# Patient Record
Sex: Male | Born: 1965 | Race: White | Hispanic: No | Marital: Married | Smoking: Current every day smoker
Health system: Southern US, Community
[De-identification: ages and names within clinical notes are randomized; demographics above are authoritative.]

## PROBLEM LIST (undated history)

## (undated) DIAGNOSIS — I10 Essential (primary) hypertension: Secondary | ICD-10-CM

## (undated) DIAGNOSIS — Z6831 Body mass index (BMI) 31.0-31.9, adult: Secondary | ICD-10-CM

## (undated) DIAGNOSIS — R079 Chest pain, unspecified: Secondary | ICD-10-CM

## (undated) DIAGNOSIS — R600 Localized edema: Secondary | ICD-10-CM

## (undated) DIAGNOSIS — I5030 Unspecified diastolic (congestive) heart failure: Secondary | ICD-10-CM

## (undated) DIAGNOSIS — I679 Cerebrovascular disease, unspecified: Secondary | ICD-10-CM

## (undated) DIAGNOSIS — L409 Psoriasis, unspecified: Secondary | ICD-10-CM

## (undated) DIAGNOSIS — F419 Anxiety disorder, unspecified: Secondary | ICD-10-CM

## (undated) DIAGNOSIS — M545 Low back pain, unspecified: Secondary | ICD-10-CM

## (undated) DIAGNOSIS — R001 Bradycardia, unspecified: Secondary | ICD-10-CM

## (undated) DIAGNOSIS — G4700 Insomnia, unspecified: Secondary | ICD-10-CM

## (undated) DIAGNOSIS — N400 Enlarged prostate without lower urinary tract symptoms: Secondary | ICD-10-CM

## (undated) DIAGNOSIS — G8929 Other chronic pain: Secondary | ICD-10-CM

## (undated) DIAGNOSIS — R55 Syncope and collapse: Secondary | ICD-10-CM

## (undated) DIAGNOSIS — I480 Paroxysmal atrial fibrillation: Secondary | ICD-10-CM

## (undated) DIAGNOSIS — M5090 Cervical disc disorder, unspecified, unspecified cervical region: Secondary | ICD-10-CM

## (undated) DIAGNOSIS — F3341 Major depressive disorder, recurrent, in partial remission: Secondary | ICD-10-CM

## (undated) HISTORY — DX: Essential (primary) hypertension: I10

## (undated) HISTORY — DX: Other chronic pain: G89.29

## (undated) HISTORY — DX: Anxiety disorder, unspecified: F41.9

## (undated) HISTORY — PX: HERNIA REPAIR: SHX51

## (undated) HISTORY — DX: Major depressive disorder, recurrent, in partial remission: F33.41

## (undated) HISTORY — PX: APPENDECTOMY: SHX54

## (undated) HISTORY — DX: Unspecified diastolic (congestive) heart failure: I50.30

## (undated) HISTORY — DX: Insomnia, unspecified: G47.00

## (undated) HISTORY — DX: Paroxysmal atrial fibrillation: I48.0

## (undated) HISTORY — DX: Low back pain, unspecified: M54.50

## (undated) HISTORY — PX: CHOLECYSTECTOMY: SHX55

## (undated) HISTORY — PX: WRIST SURGERY: SHX841

## (undated) HISTORY — DX: Psoriasis, unspecified: L40.9

## (undated) HISTORY — DX: Benign prostatic hyperplasia without lower urinary tract symptoms: N40.0

## (undated) HISTORY — DX: Body mass index (BMI) 31.0-31.9, adult: Z68.31

## (undated) HISTORY — DX: Localized edema: R60.0

## (undated) HISTORY — PX: KNEE ARTHROSCOPY: SUR90

## (undated) HISTORY — DX: Cerebrovascular disease, unspecified: I67.9

## (undated) HISTORY — PX: CERVICAL LAMINECTOMY: SHX94

---

## 1998-07-12 ENCOUNTER — Ambulatory Visit (HOSPITAL_COMMUNITY): Admission: RE | Admit: 1998-07-12 | Discharge: 1998-07-12 | Payer: Self-pay | Admitting: Gastroenterology

## 2009-10-16 HISTORY — PX: CARDIAC CATHETERIZATION: SHX172

## 2013-04-23 DIAGNOSIS — Z872 Personal history of diseases of the skin and subcutaneous tissue: Secondary | ICD-10-CM

## 2013-04-23 DIAGNOSIS — L4050 Arthropathic psoriasis, unspecified: Secondary | ICD-10-CM

## 2013-04-23 DIAGNOSIS — F17200 Nicotine dependence, unspecified, uncomplicated: Secondary | ICD-10-CM

## 2013-04-23 DIAGNOSIS — Z8739 Personal history of other diseases of the musculoskeletal system and connective tissue: Secondary | ICD-10-CM | POA: Insufficient documentation

## 2013-04-23 DIAGNOSIS — R002 Palpitations: Secondary | ICD-10-CM | POA: Insufficient documentation

## 2013-04-23 DIAGNOSIS — I4891 Unspecified atrial fibrillation: Secondary | ICD-10-CM

## 2013-04-23 HISTORY — DX: Arthropathic psoriasis, unspecified: L40.50

## 2013-04-23 HISTORY — DX: Nicotine dependence, unspecified, uncomplicated: F17.200

## 2013-04-25 ENCOUNTER — Ambulatory Visit (INDEPENDENT_AMBULATORY_CARE_PROVIDER_SITE_OTHER): Payer: BC Managed Care – PPO | Admitting: Cardiology

## 2013-04-25 ENCOUNTER — Encounter: Payer: Self-pay | Admitting: Cardiology

## 2013-04-25 VITALS — BP 126/78 | HR 62 | Ht 72.0 in | Wt 195.8 lb

## 2013-04-25 DIAGNOSIS — I4891 Unspecified atrial fibrillation: Secondary | ICD-10-CM

## 2013-04-25 DIAGNOSIS — F17200 Nicotine dependence, unspecified, uncomplicated: Secondary | ICD-10-CM

## 2013-04-25 MED ORDER — DILTIAZEM HCL ER COATED BEADS 240 MG PO CP24: 240.0000 mg | ORAL_CAPSULE | Freq: Every day | ORAL | Status: DC

## 2013-04-25 NOTE — Patient Instructions (Signed)
Avoid caffeine and decongestants (pseudophedrine)  Stop metoprolol.  Start cardizem CD 240 mg daily.  I will see you in a couple of months.

## 2013-04-25 NOTE — Progress Notes (Signed)
Wayne Pollard Date of Birth: 1966/05/20 Medical Record #[id number]  History of Present Illness: Wayne Pollard is self referred for evaluation of atrial fibrillation. He is a pleasant 47 year old white male who reports a one-year history of arrhythmia. He states he feels his heart racing and beating irregular. If he strains real hard several times it will go away. Typically his arrhythmia last several minutes up to 20 minutes. He did have an episode in April that lasted 24 hours and he was hospitalized. According to notes from his cardiologist in [place] his echocardiogram was unremarkable. Patient had had a cardiac catheterization a couple of years ago which was unremarkable. He was initially treated with beta blocker therapy. Last week he started on flecainide 50 mg twice a day. Since starting on this medication he has had only one fleeting episode of arrhythmia. He does feel very fatigued on metoprolol. Patient admits that he is very anxious concerning his rhythm. He has no known history of hypertension, diabetes, vascular disease, or stroke. [place] score is 0.  Current Outpatient Prescriptions on File Prior to Visit  Medication Sig Dispense Refill  . ALPRAZolam (XANAX) 0.5 MG tablet Take 0.5 mg by mouth 2 (two) times daily.      Marland Kitchen aspirin 325 MG tablet Take 325 mg by mouth daily.      . flecainide (TAMBOCOR) 50 MG tablet Take 50 mg by mouth 2 (two) times daily.      Marland Kitchen omeprazole (PRILOSEC OTC) 20 MG tablet Take 20 mg by mouth 2 (two) times daily.      . temazepam (RESTORIL) 15 MG capsule Take 15 mg by mouth at bedtime as needed for sleep.       No current facility-administered medications on file prior to visit.    Allergies  Allergen Reactions  . Codeine     Past Medical History  Diagnosis Date  . Anxiety   . Paroxysmal atrial fibrillation     Past Surgical History  Procedure Laterality Date  . Cardiac catheterization    . Appendectomy    . Cholecystectomy    . Hernia repair       History  Smoking status  . Former Smoker  . Quit date: 12/24/2012  Smokeless tobacco  . Not on file    History  Alcohol Use No    Family History  Problem Relation Age of Onset  . Hypertension Mother   . Heart attack Father   . Heart failure Father     Review of Systems: The review of systems is positive for fatigue. He does complain of anxiety. All other systems were reviewed and are negative.  Physical Exam: BP 126/78  Pulse 62  Ht 6' (1.829 m)  Wt 195 lb 12.8 oz (88.814 kg)  BMI 26.55 kg/m2 He is a pleasant white male in no acute distress. HEENT: Normocephalic, atraumatic. Pupils equal round and reactive to light accommodation. Extraocular movements are full. Oropharynx is clear with good dentition. Neck is supple no JVD, adenopathy, thyromegaly, or bruits. Lungs: Clear Cardiovascular: Regular rate and rhythm. Normal S1 and S2. No gallop or click. He has a soft grade 2/6 systolic ejection murmur heard best at the left sternal border and apex. PMI is normal. Abdomen: Soft and nontender. No masses organosplenomegaly. No bruits. Bowel sounds positive. Extremities: No cyanosis or edema. Pulses are 2+ and symmetric. Skin: Warm and dry Neuro: Alert and oriented x3. Cranial nerves II through XII are intact.  LABORATORY DATA: ECG today demonstrates normal sinus rhythm  with occasional PVC. Right atrial enlargement. Cannot rule out septal infarct age undetermined.  Assessment / Plan: 1. Paroxysmal atrial fibrillation. Apparently prior evaluation with lab work and echocardiogram were unremarkable. He does have a soft systolic ejection murmur. We will get a copy of his prior records for review. Since he has significant fatigue with metoprolol we will switch this to diltiazem 240 mg per day. I recommended continuing flecainide 50 mg twice a day. I recommended avoidance of stimulants including tobacco, caffeine, and decongestants. I'll followup again in 3 months to see how he's  doing on his antiarrhythmic drug therapy. We discussed that if he does have significant breakthrough he might be a candidate for ablative therapy.

## 2013-04-28 ENCOUNTER — Telehealth: Payer: Self-pay | Admitting: Cardiology

## 2013-04-28 NOTE — Telephone Encounter (Signed)
Pt Signed ROI Faxed to  1.[facility] [phone] 2.[facility] [phone] 7/14/4/KM

## 2013-04-29 ENCOUNTER — Telehealth: Payer: Self-pay | Admitting: Cardiology

## 2013-04-29 NOTE — Telephone Encounter (Signed)
Records Rec From [facility] gave to [facility]  04/29/13/KM

## 2013-05-21 ENCOUNTER — Telehealth: Payer: Self-pay | Admitting: Cardiology

## 2013-05-21 NOTE — Telephone Encounter (Signed)
Records REc From [facility], gave to [facility] 05/21/13/KM

## 2013-06-19 ENCOUNTER — Inpatient Hospital Stay (HOSPITAL_COMMUNITY): Payer: BC Managed Care – PPO

## 2013-06-19 ENCOUNTER — Observation Stay (HOSPITAL_COMMUNITY)
Admission: EM | Admit: 2013-06-19 | Discharge: 2013-06-20 | Disposition: A | Discharge status: Home / Self Care | Payer: BC Managed Care – PPO | Attending: Internal Medicine | Admitting: Internal Medicine

## 2013-06-19 ENCOUNTER — Encounter (HOSPITAL_COMMUNITY): Payer: Self-pay | Admitting: *Deleted

## 2013-06-19 ENCOUNTER — Emergency Department (HOSPITAL_COMMUNITY): Payer: BC Managed Care – PPO

## 2013-06-19 DIAGNOSIS — I4891 Unspecified atrial fibrillation: Principal | ICD-10-CM | POA: Insufficient documentation

## 2013-06-19 DIAGNOSIS — I498 Other specified cardiac arrhythmias: Secondary | ICD-10-CM | POA: Insufficient documentation

## 2013-06-19 DIAGNOSIS — R001 Bradycardia, unspecified: Secondary | ICD-10-CM

## 2013-06-19 DIAGNOSIS — R079 Chest pain, unspecified: Secondary | ICD-10-CM | POA: Insufficient documentation

## 2013-06-19 DIAGNOSIS — F17200 Nicotine dependence, unspecified, uncomplicated: Secondary | ICD-10-CM

## 2013-06-19 DIAGNOSIS — R0602 Shortness of breath: Secondary | ICD-10-CM | POA: Insufficient documentation

## 2013-06-19 DIAGNOSIS — R0789 Other chest pain: Secondary | ICD-10-CM | POA: Insufficient documentation

## 2013-06-19 DIAGNOSIS — R55 Syncope and collapse: Secondary | ICD-10-CM

## 2013-06-19 DIAGNOSIS — R002 Palpitations: Secondary | ICD-10-CM | POA: Diagnosis present

## 2013-06-19 DIAGNOSIS — I517 Cardiomegaly: Secondary | ICD-10-CM

## 2013-06-19 HISTORY — DX: Chest pain, unspecified: R07.9

## 2013-06-19 LAB — HEPATIC FUNCTION PANEL
ALT: 11 U/L (ref 0–53)
Albumin: 3.6 g/dL (ref 3.5–5.2)
Alkaline Phosphatase: 66 U/L (ref 39–117)
Total Bilirubin: 0.6 mg/dL (ref 0.3–1.2)
Total Protein: 6.9 g/dL (ref 6.0–8.3)

## 2013-06-19 LAB — CBC WITH DIFFERENTIAL/PLATELET
Eosinophils Absolute: 0.1 10*3/uL (ref 0.0–0.7)
Eosinophils Relative: 1 % (ref 0–5)
HCT: 41.4 % (ref 39.0–52.0)
Hemoglobin: 14.9 g/dL (ref 13.0–17.0)
Lymphocytes Relative: 19 % (ref 12–46)
Lymphs Abs: 1.8 10*3/uL (ref 0.7–4.0)
MCH: 32.3 pg (ref 26.0–34.0)
MCV: 89.6 fL (ref 78.0–100.0)
Monocytes Absolute: 0.5 10*3/uL (ref 0.1–1.0)
Monocytes Relative: 5 % (ref 3–12)
RBC: 4.62 MIL/uL (ref 4.22–5.81)
WBC: 9.3 10*3/uL (ref 4.0–10.5)

## 2013-06-19 LAB — CBC
MCH: 32.5 pg (ref 26.0–34.0)
MCV: 89.8 fL (ref 78.0–100.0)
Platelets: 175 10*3/uL (ref 150–400)
RDW: 12.4 % (ref 11.5–15.5)
WBC: 10.1 10*3/uL (ref 4.0–10.5)

## 2013-06-19 LAB — POCT I-STAT TROPONIN I: Troponin i, poc: 0 ng/mL (ref 0.00–0.08)

## 2013-06-19 LAB — BASIC METABOLIC PANEL
BUN: 11 mg/dL (ref 6–23)
CO2: 25 mEq/L (ref 19–32)
Calcium: 9.2 mg/dL (ref 8.4–10.5)
GFR calc non Af Amer: >90 mL/min (ref >90)
Glucose, Bld: 90 mg/dL (ref 70–99)

## 2013-06-19 LAB — TROPONIN I
Troponin I: <0.3 ng/mL (ref <0.30)
Troponin I: <0.3 ng/mL (ref <0.30)

## 2013-06-19 MED ORDER — ALPRAZOLAM 0.25 MG PO TABS
0.5000 mg | ORAL_TABLET | Freq: Two times a day (BID) | ORAL | Status: DC | PRN
  Administered 2013-06-19 – 2013-06-20 (×2): 0.5 mg via ORAL
  Filled 2013-06-19 (×2): qty 2

## 2013-06-19 MED ORDER — TEMAZEPAM 15 MG PO CAPS
15.0000 mg | ORAL_CAPSULE | Freq: Every evening | ORAL | Status: DC | PRN
  Administered 2013-06-19: 15 mg via ORAL
  Filled 2013-06-19: qty 1

## 2013-06-19 MED ORDER — SODIUM CHLORIDE 0.9 % IJ SOLN
3.0000 mL | Freq: Two times a day (BID) | INTRAMUSCULAR | Status: DC
  Administered 2013-06-19: 3 mL via INTRAVENOUS

## 2013-06-19 MED ORDER — ONDANSETRON HCL 4 MG/2ML IJ SOLN: 4.0000 mg | Freq: Four times a day (QID) | INTRAMUSCULAR | Status: DC | PRN

## 2013-06-19 MED ORDER — ONDANSETRON HCL 4 MG PO TABS: 4.0000 mg | ORAL_TABLET | Freq: Four times a day (QID) | ORAL | Status: DC | PRN

## 2013-06-19 MED ORDER — ASPIRIN 325 MG PO TABS
325.0000 mg | ORAL_TABLET | Freq: Every day | ORAL | Status: DC
  Filled 2013-06-19: qty 1

## 2013-06-19 MED ORDER — ACETAMINOPHEN 325 MG PO TABS
650.0000 mg | ORAL_TABLET | Freq: Four times a day (QID) | ORAL | Status: DC | PRN
  Administered 2013-06-20: 650 mg via ORAL
  Filled 2013-06-19: qty 2

## 2013-06-19 MED ORDER — NITROGLYCERIN 0.3 MG SL SUBL
0.3000 mg | SUBLINGUAL_TABLET | SUBLINGUAL | Status: DC | PRN
  Filled 2013-06-19: qty 100

## 2013-06-19 MED ORDER — MORPHINE SULFATE 2 MG/ML IJ SOLN: 1.0000 mg | INTRAMUSCULAR | Status: DC | PRN

## 2013-06-19 MED ORDER — FLECAINIDE ACETATE 50 MG PO TABS
50.0000 mg | ORAL_TABLET | Freq: Two times a day (BID) | ORAL | Status: DC
  Filled 2013-06-19 (×4): qty 1

## 2013-06-19 MED ORDER — MORPHINE SULFATE 4 MG/ML IJ SOLN
4.0000 mg | Freq: Once | INTRAMUSCULAR | Status: AC
  Administered 2013-06-19: 4 mg via INTRAVENOUS
  Filled 2013-06-19: qty 1

## 2013-06-19 MED ORDER — HYDROMORPHONE HCL PF 1 MG/ML IJ SOLN
1.0000 mg | INTRAMUSCULAR | Status: DC | PRN
  Administered 2013-06-19: 1 mg via INTRAVENOUS
  Filled 2013-06-19: qty 1

## 2013-06-19 MED ORDER — MAGNESIUM HYDROXIDE 400 MG/5ML PO SUSP: 30.0000 mL | Freq: Every day | ORAL | Status: DC | PRN

## 2013-06-19 MED ORDER — PANTOPRAZOLE SODIUM 40 MG PO TBEC
40.0000 mg | DELAYED_RELEASE_TABLET | Freq: Every day | ORAL | Status: DC
  Administered 2013-06-19 – 2013-06-20 (×2): 40 mg via ORAL
  Filled 2013-06-19 (×2): qty 1

## 2013-06-19 MED ORDER — ONDANSETRON HCL 4 MG/2ML IJ SOLN
4.0000 mg | Freq: Once | INTRAMUSCULAR | Status: AC
  Administered 2013-06-19: 4 mg via INTRAVENOUS
  Filled 2013-06-19: qty 2

## 2013-06-19 MED ORDER — ACETAMINOPHEN 650 MG RE SUPP: 650.0000 mg | Freq: Four times a day (QID) | RECTAL | Status: DC | PRN

## 2013-06-19 MED ORDER — ONDANSETRON HCL 4 MG/2ML IJ SOLN: 4.0000 mg | Freq: Three times a day (TID) | INTRAMUSCULAR | Status: DC | PRN

## 2013-06-19 MED ORDER — HEPARIN SODIUM (PORCINE) 5000 UNIT/ML IJ SOLN
5000.0000 [IU] | Freq: Three times a day (TID) | INTRAMUSCULAR | Status: DC
  Administered 2013-06-19 – 2013-06-20 (×2): 5000 [IU] via SUBCUTANEOUS
  Filled 2013-06-19 (×6): qty 1

## 2013-06-19 MED ORDER — DILTIAZEM HCL ER COATED BEADS 240 MG PO CP24
240.0000 mg | ORAL_CAPSULE | Freq: Every day | ORAL | Status: DC
  Administered 2013-06-19: 240 mg via ORAL
  Filled 2013-06-19 (×2): qty 1

## 2013-06-19 NOTE — Progress Notes (Addendum)
Bilateral carotid artery duplex completed.  1-39% ICA stenosis and vertebral artery flow is antegrade.

## 2013-06-19 NOTE — ED Notes (Signed)
Called CT and they were already on their way to pick up pt.

## 2013-06-19 NOTE — Progress Notes (Signed)
Utilization Review Completed.Wayne Pollard T9/01/2013

## 2013-06-19 NOTE — Consult Note (Signed)
Reason for Consult:Syncope, chest pain Referring Physician: Dr. Laural Benes Primary cardiologist: Dr. Peter Swaziland  Wayne Pollard is an 47 y.o. male.  HPI: This pleasant 47 year old gentleman was admitted this morning after experiencing severe left-sided chest pain while at work.  Early this morning when in bed he felt that his heart had gone out of rhythm.  He did a Valsalva and the heart rhythm returned to normal.  However he did not feel well as he prepared to go to work.  He felt tired.  After arriving at work he had sudden onset of severe left-sided chest discomfort.  He also felt that his heart had gone back out of rhythm.  Because of this he performed several hard intense Valsalva maneuvers.  He felt that the arrhythmia stopped but then the chest discomfort recurred and he started to do further Valsalva's and then became dizzy and had syncope.  He thinks that he was unconscious for about 5 minutes.  He awoke and was surrounded by EMS personnel.  No arrhythmia was documented at the time.  He was brought to the emergency room here.  He was found to be in normal sinus rhythm. His past cardiac history reveals that on 04/27/10 he was rushed to [facility] in [place] with chest pain and taken emergently to the catheter lab where he was found to have normal coronary arteries and an ejection fraction of 60%.  Dr. Woodfin Ganja was his cardiologist. On 01/06/13 the patient had an echocardiogram at [facility] in [place], [place].  The echocardiogram noted that he was in atrial fibrillation and that he had normal left ventricular function with ejection fraction of 60-65% with mild LVH.  There was mild left atrial enlargement.  He states that when he ago he had a stress test in [place] which was normal. The patient was seen self-referred in Dr. Theron Arista Jordan's office for the first time on 04/25/13.  At that time he gave a one-year history of arrhythmia.  He gives a history that he  does a hard of Valsalva maneuver for several times his rhythm will usually revert to normal.  Typically  his episodes of arrhythmia last several minutes up to 20 minutes in duration.  When he saw Dr. Swaziland on 04/25/13 he was complaining of fatigue which was attributed to his metoprolol and at that point he was switched from metoprolol to diltiazem 240 mg daily.  He had previously been on flecainide 50 mg twice a day and he has continued that. Past Medical History  Diagnosis Date  . Anxiety   . Paroxysmal atrial fibrillation     Past Surgical History  Procedure Laterality Date  . Appendectomy    . Cholecystectomy    . Hernia repair    . Cardiac catheterization      Family History  Problem Relation Age of Onset  . Hypertension Mother   . Heart attack Father   . Heart failure Father     Social History:  reports that he quit smoking about 5 months ago. His smoking use included Cigarettes. He smoked 0.00 packs per day. He does not have any smokeless tobacco history on file. He reports that he does not drink alcohol or use illicit drugs.  Allergies:  Allergies  Allergen Reactions  . Codeine     Medications:  Prior to Admission:  Prescriptions prior to admission  Medication Sig Dispense Refill  . ALPRAZolam (XANAX) 0.5 MG tablet Take 0.5 mg by mouth 2 (two) times  daily as needed.       Marland Kitchen aspirin 325 MG tablet Take 325 mg by mouth daily.      Marland Kitchen diltiazem (CARDIZEM CD) 240 MG 24 hr capsule Take 1 capsule (240 mg total) by mouth daily.  90 capsule  3  . flecainide (TAMBOCOR) 50 MG tablet Take 50 mg by mouth 2 (two) times daily.      Marland Kitchen omeprazole (PRILOSEC OTC) 20 MG tablet Take 20 mg by mouth 2 (two) times daily.      . temazepam (RESTORIL) 15 MG capsule Take 15 mg by mouth at bedtime as needed for sleep.        Results for orders placed during the hospital encounter of 06/19/13 (from the past 48 hour(s))  POCT I-STAT TROPONIN I     Status: None   Collection Time    06/19/13 10:16  AM      Result Value Range   Troponin i, poc 0.00  0.00 - 0.08 ng/mL   Comment 3            Comment: Due to the release kinetics of cTnI,     a negative result within the first hours     of the onset of symptoms does not rule out     myocardial infarction with certainty.     If myocardial infarction is still suspected,     repeat the test at appropriate intervals.  CBC WITH DIFFERENTIAL     Status: None   Collection Time    06/19/13 10:20 AM      Result Value Range   WBC 9.3  4.0 - 10.5 K/uL   RBC 4.62  4.22 - 5.81 MIL/uL   Hemoglobin 14.9  13.0 - 17.0 g/dL   HCT 16.1  09.6 - 04.5 %   MCV 89.6  78.0 - 100.0 fL   MCH 32.3  26.0 - 34.0 pg   MCHC 36.0  30.0 - 36.0 g/dL   RDW 40.9  81.1 - 91.4 %   Platelets 172  150 - 400 K/uL   Neutrophils Relative % 75  43 - 77 %   Neutro Abs 7.0  1.7 - 7.7 K/uL   Lymphocytes Relative 19  12 - 46 %   Lymphs Abs 1.8  0.7 - 4.0 K/uL   Monocytes Relative 5  3 - 12 %   Monocytes Absolute 0.5  0.1 - 1.0 K/uL   Eosinophils Relative 1  0 - 5 %   Eosinophils Absolute 0.1  0.0 - 0.7 K/uL   Basophils Relative 0  0 - 1 %   Basophils Absolute 0.0  0.0 - 0.1 K/uL  BASIC METABOLIC PANEL     Status: None   Collection Time    06/19/13 10:20 AM      Result Value Range   Sodium 137  135 - 145 mEq/L   Potassium 3.8  3.5 - 5.1 mEq/L   Chloride 104  96 - 112 mEq/L   CO2 25  19 - 32 mEq/L   Glucose, Bld 90  70 - 99 mg/dL   BUN 11  6 - 23 mg/dL   Creatinine, Ser 7.82  0.50 - 1.35 mg/dL   Calcium 9.2  8.4 - 95.6 mg/dL   GFR calc non Af Amer >90  >90 mL/min   GFR calc Af Amer >90  >90 mL/min   Comment: (NOTE)     The eGFR has been calculated using the CKD EPI equation.  This calculation has not been validated in all clinical situations.     eGFR's persistently <90 mL/min signify possible Chronic Kidney     Disease.  PRO B NATRIURETIC PEPTIDE     Status: None   Collection Time    06/19/13 10:20 AM      Result Value Range   Pro B Natriuretic peptide  (BNP) 54.5  0 - 125 pg/mL  HEPATIC FUNCTION PANEL     Status: None   Collection Time    06/19/13 10:20 AM      Result Value Range   Total Protein 6.9  6.0 - 8.3 g/dL   Albumin 3.6  3.5 - 5.2 g/dL   AST 13  0 - 37 U/L   ALT 11  0 - 53 U/L   Alkaline Phosphatase 66  39 - 117 U/L   Total Bilirubin 0.6  0.3 - 1.2 mg/dL   Bilirubin, Direct <0.9  0.0 - 0.3 mg/dL   Comment: REPEATED TO VERIFY   Indirect Bilirubin NOT CALCULATED  0.3 - 0.9 mg/dL  POCT I-STAT TROPONIN I     Status: None   Collection Time    06/19/13  2:42 PM      Result Value Range   Troponin i, poc 0.01  0.00 - 0.08 ng/mL   Comment 3            Comment: Due to the release kinetics of cTnI,     a negative result within the first hours     of the onset of symptoms does not rule out     myocardial infarction with certainty.     If myocardial infarction is still suspected,     repeat the test at appropriate intervals.  CBC     Status: Abnormal   Collection Time    06/19/13  4:08 PM      Result Value Range   WBC 10.1  4.0 - 10.5 K/uL   RBC 4.59  4.22 - 5.81 MIL/uL   Hemoglobin 14.9  13.0 - 17.0 g/dL   HCT 60.4  54.0 - 98.1 %   MCV 89.8  78.0 - 100.0 fL   MCH 32.5  26.0 - 34.0 pg   MCHC 36.2 (*) 30.0 - 36.0 g/dL   RDW 19.1  47.8 - 29.5 %   Platelets 175  150 - 400 K/uL  CREATININE, SERUM     Status: None   Collection Time    06/19/13  4:08 PM      Result Value Range   Creatinine, Ser 0.79  0.50 - 1.35 mg/dL   GFR calc non Af Amer >90  >90 mL/min   GFR calc Af Amer >90  >90 mL/min   Comment: (NOTE)     The eGFR has been calculated using the CKD EPI equation.     This calculation has not been validated in all clinical situations.     eGFR's persistently <90 mL/min signify possible Chronic Kidney     Disease.  TROPONIN I     Status: None   Collection Time    06/19/13  4:12 PM      Result Value Range   Troponin I <0.30  <0.30 ng/mL   Comment:            Due to the release kinetics of cTnI,     a negative  result within the first hours     of the onset of symptoms does not rule out  myocardial infarction with certainty.     If myocardial infarction is still suspected,     repeat the test at appropriate intervals.    Dg Chest 2 View  06/19/2013   *RADIOLOGY REPORT*  Clinical Data: Cardiac palpitations  CHEST - 2 VIEW  Comparison:  January 06, 2013  Findings: There is no edema or consolidation.  Heart size and pulmonary vascularity are normal.  No adenopathy.  No bone lesions.  IMPRESSION: No abnormality noted.   Original Report Authenticated By: Bretta Bang, M.D.   Ct Head Wo Contrast  06/19/2013   *RADIOLOGY REPORT*  Clinical Data: Palpitations, syncope  CT HEAD WITHOUT CONTRAST  Technique:  Contiguous axial images were obtained from the base of the skull through the vertex without contrast.  Comparison: Prior head CT 05/31/2004  Findings: No acute intracranial hemorrhage, acute infarction, mass lesion, mass effect, midline shift or hydrocephalus.  Gray-white differentiation is preserved throughout. No focal soft tissue or calvarial abnormality.  The globes and orbits are symmetric and unremarkable bilaterally.  Normal aeration of the mastoid air cells and the majority the paranasal sinuses.  Bilateral contra bullosa incidentally noted.  Mild mucoperiosteal thickening and scattered central ethmoid air cells.  IMPRESSION: Negative head CT.   Original Report Authenticated By: Malachy Moan, M.D.    Review of systems is negative except for present illness Blood pressure 98/58, pulse 51, temperature 98 F (36.7 C), temperature source Oral, resp. rate 18, height 6' (1.829 m), weight 185 lb 6.5 oz (84.1 kg), SpO2 98.00%. subsequent blood pressures have been normal. The patient appears to be in no distress.  Head and neck exam reveals that the pupils are equal and reactive.  The extraocular movements are full.  There is no scleral icterus.  Mouth and pharynx are benign.  No lymphadenopathy.  No  carotid bruits.  The jugular venous pressure is normal.  Thyroid is not enlarged or tender.  Chest is clear to percussion and auscultation.  No rales or rhonchi.  Expansion of the chest is symmetrical.  Heart reveals no abnormal lift or heave.  First and second heart sounds are normal.  There is no  gallop rub or click.  There is a soft systolic murmur at the base.  The abdomen is soft and nontender.  Bowel sounds are normoactive.  There is no hepatosplenomegaly or mass.  There are no abdominal bruits.  Extremities reveal no phlebitis or edema.  Pedal pulses are good.  There is no cyanosis or clubbing.  Neurologic exam is normal strength and no lateralizing weakness.  No sensory deficits.  Integument reveals no rash. EKG done on 06/19/13: Sinus rhythm Anteroseptal infarct, old Borderline T abnormalities, inferior leads. The nonspecific inferolateral T wave changes are not significantly different from previous tracing 04/25/13  Telemetry shows sinus bradycardia at 55 /minute  Two-dimensional echocardiogram 06/19/13: Study Conclusions  - Left ventricle: The cavity size was normal. Wall thickness was increased in a pattern of mild LVH. Systolic function was normal. The estimated ejection fraction was in the range of 55% to 60%. Wall motion was normal; there were no regional wall motion abnormalities. - Aortic valve: Moderately calcified, trileaflet with some fusion of left and right cusp. Mean gradient: 17mm Hg (S). Peak gradient: 39mm Hg (S). AVA 1.9 cm^2. - Mitral valve: No significant regurgitation. - Left atrium: The atrium was mildly dilated. - Right ventricle: The cavity size was normal. Systolic function was normal. - Right atrium: The atrium was mildly dilated. - Pulmonary arteries: No complete  TR doppler jet so unable to estimate PA systolic pressure. - Systemic veins: IVC measured 1.9 cm with some respirophasic variation, suggesting RA pressure 6-10  mmHg. Impressions:  - Normal LV size with mild LV hypertrophy. EF 55-60%. Normal RV size and systolic function. Mild aortic stenosis.   Assessment/Plan: 1. atypical chest pain in a patient with normal coronary arteries by catheterization in 2011 and reportedly normal stress test one year ago. 2. past history of paroxysmal atrial fibrillation.  Question whether some of these brief episodes may be SVT rather than atrial fibrillation since they do seem to respond to Valsalva maneuver by history.  Patient states that he has never worn an event monitor to document the episodes more comprehensively. 3.  Syncopal episode may have been triggered by excessive Valsalva maneuver.  Recommend: Cycle cardiac enzymes overnight.  Repeat EKG tomorrow morning.  If enzymes and EKG remained stable he will probably not require any further ischemic testing.  Consider outpatient event monitor to further characterize his arrhythmias.  Cassell Clement 06/19/2013, 5:11 PM

## 2013-06-19 NOTE — ED Notes (Signed)
Pt c/o increasing frontal headache.  Denies nausea or blurred vision.  Dr Manus Gunning notified.

## 2013-06-19 NOTE — H&P (Signed)
History and Physical Examination   Wayne Pollard XBJ:[id number] DOB: 10/10/1966 DOA: 06/19/2013  PCP: Raquel Sarna, MD  Specialists: Peter [place] MD cardiology  Chief Complaint: passed out  HPI: Wayne Pollard is a 47 y.o. male with a one-year history of paroxysmal atrial fibrillation reported that he woke up this morning with palpitations and performed a Valsalva maneuver which improved the symptoms.  He went back to sleep and subsequently went to work.  He did not feel good this morning.  He's felt a tightness in his chest and malaise.  Patient has been taking all medications for atrial fibrillation including flecainide as directed and has not missed any doses. Patient went to work and at 6:30 AM had a severe left-sided pressure-like chest pain radiating to the left jaw associated with shortness of breath nausea and diaphoresis. Patient was at work at the time, he passed out completely and was found may have been out for up to 5 minutes and was awakened to find EMS services present.. Patient had full dose aspirin and EMS transported him to ER. Pt says he feels extremely fatigued.  He was given nitroglycerin in the emergency department he reports that that improved and resolved the chest pain.  He's now complaining of severe headache.  He had a negative CT of head study done.  He is a former smoker but quit last year after smoking for 12 years.  His father has a history of heart disease.  Hospital admission was requested for further evaluation of this syncopal episode and subsequent chest pain.     Past Medical History Past Medical History  Diagnosis Date  . Anxiety   . Paroxysmal atrial fibrillation     Past Surgical History Past Surgical History  Procedure Laterality Date  . Appendectomy    . Cholecystectomy    . Hernia repair    . Cardiac catheterization      Home Meds: Prior to Admission medications   Medication Sig Start Date End Date Taking? Authorizing Provider  ALPRAZolam  Prudy Feeler) 0.5 MG tablet Take 0.5 mg by mouth 2 (two) times daily as needed.    Yes Historical Provider, MD  aspirin 325 MG tablet Take 325 mg by mouth daily.   Yes Historical Provider, MD  diltiazem (CARDIZEM CD) 240 MG 24 hr capsule Take 1 capsule (240 mg total) by mouth daily. 04/25/13  Yes Peter M [place], MD  flecainide (TAMBOCOR) 50 MG tablet Take 50 mg by mouth 2 (two) times daily.   Yes Historical Provider, MD  omeprazole (PRILOSEC OTC) 20 MG tablet Take 20 mg by mouth 2 (two) times daily.   Yes Historical Provider, MD  temazepam (RESTORIL) 15 MG capsule Take 15 mg by mouth at bedtime as needed for sleep.   Yes Historical Provider, MD   Allergies: Codeine  Social History:  History   Social History  . Marital Status: Married    Spouse Name: N/A    Number of Children: 2  . Years of Education: N/A   Occupational History  . communications    Social History Main Topics  . Smoking status: Former Smoker    Types: Cigarettes    Quit date: 12/24/2012  . Smokeless tobacco: Not on file  . Alcohol Use: No  . Drug Use: No  . Sexual Activity: Not on file   Other Topics Concern  . Not on file   Social History Narrative  . No narrative on file   Family History:  Family History  Problem Relation Age of Onset  . Hypertension Mother   . Heart attack Father   . Heart failure Father     Review of Systems: The patient denies anorexia, fever, weight loss,, vision loss, decreased hearing, hoarseness, chest pain, syncope, dyspnea on exertion, peripheral edema, balance deficits, hemoptysis, abdominal pain, melena, hematochezia, severe indigestion/heartburn, hematuria, incontinence, genital sores, muscle weakness, suspicious skin lesions, transient blindness, difficulty walking, depression, unusual weight change, abnormal bleeding, enlarged lymph nodes, angioedema, and breast masses.   All other systems reviewed and reported as negative.   Physical Exam: Blood pressure 98/58, pulse 51,  temperature 98 F (36.7 C), temperature source Oral, resp. rate 18, height 6' (1.829 m), weight 84.1 kg (185 lb 6.5 oz), SpO2 98.00%. General appearance: alert, cooperative and appears stated age Head: Normocephalic, without obvious abnormality, atraumatic Eyes: conjunctivae/corneas clear. PERRL, EOM's intact. Fundi benign. Nose: Nares normal. Septum midline. Mucosa normal. No drainage or sinus tenderness., no discharge Throat: lips, mucosa, and tongue normal; teeth and gums normal Neck: no adenopathy, no carotid bruit, no JVD, supple, symmetrical, trachea midline and thyroid not enlarged, symmetric, no tenderness/mass/nodules Back: symmetric, no curvature. ROM normal. No CVA tenderness. Lungs: clear to auscultation bilaterally and normal percussion bilaterally Chest wall: no tenderness Heart: regular rate and rhythm, S1, S2 normal, no murmur, click, rub or gallop Abdomen: soft, non-tender; bowel sounds normal; no masses,  no organomegaly Extremities: extremities normal, atraumatic, no cyanosis or edema Pulses: 2+ and symmetric Skin: Skin color, texture, turgor normal. No rashes or lesions Neurologic: Alert and oriented X 3, normal strength and tone. Normal symmetric reflexes. Normal coordination and gait  Lab  And Imaging results:  Results for orders placed during the hospital encounter of 06/19/13 (from the past 24 hour(s))  POCT I-STAT TROPONIN I     Status: None   Collection Time    06/19/13 10:16 AM      Result Value Range   Troponin i, poc 0.00  0.00 - 0.08 ng/mL   Comment 3           CBC WITH DIFFERENTIAL     Status: None   Collection Time    06/19/13 10:20 AM      Result Value Range   WBC 9.3  4.0 - 10.5 K/uL   RBC 4.62  4.22 - 5.81 MIL/uL   Hemoglobin 14.9  13.0 - 17.0 g/dL   HCT 16.1  09.6 - 04.5 %   MCV 89.6  78.0 - 100.0 fL   MCH 32.3  26.0 - 34.0 pg   MCHC 36.0  30.0 - 36.0 g/dL   RDW 40.9  81.1 - 91.4 %   Platelets 172  150 - 400 K/uL   Neutrophils Relative % 75   43 - 77 %   Neutro Abs 7.0  1.7 - 7.7 K/uL   Lymphocytes Relative 19  12 - 46 %   Lymphs Abs 1.8  0.7 - 4.0 K/uL   Monocytes Relative 5  3 - 12 %   Monocytes Absolute 0.5  0.1 - 1.0 K/uL   Eosinophils Relative 1  0 - 5 %   Eosinophils Absolute 0.1  0.0 - 0.7 K/uL   Basophils Relative 0  0 - 1 %   Basophils Absolute 0.0  0.0 - 0.1 K/uL  BASIC METABOLIC PANEL     Status: None   Collection Time    06/19/13 10:20 AM      Result Value Range   Sodium 137  135 - 145  mEq/L   Potassium 3.8  3.5 - 5.1 mEq/L   Chloride 104  96 - 112 mEq/L   CO2 25  19 - 32 mEq/L   Glucose, Bld 90  70 - 99 mg/dL   BUN 11  6 - 23 mg/dL   Creatinine, Ser 4.54  0.50 - 1.35 mg/dL   Calcium 9.2  8.4 - 09.8 mg/dL   GFR calc non Af Amer >90  >90 mL/min   GFR calc Af Amer >90  >90 mL/min  PRO B NATRIURETIC PEPTIDE     Status: None   Collection Time    06/19/13 10:20 AM      Result Value Range   Pro B Natriuretic peptide (BNP) 54.5  0 - 125 pg/mL  HEPATIC FUNCTION PANEL     Status: None   Collection Time    06/19/13 10:20 AM      Result Value Range   Total Protein 6.9  6.0 - 8.3 g/dL   Albumin 3.6  3.5 - 5.2 g/dL   AST 13  0 - 37 U/L   ALT 11  0 - 53 U/L   Alkaline Phosphatase 66  39 - 117 U/L   Total Bilirubin 0.6  0.3 - 1.2 mg/dL   Bilirubin, Direct <1.1  0.0 - 0.3 mg/dL   Indirect Bilirubin NOT CALCULATED  0.3 - 0.9 mg/dL  POCT I-STAT TROPONIN I     Status: None   Collection Time    06/19/13  2:42 PM      Result Value Range   Troponin i, poc 0.01  0.00 - 0.08 ng/mL   Comment 3            Impression / Plan  Paroxysmal Atrial fibrillation -resume home medications at this time - [place] score 0 - consult cardiology services for evaluation - continuous telemetry monitoring - check TSH  Palpitations-as above    Chest pain -Cycle cardiac enzymes to rule out myocardial ischemia -Morphine IV for pain -Oxygen as needed for supplementation -Cardiac consult as above -Repeat EKG in a.m. -Check  d-dimer  Syncope -Check echocardiogram and carotid Doppler studies -Orthostatic vital signs recommended and ordered    Standley Dakins MD [facility] [place], [place] [phone] 06/19/2013, 3:39 PM

## 2013-06-19 NOTE — ED Provider Notes (Signed)
CSN: [id number]     Arrival date & time 06/19/13  0946 History   First MD Initiated Contact with Patient 06/19/13 [phone]     Chief Complaint  Patient presents with  . Palpitations  . Loss of Consciousness   (Consider location/radiation/quality/duration/timing/severity/associated sxs/prior Treatment) HPI  Wayne Pollard is a 47 y.o. male complaining of being awoken from sleep last night at approximately 2:30 AM with the sensation of heart racing and palpitations associated with shortness of breath and anxiety. Patient has been taking all medications including flecainide as directed and has not missed any doses. Patient states that he valsalva'd and the sensation of palpitations passed, states this has worked for him in the past. Patient went to work and at 6:30 AM had a severe left-sided pressure-like chest pain radiating to the left jaw associated with shortness of breath nausea and diaphoresis. Patient was at work at the time, he syncopized and was found may have been out for up to 5 minutes. Patient had full dose aspirin and EMS transported him. His chest pain-free at this time but states that he has a headache and feels just extremely fatigued.  Cards: [place]  RF: Former smoker Clean cath in 2011 from [facility] and echo from [facility] and March of 2014 shows LVH with A. fib and normal ejection fraction  Past Medical History  Diagnosis Date  . Anxiety   . Paroxysmal atrial fibrillation    Past Surgical History  Procedure Laterality Date  . Appendectomy    . Cholecystectomy    . Hernia repair    . Cardiac catheterization     Family History  Problem Relation Age of Onset  . Hypertension Mother   . Heart attack Father   . Heart failure Father    History  Substance Use Topics  . Smoking status: Former Smoker    Quit date: 12/24/2012  . Smokeless tobacco: Not on file  . Alcohol Use: No    Review of Systems 10 systems reviewed and found to be negative, except as noted  in the HPI  Allergies  Codeine  Home Medications   Current Outpatient Rx  Name  Route  Sig  Dispense  Refill  . ALPRAZolam (XANAX) 0.5 MG tablet   Oral   Take 0.5 mg by mouth 2 (two) times daily.         Marland Kitchen aspirin 325 MG tablet   Oral   Take 325 mg by mouth daily.         Marland Kitchen diltiazem (CARDIZEM CD) 240 MG 24 hr capsule   Oral   Take 1 capsule (240 mg total) by mouth daily.   90 capsule   3   . flecainide (TAMBOCOR) 50 MG tablet   Oral   Take 50 mg by mouth 2 (two) times daily.         Marland Kitchen omeprazole (PRILOSEC OTC) 20 MG tablet   Oral   Take 20 mg by mouth 2 (two) times daily.         . temazepam (RESTORIL) 15 MG capsule   Oral   Take 15 mg by mouth at bedtime as needed for sleep.          BP 136/84  Temp(Src) 98 F (36.7 C) (Oral)  Resp 15  SpO2 97% Physical Exam  Nursing note and vitals reviewed. Constitutional: He is oriented to person, place, and time. He appears well-developed and well-nourished. No distress.  HENT:  Head: Normocephalic.  Mouth/Throat: Oropharynx is clear  and moist.  Eyes: Conjunctivae and EOM are normal. Pupils are equal, round, and reactive to light.  Neck: Normal range of motion. No JVD present.  Cardiovascular: Normal rate, regular rhythm and intact distal pulses.   Pulmonary/Chest: Effort normal and breath sounds normal. No stridor. No respiratory distress. He has no wheezes. He has no rales. He exhibits no tenderness.  Abdominal: Soft. Bowel sounds are normal. He exhibits no distension. There is no tenderness. There is no rebound and no guarding.  Musculoskeletal: Normal range of motion. He exhibits no edema.  No calf asymmetry, superficial collaterals, palpable cords, edema, Homans sign negative bilaterally.    Neurological: He is alert and oriented to person, place, and time.  Psychiatric: He has a normal mood and affect.    Date: 06/19/2013  Rate: 58  Rhythm: normal sinus rhythm  QRS Axis: normal  Intervals: normal   ST/T Wave abnormalities: nonspecific T wave changes  Conduction Disutrbances:none  Narrative Interpretation:   Old EKG Reviewed: unchanged    ED Course  Procedures (including critical care time) Labs Review Labs Reviewed  CBC WITH DIFFERENTIAL  BASIC METABOLIC PANEL  PRO B NATRIURETIC PEPTIDE  HEPATIC FUNCTION PANEL  POCT I-STAT TROPONIN I   Imaging Review Dg Chest 2 View  06/19/2013   *RADIOLOGY REPORT*  Clinical Data: Cardiac palpitations  CHEST - 2 VIEW  Comparison:  January 06, 2013  Findings: There is no edema or consolidation.  Heart size and pulmonary vascularity are normal.  No adenopathy.  No bone lesions.  IMPRESSION: No abnormality noted.   Original Report Authenticated By: Bretta Bang, M.D.    MDM   1. Syncope    Filed Vitals:   06/19/13 1026 06/19/13 1030 06/19/13 1045 06/19/13 1130  BP: 135/72 147/89 141/75 140/81  Pulse:  51 56 52  Temp:      TempSrc:      Resp: 16 16 12 25   SpO2: 96% 98% 97% 97%     Wayne Pollard is a 47 y.o. male with paroxysmal A. fib complaining of left-sided pressure-like chest pain radiating to the  left jaw associated with shortness of breath, nausea, diaphoresis and syncopal episode this a.m.  EKG is nonischemic normal sinus rhythm, chest x-ray shows no abnormalities, troponin and BNP are negative. Blood work is otherwise unremarkable.   She will be admitted to a telemetry bed under the care of [facility] Dr. Laural Benes.   Note: Portions of this report may have been transcribed using voice recognition software. Every effort was made to ensure accuracy; however, inadvertent computerized transcription errors may be present      Wynetta Emery, PA-C 06/19/13 1237

## 2013-06-19 NOTE — ED Notes (Signed)
Pt back from CT and notified of neg CT results..  States dilaudid only minimally reduced headache.

## 2013-06-19 NOTE — Progress Notes (Signed)
*  PRELIMINARY RESULTS* Echocardiogram 2D Echocardiogram has been performed.  Wayne Pollard 06/19/2013, 5:04 PM

## 2013-06-19 NOTE — Progress Notes (Signed)
Notified MD Claiborne Billings of bradycardia; given an order to hold Tambocor tonight

## 2013-06-19 NOTE — ED Notes (Signed)
Pt with hx of afib experienced palpitations last night.  He went to work and co-workers found him head down on desk (short amount of time).  Pt c/o sob, nausea and intermittent L chest, shoulder and jaw pain. Pt given 2 nitro with no relief by ems and took 325 mg asa at  home.

## 2013-06-19 NOTE — ED Notes (Signed)
Family at bedside. 

## 2013-06-19 NOTE — ED Provider Notes (Signed)
Medical screening examination/treatment/procedure(s) were conducted as a shared visit with non-physician practitioner(s) and myself.  I personally evaluated the patient during the encounter  Episode of chest pain with nausea, diaphoresis followed by syncope.  Episode of palpitations yesterday attributed to Atrial fibrillation. catherization 20121 with clean coronaries.  Glynn Octave, MD 06/19/13 [phone]

## 2013-06-19 NOTE — ED Notes (Signed)
Dr. Rancour at bedside. 

## 2013-06-20 ENCOUNTER — Other Ambulatory Visit: Payer: Self-pay | Admitting: Nurse Practitioner

## 2013-06-20 DIAGNOSIS — I471 Supraventricular tachycardia: Secondary | ICD-10-CM

## 2013-06-20 DIAGNOSIS — F17200 Nicotine dependence, unspecified, uncomplicated: Secondary | ICD-10-CM

## 2013-06-20 DIAGNOSIS — I498 Other specified cardiac arrhythmias: Secondary | ICD-10-CM

## 2013-06-20 LAB — COMPREHENSIVE METABOLIC PANEL
BUN: 11 mg/dL (ref 6–23)
CO2: 26 mEq/L (ref 19–32)
Chloride: 101 mEq/L (ref 96–112)
Creatinine, Ser: 0.83 mg/dL (ref 0.50–1.35)
GFR calc Af Amer: >90 mL/min (ref >90)
GFR calc non Af Amer: >90 mL/min (ref >90)
Glucose, Bld: 91 mg/dL (ref 70–99)
Total Bilirubin: 0.8 mg/dL (ref 0.3–1.2)

## 2013-06-20 LAB — CBC
MCH: 33 pg (ref 26.0–34.0)
Platelets: 159 10*3/uL (ref 150–400)
RBC: 4.67 MIL/uL (ref 4.22–5.81)
WBC: 11.7 10*3/uL — ABNORMAL HIGH (ref 4.0–10.5)

## 2013-06-20 LAB — LIPID PANEL
Cholesterol: 130 mg/dL (ref 0–200)
HDL: 28 mg/dL — ABNORMAL LOW (ref >39)
Total CHOL/HDL Ratio: 4.6 RATIO

## 2013-06-20 MED ORDER — DILTIAZEM HCL ER COATED BEADS 180 MG PO CP24: 180.0000 mg | ORAL_CAPSULE | Freq: Every day | ORAL | Status: DC

## 2013-06-20 MED ORDER — TEMAZEPAM 15 MG PO CAPS: 15.0000 mg | ORAL_CAPSULE | Freq: Every evening | ORAL | Status: DC | PRN

## 2013-06-20 MED ORDER — DILTIAZEM HCL ER COATED BEADS 180 MG PO CP24
180.0000 mg | ORAL_CAPSULE | Freq: Every day | ORAL | Status: DC
  Filled 2013-06-20: qty 1

## 2013-06-20 NOTE — Progress Notes (Signed)
Discharged to home with family office visits in place teaching done  

## 2013-06-20 NOTE — Progress Notes (Signed)
TELEMETRY: Reviewed telemetry pt in NSR, sinus brady: Filed Vitals:   06/19/13 2202 06/20/13 0513 06/20/13 0515 06/20/13 0516  BP: 120/77 124/69 135/84 132/75  Pulse: 52 46 63 68  Temp:  97.9 F (36.6 C)    TempSrc:  Oral    Resp:  20    Height:      Weight:      SpO2:  99%      Intake/Output Summary (Last 24 hours) at 06/20/13 0803 Last data filed at 06/20/13 0600  Gross per 24 hour  Intake    960 ml  Output    800 ml  Net    160 ml    SUBJECTIVE No complaints. Denies palpitations or dizzyness. Feels back to normal.  LABS: Basic Metabolic Panel:  Recent Labs  16/10/96 1020 06/19/13 1608 06/20/13 0642  NA 137  --  136  K 3.8  --  3.8  CL 104  --  101  CO2 25  --  26  GLUCOSE 90  --  91  BUN 11  --  11  CREATININE 0.80 0.79 0.83  CALCIUM 9.2  --  9.2   Liver Function Tests:  Recent Labs  06/19/13 1020 06/20/13 0642  AST 13 22  ALT 11 26  ALKPHOS 66 75  BILITOT 0.6 0.8  PROT 6.9 6.8  ALBUMIN 3.6 3.5   CBC:  Recent Labs  06/19/13 1020 06/19/13 1608 06/20/13 0642  WBC 9.3 10.1 11.7*  NEUTROABS 7.0  --   --   HGB 14.9 14.9 15.4  HCT 41.4 41.2 41.9  MCV 89.6 89.8 89.7  PLT 172 175 159   Cardiac Enzymes:  Recent Labs  06/19/13 1612 06/19/13 2212 06/20/13 0642  TROPONINI <0.30 <0.30 <0.30   BNP: 54.5  D-Dimer:  Recent Labs  06/19/13 1614  DDIMER <0.27   Hemoglobin A1C:  Recent Labs  06/19/13 1608  HGBA1C 5.3   Fasting Lipid Panel:  Recent Labs  06/20/13 0642  CHOL 130  HDL 28*  LDLCALC 80  TRIG 045  CHOLHDL 4.6   Thyroid Function Tests:  Recent Labs  06/19/13 1613  TSH 1.229   Radiology/Studies:  Dg Chest 2 View  06/19/2013   *RADIOLOGY REPORT*  Clinical Data: Cardiac palpitations  CHEST - 2 VIEW  Comparison:  January 06, 2013  Findings: There is no edema or consolidation.  Heart size and pulmonary vascularity are normal.  No adenopathy.  No bone lesions.  IMPRESSION: No abnormality noted.   Original Report  Authenticated By: Bretta Bang, M.D.   Ct Head Wo Contrast  06/19/2013   *RADIOLOGY REPORT*  Clinical Data: Palpitations, syncope  CT HEAD WITHOUT CONTRAST  Technique:  Contiguous axial images were obtained from the base of the skull through the vertex without contrast.  Comparison: Prior head CT 05/31/2004  Findings: No acute intracranial hemorrhage, acute infarction, mass lesion, mass effect, midline shift or hydrocephalus.  Gray-white differentiation is preserved throughout. No focal soft tissue or calvarial abnormality.  The globes and orbits are symmetric and unremarkable bilaterally.  Normal aeration of the mastoid air cells and the majority the paranasal sinuses.  Bilateral contra bullosa incidentally noted.  Mild mucoperiosteal thickening and scattered central ethmoid air cells.  IMPRESSION: Negative head CT.   Original Report Authenticated By: Malachy Moan, M.D.    PHYSICAL EXAM General: Well developed, well nourished, in no acute distress. Head: Normal Neck: Negative for carotid bruits. JVD not elevated. Lungs: Clear bilaterally to auscultation without wheezes, rales, or  rhonchi. Breathing is unlabored. Heart: RRR S1 S2 without murmurs, rubs, or gallops.  Abdomen: Soft, non-tender, non-distended with normoactive bowel sounds. No hepatomegaly.  Msk:  Strength and tone appears normal for age. Extremities: No clubbing, cyanosis or edema.  Distal pedal pulses are 2+ and equal bilaterally. Neuro: Alert and oriented X 3. Moves all extremities spontaneously. Psych:  Responds to questions appropriately with a normal affect.  ASSESSMENT AND PLAN: 1. Palpitations- suspect paroxysmal Afib versus SVT.  2. Mild aortic stenosis. 3. Syncope related to valsalva maneuver and arrhythmia.  Plan: stable for DC today from cardiac standpoint. Due to bradycardia will reduce cardizem to 180 mg daily. Continue Flecainide. Will arrange outpatient 30 day event monitor. Since symptoms are sporadic a  Holter monitor is inadequate. He has follow up with me in 2 weeks. If we can document arrhythmia he may be a candidate for ablation.  Active Problems:   Atrial fibrillation   Palpitations   Bradycardia   Chest pain   Syncope    Signed, Alfonzo Arca [place] MD,FACC 06/20/2013 8:09 AM

## 2013-06-20 NOTE — Discharge Summary (Signed)
Physician Discharge Summary  Wayne Pollard:[id number] DOB: 02-12-1966 DOA: 06/19/2013  PCP: Raquel Sarna, MD  Admit date: 06/19/2013 Discharge date: 06/20/2013  Time spent: 30 minutes  Recommendations for Outpatient Follow-up:  1. Outpatient 30 day event monitor arranged by Dr. Swaziland  Discharge Diagnoses:  Active Problems:   Atrial fibrillation   Palpitations   Bradycardia   Chest pain   Syncope   Discharge Condition: Stable  Diet recommendation: Regular  Filed Weights   06/19/13 1450  Weight: 84.1 kg (185 lb 6.5 oz)    History of present illness:  Wayne Pollard is a 47 y.o. male with a one-year history of paroxysmal atrial fibrillation reported that he woke up this morning with palpitations and performed a Valsalva maneuver which improved the symptoms. He went back to sleep and subsequently went to work. He did not feel good this morning. He's felt a tightness in his chest and malaise. Patient has been taking all medications for atrial fibrillation including flecainide as directed and has not missed any doses. Patient went to work and at 6:30 AM had a severe left-sided pressure-like chest pain radiating to the left jaw associated with shortness of breath nausea and diaphoresis. Patient was at work at the time, he passed out completely and was found may have been out for up to 5 minutes and was awakened to find EMS services present.. Patient had full dose aspirin and EMS transported him to ER. Pt says he feels extremely fatigued. He was given nitroglycerin in the emergency department he reports that that improved and resolved the chest pain. He's now complaining of severe headache. He had a negative CT of head study done. He is a former smoker but quit last year after smoking for 12 years. His father has a history of heart disease. Hospital admission was requested for further evaluation of this syncopal episode and subsequent chest pain.   Hospital Course:  The patient was admitted  to the floor. Cardiology was consulted. Cardiac biomarkers were found to be neg x 3. Bilateral carotid dopplers were found to be neg and a 2D echo was found to be unremarkable with an EF of 55-60%. The patient was noted to be bradycardic w/ HR into the 40-50's. The patient's Cardizem was decreased to 180mg  and his flecainide was continued. The patient remained stable with Cardiology recs for close follow up in 2 weeks and a 30 day event monitor, arranged per Cardiology.  Consultations:  Cardiology  Discharge Exam: Filed Vitals:   06/19/13 2202 06/20/13 0513 06/20/13 0515 06/20/13 0516  BP: 120/77 124/69 135/84 132/75  Pulse: 52 46 63 68  Temp:  97.9 F (36.6 C)    TempSrc:  Oral    Resp:  20    Height:      Weight:      SpO2:  99%      General: Awake, in nad Cardiovascular: regular, s1, s2 Respiratory: normal resp effort, no wheezing  Discharge Instructions       Future Appointments Provider Department Dept Phone   07/23/2013 9:15 AM Peter M Swaziland, MD [facility] Main Office [place]) [phone]       Medication List         ALPRAZolam 0.5 MG tablet  Commonly known as:  XANAX  Take 0.5 mg by mouth 2 (two) times daily as needed.     aspirin 325 MG tablet  Take 325 mg by mouth daily.     diltiazem 180 MG 24 hr capsule  Commonly known as:  CARDIZEM CD  Take 1 capsule (180 mg total) by mouth daily.     flecainide 50 MG tablet  Commonly known as:  TAMBOCOR  Take 50 mg by mouth 2 (two) times daily.     omeprazole 20 MG tablet  Commonly known as:  PRILOSEC OTC  Take 20 mg by mouth 2 (two) times daily.     temazepam 15 MG capsule  Commonly known as:  RESTORIL  Take 1 capsule (15 mg total) by mouth at bedtime as needed for sleep.     temazepam 15 MG capsule  Commonly known as:  RESTORIL  Take 15 mg by mouth at bedtime as needed for sleep.       Allergies  Allergen Reactions  . Codeine    Follow-up Information   Follow up with Raquel Sarna, MD.  Schedule an appointment as soon as possible for a visit in 1 week.   Specialty:  Family Medicine   Contact information:   [address] [place] [address]       Follow up with Peter Swaziland, MD. Schedule an appointment as soon as possible for a visit in 2 weeks.   Specialty:  Cardiology   Contact information:   [address] [place] [address] [phone]        The results of significant diagnostics from this hospitalization (including imaging, microbiology, ancillary and laboratory) are listed below for reference.    Significant Diagnostic Studies: Dg Chest 2 View  06/19/2013   *RADIOLOGY REPORT*  Clinical Data: Cardiac palpitations  CHEST - 2 VIEW  Comparison:  January 06, 2013  Findings: There is no edema or consolidation.  Heart size and pulmonary vascularity are normal.  No adenopathy.  No bone lesions.  IMPRESSION: No abnormality noted.   Original Report Authenticated By: Bretta Bang, M.D.   Ct Head Wo Contrast  06/19/2013   *RADIOLOGY REPORT*  Clinical Data: Palpitations, syncope  CT HEAD WITHOUT CONTRAST  Technique:  Contiguous axial images were obtained from the base of the skull through the vertex without contrast.  Comparison: Prior head CT 05/31/2004  Findings: No acute intracranial hemorrhage, acute infarction, mass lesion, mass effect, midline shift or hydrocephalus.  Gray-white differentiation is preserved throughout. No focal soft tissue or calvarial abnormality.  The globes and orbits are symmetric and unremarkable bilaterally.  Normal aeration of the mastoid air cells and the majority the paranasal sinuses.  Bilateral contra bullosa incidentally noted.  Mild mucoperiosteal thickening and scattered central ethmoid air cells.  IMPRESSION: Negative head CT.   Original Report Authenticated By: Malachy Moan, M.D.    Microbiology: No results found for this or any previous visit (from the past 240 hour(s)).   Labs: Basic Metabolic  Panel:  Recent Labs Lab 06/19/13 1020 06/19/13 1608 06/20/13 0642  NA 137  --  136  K 3.8  --  3.8  CL 104  --  101  CO2 25  --  26  GLUCOSE 90  --  91  BUN 11  --  11  CREATININE 0.80 0.79 0.83  CALCIUM 9.2  --  9.2   Liver Function Tests:  Recent Labs Lab 06/19/13 1020 06/20/13 0642  AST 13 22  ALT 11 26  ALKPHOS 66 75  BILITOT 0.6 0.8  PROT 6.9 6.8  ALBUMIN 3.6 3.5   No results found for this basename: LIPASE, AMYLASE,  in the last 168 hours No results found for this basename: AMMONIA,  in the last 168 hours CBC:  Recent Labs Lab 06/19/13 1020 06/19/13 1608 06/20/13 0642  WBC 9.3 10.1 11.7*  NEUTROABS 7.0  --   --   HGB 14.9 14.9 15.4  HCT 41.4 41.2 41.9  MCV 89.6 89.8 89.7  PLT 172 175 159   Cardiac Enzymes:  Recent Labs Lab 06/19/13 1612 06/19/13 2212 06/20/13 0642  TROPONINI <0.30 <0.30 <0.30   BNP: BNP (last 3 results)  Recent Labs  06/19/13 1020  PROBNP 54.5   CBG: No results found for this basename: GLUCAP,  in the last 168 hours     Signed:  CHIU, STEPHEN K  [facility] 06/20/2013, 10:16 AM

## 2013-06-23 ENCOUNTER — Encounter: Payer: Self-pay | Admitting: *Deleted

## 2013-06-23 ENCOUNTER — Encounter (INDEPENDENT_AMBULATORY_CARE_PROVIDER_SITE_OTHER): Payer: BC Managed Care – PPO

## 2013-06-23 DIAGNOSIS — I498 Other specified cardiac arrhythmias: Secondary | ICD-10-CM

## 2013-06-23 DIAGNOSIS — R55 Syncope and collapse: Secondary | ICD-10-CM

## 2013-06-23 DIAGNOSIS — R002 Palpitations: Secondary | ICD-10-CM

## 2013-06-23 DIAGNOSIS — I471 Supraventricular tachycardia: Secondary | ICD-10-CM

## 2013-06-23 NOTE — Progress Notes (Signed)
Patient ID: Wayne Pollard, male   DOB: 06/30/1966, 47 y.o.   MRN: [id number] [facility] 21 day cardiac event monitor applied to patient.

## 2013-06-24 ENCOUNTER — Telehealth: Payer: Self-pay | Admitting: Cardiology

## 2013-06-24 NOTE — Telephone Encounter (Signed)
Heart rate has been in the 50-40 range since I left the hospital.   Have not been able to take my Diltiazen 180 mg or Flecainide 50 mg.

## 2013-06-24 NOTE — Telephone Encounter (Signed)
Returned call to patient he stated he was told on discharge from hospital 06/20/13 to hold flecainide and hold diltiazem if heart rate under 50.Stated heart rate has been 40 to 50 beats/min and has not taken flecainide or diltiazem.Stated he feels bad,weak,headache.Stated he wanted to make sure this was correct.Stated he started wearing 21 day monitor 06/23/13.Dr.Jordan out of office, spoke to DOD Dr.Taylor he advised to start taking flecainide 50 mg twice a day and continue to hold diltiazem.Advised to come to office for a EKG.Appointment scheduled for a EKG 06/26/13.

## 2013-06-24 NOTE — Telephone Encounter (Signed)
Follow up   2nd call  Pt hasnt had an meds since he has left the hospital was advised to not take meds unless he was out of 40-50 heart rate/// pt wanted dr to be aware that he has not taken any medications and its making him feel worse.

## 2013-06-26 ENCOUNTER — Ambulatory Visit (INDEPENDENT_AMBULATORY_CARE_PROVIDER_SITE_OTHER): Payer: BC Managed Care – PPO | Admitting: *Deleted

## 2013-06-26 VITALS — BP 122/72 | HR 60 | Resp 12 | Ht 72.0 in | Wt 189.0 lb

## 2013-06-26 DIAGNOSIS — I480 Paroxysmal atrial fibrillation: Secondary | ICD-10-CM

## 2013-06-26 DIAGNOSIS — I498 Other specified cardiac arrhythmias: Secondary | ICD-10-CM

## 2013-06-26 DIAGNOSIS — R001 Bradycardia, unspecified: Secondary | ICD-10-CM

## 2013-06-26 DIAGNOSIS — I4891 Unspecified atrial fibrillation: Secondary | ICD-10-CM

## 2013-06-26 MED ORDER — ASPIRIN 81 MG PO TABS: 81.0000 mg | ORAL_TABLET | Freq: Every day | ORAL | Status: DC

## 2013-06-26 MED ORDER — PINDOLOL 5 MG PO TABS: 2.5000 mg | ORAL_TABLET | Freq: Two times a day (BID) | ORAL | Status: DC

## 2013-06-26 NOTE — Progress Notes (Signed)
Pt ambulated without difficulty. Pt is here for an EKG/ see yesterday's phone note. Pt states he feels much better than yesterday since he has stopped carvedilol. ekg and vitals performed.  Reviewed with DOD Dr Graciela Husbands and orders followed.  Reduce asa to 81 mg, stop carvedilol, start pindolol 2.5 mg BID and pt to see Dr Swaziland with in 2 weeks.  Medications were reviewed with pt, appointment made and pt verbalized understanding.

## 2013-06-26 NOTE — Patient Instructions (Addendum)
Your physician has recommended you make the following change in your medication:   Reduce aspirin to 81 mg daily Stop cardizem Start Pindalol  2.5 mg twice daily 12 hours apart.  The tablet is 5 mg/ you will cut in two and take 1/2 twice daily  Your physician recommends that you schedule a follow-up appointment in: in 1-2 weeks with Dr Swaziland

## 2013-07-09 ENCOUNTER — Ambulatory Visit (INDEPENDENT_AMBULATORY_CARE_PROVIDER_SITE_OTHER): Payer: BC Managed Care – PPO | Admitting: Cardiology

## 2013-07-09 ENCOUNTER — Encounter: Payer: Self-pay | Admitting: Cardiology

## 2013-07-09 VITALS — BP 113/76 | HR 64 | Ht 72.0 in | Wt 194.0 lb

## 2013-07-09 DIAGNOSIS — F17200 Nicotine dependence, unspecified, uncomplicated: Secondary | ICD-10-CM

## 2013-07-09 DIAGNOSIS — I4891 Unspecified atrial fibrillation: Secondary | ICD-10-CM

## 2013-07-09 DIAGNOSIS — R001 Bradycardia, unspecified: Secondary | ICD-10-CM

## 2013-07-09 DIAGNOSIS — I498 Other specified cardiac arrhythmias: Secondary | ICD-10-CM

## 2013-07-09 NOTE — Progress Notes (Signed)
Jewel Baize Spivey Date of Birth: 05-13-1966 Medical Record #[id number]  History of Present Illness: Mr. Cavanagh is seen for followup of atrial fibrillation. He was recently admitted in early September with recurrent tachycardia palpitations and subsequent syncope with following a Valsalva maneuver. His evaluation in the hospital was unremarkable. He was significantly bradycardic with heart rates in the 40-50 range. His diltiazem dose was reduced. On discharge he had an event monitor placed. He complained of persistent severe fatigue and stopped taking his diltiazem. Based on a note from September 11 he was started on pindolol 2.5 mg twice a day in addition to his flecainide. He continues to complain of significant fatigue. Some days he feels better than others but he states he has more days where he feels bad that he feels good. After he has a bout of arrhythmia he feels wiped out the whole next day. He wants to know if there is something else he can have done about this that would require to take all these medications. The results of his event monitor are still pending.  Current Outpatient Prescriptions on File Prior to Visit  Medication Sig Dispense Refill  . ALPRAZolam (XANAX) 0.5 MG tablet Take 0.5 mg by mouth 2 (two) times daily as needed.       Marland Kitchen aspirin 81 MG tablet Take 1 tablet (81 mg total) by mouth daily.      . flecainide (TAMBOCOR) 50 MG tablet Take 50 mg by mouth 2 (two) times daily.      Marland Kitchen omeprazole (PRILOSEC OTC) 20 MG tablet Take 20 mg by mouth 2 (two) times daily.      . pindolol (VISKEN) 5 MG tablet Take 0.5 tablets (2.5 mg total) by mouth 2 (two) times daily.  30 tablet  6  . temazepam (RESTORIL) 15 MG capsule Take 1 capsule (15 mg total) by mouth at bedtime as needed for sleep.  14 capsule  0   No current facility-administered medications on file prior to visit.    Allergies  Allergen Reactions  . Codeine     Past Medical History  Diagnosis Date  . Anxiety   . Paroxysmal  atrial fibrillation     Past Surgical History  Procedure Laterality Date  . Appendectomy    . Cholecystectomy    . Hernia repair    . Cardiac catheterization      History  Smoking status  . Former Smoker  . Types: Cigarettes  . Quit date: 12/24/2012  Smokeless tobacco  . Not on file    History  Alcohol Use No    Family History  Problem Relation Age of Onset  . Hypertension Mother   . Heart attack Father   . Heart failure Father     Review of Systems: The review of systems is positive for fatigue. He does complain of anxiety. All other systems were reviewed and are negative.  Physical Exam: BP 113/76  Pulse 64  Ht 6' (1.829 m)  Wt 87.998 kg (194 lb)  BMI 26.31 kg/m2 He is a pleasant white male in no acute distress. HEENT: Normocephalic, atraumatic. Pupils equal round and reactive to light accommodation. Extraocular movements are full. Oropharynx is clear with good dentition. Neck is supple no JVD, adenopathy, thyromegaly, or bruits. Lungs: Clear Cardiovascular: Regular rate and rhythm. Normal S1 and S2. No gallop or click. He has a soft grade 2/6 systolic ejection murmur heard best at the left sternal border and apex. PMI is normal. Abdomen: Soft and nontender. No  masses organosplenomegaly. No bruits. Bowel sounds positive. Extremities: No cyanosis or edema. Pulses are 2+ and symmetric. Skin: Warm and dry Neuro: Alert and oriented x3. Cranial nerves II through XII are intact.  LABORATORY DATA:   Assessment / Plan: 1. Paroxysmal atrial fibrillation.  I recommended avoidance of stimulants including tobacco, caffeine, and decongestants. I recommended reducing his pindolol to 2.5 mg daily. Given his intolerance of medications I recommended evaluation by Dr. Johney Frame to see if he would be a candidate for ablative therapy. I'll followup again in 3 months. We will await the results of his event monitor.

## 2013-07-09 NOTE — Patient Instructions (Addendum)
Reduce pindolol to one half tablet daily.  We will schedule you to see Dr. Johney Frame for electrophysiology evaluation

## 2013-07-16 ENCOUNTER — Telehealth: Payer: Self-pay

## 2013-07-16 NOTE — Telephone Encounter (Signed)
Patient called Dr.Jordan reviewed event monitor which revealed normal sinus rhythm with pvc's.Advised to keep appointment with Dr.Allred 07/30/13.

## 2013-07-23 ENCOUNTER — Ambulatory Visit: Payer: BC Managed Care – PPO | Admitting: Cardiology

## 2013-07-30 ENCOUNTER — Encounter: Payer: Self-pay | Admitting: *Deleted

## 2013-07-30 ENCOUNTER — Encounter: Payer: Self-pay | Admitting: Internal Medicine

## 2013-07-30 ENCOUNTER — Ambulatory Visit (INDEPENDENT_AMBULATORY_CARE_PROVIDER_SITE_OTHER): Payer: BC Managed Care – PPO | Admitting: Internal Medicine

## 2013-07-30 ENCOUNTER — Encounter (INDEPENDENT_AMBULATORY_CARE_PROVIDER_SITE_OTHER): Payer: Self-pay

## 2013-07-30 VITALS — BP 124/80 | HR 60 | Ht 72.0 in | Wt 192.8 lb

## 2013-07-30 DIAGNOSIS — I4891 Unspecified atrial fibrillation: Secondary | ICD-10-CM

## 2013-07-30 MED ORDER — RIVAROXABAN 20 MG PO TABS: 20.0000 mg | ORAL_TABLET | Freq: Every day | ORAL | Status: DC

## 2013-07-30 NOTE — Patient Instructions (Addendum)
Your physician has recommended that you have an ablation. Catheter ablation is a medical procedure used to treat some cardiac arrhythmias (irregular heartbeats). During catheter ablation, a long, thin, flexible tube is put into a blood vessel in your groin (upper thigh), or neck. This tube is called an ablation catheter. It is then guided to your heart through the blood vessel. Radio frequency waves destroy small areas of heart tissue where abnormal heartbeats may cause an arrhythmia to start. Please see the instruction sheet given to you today.   Come to the office on 08/12/13 for labs  See instruction sheet for ablation   Your physician has recommended you make the following change in your medication:  1) Start Xarelto 20 mg daily

## 2013-07-30 NOTE — Progress Notes (Signed)
Primary Care Physician: Raquel Sarna, MD Referring Physician:  Dr Swaziland   Wayne Pollard is a 47 y.o. male with a h/o recently diagnosed atrial fibrillation who presents today for EP consultation.  He reports initially being diagnosed with atrial fibrillation several years ago after presenting to [facility] with afib with RVR.  He reports that he has previously been able to terminate episodes with vagal maneuvers.  Episodes have increased in frequency and duration recently.  Recently, he has had bradycardia with his afib.  He had syncope with an episode of afib recently for which he presented to [facility].  He was evaluated by Dr Patty Sermons and felt to have syncope related to a vagal event.  He has been treated with flecainide, metoprolol, and diltiazem but has not found these to be well tolerated.  He feels that his afib continues to increase in frequency and duration despite medical therapy.  He reports symptoms of fatigue and decreased exercise tolerance with associated dizziness during afib. Today, he denies symptoms of  chest pain, shortness of breath, orthopnea, PND, lower extremity edema, or neurologic sequela. The patient is tolerating medications without difficulties and is otherwise without complaint today.  The echocardiogram noted that he was in atrial fibrillation and that he had normal left ventricular function with ejection fraction of 60-65% with mild LVH. There was mild left atrial enlargement. He states that when he ago he had a stress test in [place] which was normal.  Past Medical History  Diagnosis Date  . Anxiety   . Paroxysmal atrial fibrillation    Past Surgical History  Procedure Laterality Date  . Appendectomy    . Cholecystectomy    . Hernia repair    . Cardiac catheterization      [facility], Pt reports no CAD    Current Outpatient Prescriptions  Medication Sig Dispense Refill  . ALPRAZolam (XANAX) 0.5 MG tablet Take 0.5 mg by mouth 2  (two) times daily as needed.       Marland Kitchen aspirin 81 MG tablet Take 1 tablet (81 mg total) by mouth daily.      . flecainide (TAMBOCOR) 50 MG tablet Take 50 mg by mouth 2 (two) times daily.      Marland Kitchen omeprazole (PRILOSEC OTC) 20 MG tablet Take 20 mg by mouth 2 (two) times daily.      . pindolol (VISKEN) 5 MG tablet Take 0.5 tablets (2.5 mg total) by mouth 2 (two) times daily.  30 tablet  6  . temazepam (RESTORIL) 15 MG capsule Take 1 capsule (15 mg total) by mouth at bedtime as needed for sleep.  14 capsule  0   No current facility-administered medications for this visit.    Allergies  Allergen Reactions  . Codeine     History   Social History  . Marital Status: Married    Spouse Name: N/A    Number of Children: 2  . Years of Education: N/A   Occupational History  . communications    Social History Main Topics  . Smoking status: Former Smoker    Types: Cigarettes    Quit date: 12/24/2012  . Smokeless tobacco: Not on file  . Alcohol Use: No  . Drug Use: No  . Sexual Activity: Not on file   Other Topics Concern  . Not on file   Social History Narrative   Pt lives in [place] with spouse.  Owns a Nurse, adult    Family History  Problem Relation  Age of Onset  . Hypertension Mother   . Heart attack Father   . Heart failure Father     ROS- All systems are reviewed and negative except as per the HPI above  Physical Exam: Filed Vitals:   07/30/13 1041  BP: 124/80  Pulse: 60  Height: 6' (1.829 m)  Weight: 192 lb 12.8 oz (87.454 kg)    GEN- The patient is well appearing, alert and oriented x 3 today.   Head- normocephalic, atraumatic Eyes-  Sclera clear, conjunctiva pink Ears- hearing intact Oropharynx- clear Neck- supple, no JVP Lymph- no cervical lymphadenopathy Lungs- Clear to ausculation bilaterally, normal work of breathing Heart- Regular rate and rhythm, no murmurs, rubs or gallops, PMI not laterally displaced GI- soft, NT, ND, + BS Extremities- no  clubbing, cyanosis, or edema MS- no significant deformity or atrophy Skin- no rash or lesion Psych- euthymic mood, full affect Neuro- strength and sensation are intact  EKG today reveals sinus rhythm 62 bpm, PR 146, Qtc 424, otherwise normal ekg ekg 01/06/13 reveals afib with RVR Echo 01/06/13- EF 60-65%, LA size 41mm, trace MR  Assessment and Plan:  1. Paroxysmal atrial fibrillation The patient has symptomatic recurrent paroxysmal atrial fibrillation.  He has failed medical therapy with flecainide, metoprolol and diltiazem.  Therapeutic strategies for afib including medicine and ablation were discussed in detail with the patient today. Risk, benefits, and alternatives to EP study and radiofrequency ablation for afib were also discussed in detail today. These risks include but are not limited to stroke, bleeding, vascular damage, tamponade, perforation, damage to the esophagus, lungs, and other structures, pulmonary vein stenosis, worsening renal function, and death. The patient understands these risk and wishes to proceed.  We will therefore proceed with catheter ablation once the patient has been adequately anticoagulated.  Xarelto 20mg  daily is initiated today.

## 2013-08-03 ENCOUNTER — Encounter: Payer: Self-pay | Admitting: Internal Medicine

## 2013-08-04 ENCOUNTER — Telehealth: Payer: Self-pay | Admitting: Physician Assistant

## 2013-08-04 NOTE — Telephone Encounter (Signed)
Pt called because he has been going in/out of atrial fibrillation today.   He doesn't usually have multiple episodes in a day, but has had 3 episodes today.   His heart rate has not been extremely high, he has not felt as though he would pass out or fall. He is compliant with his medications, including Xarelto.  Advised him as long as he is on the Xarelto, he is in no significant danger of having a stroke. He has a low heart rate at baseline and so he cannot just take more of his meds to try and control this. Currently he feels he is in sinus, HR is 58.  Encouraged him to continue current therapies, call us back if he continues to have more frequent episodes. He was reassured that he is unlikely to have a stroke and will do as asked.

## 2013-08-05 ENCOUNTER — Other Ambulatory Visit: Payer: Self-pay | Admitting: *Deleted

## 2013-08-05 DIAGNOSIS — I4891 Unspecified atrial fibrillation: Secondary | ICD-10-CM

## 2013-08-12 ENCOUNTER — Other Ambulatory Visit (INDEPENDENT_AMBULATORY_CARE_PROVIDER_SITE_OTHER): Payer: BC Managed Care – PPO

## 2013-08-12 DIAGNOSIS — I4891 Unspecified atrial fibrillation: Secondary | ICD-10-CM

## 2013-08-13 ENCOUNTER — Encounter (HOSPITAL_COMMUNITY): Payer: Self-pay | Admitting: Pharmacy Technician

## 2013-08-13 LAB — BASIC METABOLIC PANEL
Calcium: 9.1 mg/dL (ref 8.4–10.5)
GFR: 85.01 mL/min (ref >60.00)
Glucose, Bld: 87 mg/dL (ref 70–99)
Potassium: 3.8 mEq/L (ref 3.5–5.1)
Sodium: 139 mEq/L (ref 135–145)

## 2013-08-13 LAB — CBC WITH DIFFERENTIAL/PLATELET
Basophils Absolute: 0 10*3/uL (ref 0.0–0.1)
Eosinophils Absolute: 0.3 10*3/uL (ref 0.0–0.7)
Eosinophils Relative: 3.4 % (ref 0.0–5.0)
HCT: 43 % (ref 39.0–52.0)
Hemoglobin: 14.8 g/dL (ref 13.0–17.0)
Lymphocytes Relative: 31.5 % (ref 12.0–46.0)
MCV: 93.3 fl (ref 78.0–100.0)
Monocytes Relative: 5.5 % (ref 3.0–12.0)
Neutro Abs: 4.9 10*3/uL (ref 1.4–7.7)
Platelets: 181 10*3/uL (ref 150.0–400.0)
RDW: 12.4 % (ref 11.5–14.6)
WBC: 8.2 10*3/uL (ref 4.5–10.5)

## 2013-08-18 ENCOUNTER — Ambulatory Visit (HOSPITAL_COMMUNITY)
Admission: RE | Admit: 2013-08-18 | Discharge: 2013-08-18 | Disposition: A | Discharge status: Home / Self Care | Payer: BC Managed Care – PPO | Source: Ambulatory Visit | Attending: Cardiology | Admitting: Cardiology

## 2013-08-18 ENCOUNTER — Encounter (HOSPITAL_COMMUNITY): Payer: Self-pay | Admitting: Cardiology

## 2013-08-18 ENCOUNTER — Other Ambulatory Visit: Payer: Self-pay | Admitting: Cardiology

## 2013-08-18 ENCOUNTER — Encounter (HOSPITAL_COMMUNITY)
Admission: RE | Disposition: A | Discharge status: Home / Self Care | Payer: Self-pay | Source: Ambulatory Visit | Attending: Cardiology

## 2013-08-18 DIAGNOSIS — I4891 Unspecified atrial fibrillation: Secondary | ICD-10-CM

## 2013-08-18 DIAGNOSIS — I359 Nonrheumatic aortic valve disorder, unspecified: Secondary | ICD-10-CM | POA: Insufficient documentation

## 2013-08-18 HISTORY — PX: TEE WITHOUT CARDIOVERSION: SHX5443

## 2013-08-18 SURGERY — ECHOCARDIOGRAM, TRANSESOPHAGEAL
Anesthesia: Moderate Sedation

## 2013-08-18 MED ORDER — FENTANYL CITRATE 0.05 MG/ML IJ SOLN
INTRAMUSCULAR | Status: AC
  Filled 2013-08-18: qty 2

## 2013-08-18 MED ORDER — BUTAMBEN-TETRACAINE-BENZOCAINE 2-2-14 % EX AERO
INHALATION_SPRAY | CUTANEOUS | Status: DC | PRN
  Administered 2013-08-18: 2 via TOPICAL

## 2013-08-18 MED ORDER — MIDAZOLAM HCL 10 MG/2ML IJ SOLN
INTRAMUSCULAR | Status: DC | PRN
  Administered 2013-08-18 (×3): 2 mg via INTRAVENOUS

## 2013-08-18 MED ORDER — DIPHENHYDRAMINE HCL 50 MG/ML IJ SOLN
INTRAMUSCULAR | Status: AC
  Filled 2013-08-18: qty 1

## 2013-08-18 MED ORDER — MIDAZOLAM HCL 5 MG/ML IJ SOLN
INTRAMUSCULAR | Status: AC
  Filled 2013-08-18: qty 2

## 2013-08-18 MED ORDER — SODIUM CHLORIDE 0.9 % IV SOLN
INTRAVENOUS | Status: DC
  Administered 2013-08-18: 09:00:00 via INTRAVENOUS

## 2013-08-18 MED ORDER — FENTANYL CITRATE 0.05 MG/ML IJ SOLN
INTRAMUSCULAR | Status: DC | PRN
  Administered 2013-08-18: 25 ug via INTRAVENOUS
  Administered 2013-08-18: 50 ug via INTRAVENOUS

## 2013-08-18 NOTE — Progress Notes (Signed)
  Echocardiogram Echocardiogram Transesophageal has been performed.  Georgian Co 08/18/2013, 10:36 AM

## 2013-08-18 NOTE — Interval H&P Note (Signed)
History and Physical Interval Note:  08/18/2013 10:03 AM  Wayne Pollard  has presented today for surgery, with the diagnosis of a fib  The various methods of treatment have been discussed with the patient and family. After consideration of risks, benefits and other options for treatment, the patient has consented to  Procedure(s): TRANSESOPHAGEAL ECHOCARDIOGRAM (TEE) (N/A) as a surgical intervention .  The patient's history has been reviewed, patient examined, no change in status, stable for surgery.  I have reviewed the patient's chart and labs.  Questions were answered to the patient's satisfaction.     [name]

## 2013-08-18 NOTE — H&P (Signed)
Wayne Pollard  07/30/2013 10:45 AM   Office Visit  MRN:  [id number]   Description: 47 year old male  Provider: Hillis Range, MD  Department: [address] [facility]        Referring Provider    Raquel Sarna, MD      Diagnoses    Atrial fibrillation    -  Primary    427.31          Progress Notes    Hillis Range, MD at 07/30/2013 11:24 AM    Status: Signed             Primary Care Physician: Raquel Sarna, MD Referring Physician:  Dr [place]     Wayne Pollard is a 47 y.o. male with a h/o recently diagnosed atrial fibrillation who presents today for EP consultation.  He reports initially being diagnosed with atrial fibrillation several years ago after presenting to [facility] with afib with RVR.  He reports that he has previously been able to terminate episodes with vagal maneuvers.  Episodes have increased in frequency and duration recently.  Recently, he has had bradycardia with his afib.  He had syncope with an episode of afib recently for which he presented to [facility].  He was evaluated by Dr Patty Sermons and felt to have syncope related to a vagal event.  He has been treated with flecainide, metoprolol, and diltiazem but has not found these to be well tolerated.  He feels that his afib continues to increase in frequency and duration despite medical therapy.  He reports symptoms of fatigue and decreased exercise tolerance with associated dizziness during afib. Today, he denies symptoms of  chest pain, shortness of breath, orthopnea, PND, lower extremity edema, or neurologic sequela. The patient is tolerating medications without difficulties and is otherwise without complaint today.   The echocardiogram noted that he was in atrial fibrillation and that he had normal left ventricular function with ejection fraction of 60-65% with mild LVH. There was mild left atrial enlargement. He states that when he ago he had a stress test in [place] which was normal.    Past  Medical History   Diagnosis  Date   .  Anxiety     .  Paroxysmal atrial fibrillation         Past Surgical History   Procedure  Laterality  Date   .  Appendectomy       .  Cholecystectomy       .  Hernia repair       .  Cardiac catheterization           [facility], Pt reports no CAD         Current Outpatient Prescriptions   Medication  Sig  Dispense  Refill   .  ALPRAZolam (XANAX) 0.5 MG tablet  Take 0.5 mg by mouth 2 (two) times daily as needed.          Marland Kitchen  aspirin 81 MG tablet  Take 1 tablet (81 mg total) by mouth daily.         .  flecainide (TAMBOCOR) 50 MG tablet  Take 50 mg by mouth 2 (two) times daily.         Marland Kitchen  omeprazole (PRILOSEC OTC) 20 MG tablet  Take 20 mg by mouth 2 (two) times daily.         .  pindolol (VISKEN) 5 MG tablet  Take 0.5 tablets (2.5 mg total) by  mouth 2 (two) times daily.   30 tablet   6   .  temazepam (RESTORIL) 15 MG capsule  Take 1 capsule (15 mg total) by mouth at bedtime as needed for sleep.   14 capsule   0       No current facility-administered medications for this visit.         Allergies   Allergen  Reactions   .  Codeine           History       Social History   .  Marital Status:  Married       Spouse Name:  N/A       Number of Children:  2   .  Years of Education:  N/A       Occupational History   .  communications         Social History Main Topics   .  Smoking status:  Former Smoker       Types:  Cigarettes       Quit date:  12/24/2012   .  Smokeless tobacco:  Not on file   .  Alcohol Use:  No   .  Drug Use:  No   .  Sexual Activity:  Not on file       Other Topics  Concern   .  Not on file       Social History Narrative     Pt lives in [place] with spouse.  Owns a Nurse, adult         Family History   Problem  Relation  Age of Onset   .  Hypertension  Mother     .  Heart attack  Father     .  Heart failure  Father          ROS- All systems are reviewed and negative  except as per the HPI above   Physical Exam: Filed Vitals:     07/30/13 1041   BP:  124/80   Pulse:  60   Height:  6' (1.829 m)   Weight:  192 lb 12.8 oz (87.454 kg)        GEN- The patient is well appearing, alert and oriented x 3 today.    Head- normocephalic, atraumatic Eyes-  Sclera clear, conjunctiva pink Ears- hearing intact Oropharynx- clear Neck- supple, no JVP Lymph- no cervical lymphadenopathy Lungs- Clear to ausculation bilaterally, normal work of breathing Heart- Regular rate and rhythm, no murmurs, rubs or gallops, PMI not laterally displaced GI- soft, NT, ND, + BS Extremities- no clubbing, cyanosis, or edema MS- no significant deformity or atrophy Skin- no rash or lesion Psych- euthymic mood, full affect Neuro- strength and sensation are intact   EKG today reveals sinus rhythm 62 bpm, PR 146, Qtc 424, otherwise normal ekg ekg 01/06/13 reveals afib with RVR Echo 01/06/13- EF 60-65%, LA size 41mm, trace MR   Assessment and Plan:   1. Paroxysmal atrial fibrillation The patient has symptomatic recurrent paroxysmal atrial fibrillation.  He has failed medical therapy with flecainide, metoprolol and diltiazem.  Therapeutic strategies for afib including medicine and ablation were discussed in detail with the patient today. Risk, benefits, and alternatives to EP study and radiofrequency ablation for afib were also discussed in detail today. These risks include but are not limited to stroke, bleeding, vascular damage, tamponade, perforation, damage to the esophagus, lungs, and other structures, pulmonary vein stenosis, worsening renal function, and death. The patient understands  these risk and wishes to proceed.  We will therefore proceed with catheter ablation once the patient has been adequately anticoagulated.  Xarelto 20mg  daily is initiated today.    For TEE prior to atrial fibrillation ablation; no changes [name]

## 2013-08-18 NOTE — CV Procedure (Addendum)
See full TEE report in [employer]. Normal LV function, mild LAE, no LAA thrombus, mildly calcified aortic valve with partial fusion of left and right cusps. [name]

## 2013-08-19 ENCOUNTER — Encounter (HOSPITAL_COMMUNITY)
Admission: RE | Disposition: A | Discharge status: Home / Self Care | Payer: Self-pay | Source: Ambulatory Visit | Attending: Internal Medicine

## 2013-08-19 ENCOUNTER — Ambulatory Visit (HOSPITAL_COMMUNITY): Payer: BC Managed Care – PPO | Admitting: Certified Registered"

## 2013-08-19 ENCOUNTER — Ambulatory Visit (HOSPITAL_COMMUNITY)
Admission: RE | Admit: 2013-08-19 | Discharge: 2013-08-20 | Disposition: A | Discharge status: Home / Self Care | Payer: BC Managed Care – PPO | Source: Ambulatory Visit | Attending: Internal Medicine | Admitting: Internal Medicine

## 2013-08-19 ENCOUNTER — Encounter (HOSPITAL_COMMUNITY): Payer: Self-pay | Admitting: Anesthesiology

## 2013-08-19 ENCOUNTER — Encounter (HOSPITAL_COMMUNITY): Payer: BC Managed Care – PPO | Admitting: Certified Registered"

## 2013-08-19 DIAGNOSIS — R001 Bradycardia, unspecified: Secondary | ICD-10-CM | POA: Diagnosis present

## 2013-08-19 DIAGNOSIS — Z7901 Long term (current) use of anticoagulants: Secondary | ICD-10-CM | POA: Insufficient documentation

## 2013-08-19 DIAGNOSIS — Z79899 Other long term (current) drug therapy: Secondary | ICD-10-CM | POA: Insufficient documentation

## 2013-08-19 DIAGNOSIS — I4891 Unspecified atrial fibrillation: Secondary | ICD-10-CM | POA: Insufficient documentation

## 2013-08-19 HISTORY — PX: ATRIAL FIBRILLATION ABLATION: SHX5456

## 2013-08-19 LAB — POCT ACTIVATED CLOTTING TIME
Activated Clotting Time: 314 s
Activated Clotting Time: 268 s

## 2013-08-19 SURGERY — ATRIAL FIBRILLATION ABLATION
Anesthesia: General

## 2013-08-19 MED ORDER — HEPARIN SODIUM (PORCINE) 1000 UNIT/ML IJ SOLN
INTRAMUSCULAR | Status: DC | PRN
  Administered 2013-08-19: 12000 [IU] via INTRAVENOUS
  Administered 2013-08-19: 4000 [IU] via INTRAVENOUS

## 2013-08-19 MED ORDER — ONDANSETRON HCL 4 MG/2ML IJ SOLN: 4.0000 mg | Freq: Four times a day (QID) | INTRAMUSCULAR | Status: DC | PRN

## 2013-08-19 MED ORDER — SODIUM CHLORIDE 0.9 % IV SOLN
INTRAVENOUS | Status: DC
  Administered 2013-08-19: 1000 mL via INTRAVENOUS

## 2013-08-19 MED ORDER — ISOPROTERENOL HCL 0.2 MG/ML IJ SOLN
INTRAMUSCULAR | Status: AC
  Filled 2013-08-19: qty 5

## 2013-08-19 MED ORDER — SODIUM CHLORIDE 0.9 % IV SOLN: 250.0000 mL | INTRAVENOUS | Status: DC | PRN

## 2013-08-19 MED ORDER — FENTANYL CITRATE 0.05 MG/ML IJ SOLN: 25.0000 ug | INTRAMUSCULAR | Status: DC | PRN

## 2013-08-19 MED ORDER — PANTOPRAZOLE SODIUM 40 MG PO TBEC
40.0000 mg | DELAYED_RELEASE_TABLET | Freq: Every day | ORAL | Status: DC
  Administered 2013-08-19 – 2013-08-20 (×2): 40 mg via ORAL
  Filled 2013-08-19 (×2): qty 1

## 2013-08-19 MED ORDER — SODIUM CHLORIDE 0.9 % IJ SOLN: 3.0000 mL | INTRAMUSCULAR | Status: DC | PRN

## 2013-08-19 MED ORDER — TEMAZEPAM 15 MG PO CAPS
15.0000 mg | ORAL_CAPSULE | Freq: Every evening | ORAL | Status: DC | PRN
  Administered 2013-08-19: 15 mg via ORAL
  Filled 2013-08-19: qty 1

## 2013-08-19 MED ORDER — PROTAMINE SULFATE 10 MG/ML IV SOLN
INTRAVENOUS | Status: DC | PRN
  Administered 2013-08-19: 40 mg via INTRAVENOUS

## 2013-08-19 MED ORDER — LIDOCAINE HCL (CARDIAC) 20 MG/ML IV SOLN
INTRAVENOUS | Status: DC | PRN
  Administered 2013-08-19: 60 mg via INTRAVENOUS

## 2013-08-19 MED ORDER — RIVAROXABAN 20 MG PO TABS
20.0000 mg | ORAL_TABLET | Freq: Every day | ORAL | Status: DC
Start: 2013-08-19 — End: 2013-08-20
  Administered 2013-08-19: 20 mg via ORAL
  Filled 2013-08-19 (×2): qty 1

## 2013-08-19 MED ORDER — PROPOFOL 10 MG/ML IV BOLUS
INTRAVENOUS | Status: DC | PRN
  Administered 2013-08-19: 180 mg via INTRAVENOUS

## 2013-08-19 MED ORDER — MIDAZOLAM HCL 5 MG/5ML IJ SOLN
INTRAMUSCULAR | Status: DC | PRN
  Administered 2013-08-19 (×2): 2 mg via INTRAVENOUS

## 2013-08-19 MED ORDER — FENTANYL CITRATE 0.05 MG/ML IJ SOLN
INTRAMUSCULAR | Status: DC | PRN
  Administered 2013-08-19: 50 ug via INTRAVENOUS
  Administered 2013-08-19: 100 ug via INTRAVENOUS
  Administered 2013-08-19: 50 ug via INTRAVENOUS
  Administered 2013-08-19 (×2): 100 ug via INTRAVENOUS
  Administered 2013-08-19 (×2): 50 ug via INTRAVENOUS

## 2013-08-19 MED ORDER — ONDANSETRON HCL 4 MG/2ML IJ SOLN
INTRAMUSCULAR | Status: DC | PRN
  Administered 2013-08-19: 4 mg via INTRAVENOUS

## 2013-08-19 MED ORDER — FENTANYL CITRATE 0.05 MG/ML IJ SOLN
INTRAMUSCULAR | Status: AC
  Administered 2013-08-19: 50 ug
  Filled 2013-08-19: qty 2

## 2013-08-19 MED ORDER — ACETAMINOPHEN 325 MG PO TABS
650.0000 mg | ORAL_TABLET | ORAL | Status: DC | PRN
  Administered 2013-08-19 (×2): 650 mg via ORAL
  Filled 2013-08-19 (×2): qty 2

## 2013-08-19 MED ORDER — LACTATED RINGERS IV SOLN
INTRAVENOUS | Status: DC | PRN
  Administered 2013-08-19 (×2): via INTRAVENOUS

## 2013-08-19 MED ORDER — ISOPROTERENOL HCL 0.2 MG/ML IJ SOLN
1000.0000 ug | INTRAVENOUS | Status: DC | PRN
  Administered 2013-08-19: 20 ug/min via INTRAVENOUS

## 2013-08-19 MED ORDER — ALPRAZOLAM 0.5 MG PO TABS
0.5000 mg | ORAL_TABLET | Freq: Three times a day (TID) | ORAL | Status: DC | PRN
  Administered 2013-08-19 – 2013-08-20 (×2): 0.5 mg via ORAL
  Filled 2013-08-19 (×2): qty 1

## 2013-08-19 MED ORDER — METOPROLOL TARTRATE 25 MG PO TABS
25.0000 mg | ORAL_TABLET | Freq: Once | ORAL | Status: AC
  Administered 2013-08-19: 25 mg via ORAL
  Filled 2013-08-19: qty 1

## 2013-08-19 MED ORDER — HEPARIN SODIUM (PORCINE) 1000 UNIT/ML IJ SOLN
INTRAMUSCULAR | Status: AC
  Filled 2013-08-19: qty 1

## 2013-08-19 MED ORDER — SODIUM CHLORIDE 0.9 % IJ SOLN
3.0000 mL | Freq: Two times a day (BID) | INTRAMUSCULAR | Status: DC
  Administered 2013-08-19 (×2): 3 mL via INTRAVENOUS

## 2013-08-19 MED ORDER — OXYCODONE HCL 5 MG PO TABS
5.0000 mg | ORAL_TABLET | Freq: Once | ORAL | Status: AC
  Administered 2013-08-19: 5 mg via ORAL
  Filled 2013-08-19: qty 1

## 2013-08-19 NOTE — Anesthesia Postprocedure Evaluation (Signed)
Anesthesia Post Note  Patient: Wayne Pollard  Procedure(s) Performed: Procedure(s) (LRB): ATRIAL FIBRILLATION ABLATION (N/A)  Anesthesia type: General  Patient location: PACU  Post pain: Pain level controlled and Adequate analgesia  Post assessment: Post-op Vital signs reviewed, Patient's Cardiovascular Status Stable, Respiratory Function Stable, Patent Airway and Pain level controlled  Last Vitals:  Filed Vitals:   08/19/13 1110  BP:   Pulse: 74  Temp:   Resp:     Post vital signs: Reviewed and stable  Level of consciousness: awake, alert  and oriented  Complications: No apparent anesthesia complications

## 2013-08-19 NOTE — Anesthesia Preprocedure Evaluation (Signed)
Anesthesia Evaluation  Patient identified by MRN, date of birth, ID band Patient awake    Reviewed: Allergy & Precautions, H&P , NPO status , Patient's Chart, lab work & pertinent test results  Airway Mallampati: II  Neck ROM: full    Dental   Pulmonary former smoker,          Cardiovascular + dysrhythmias Atrial Fibrillation     Neuro/Psych Anxiety negative neurological ROS     GI/Hepatic negative GI ROS,   Endo/Other    Renal/GU      Musculoskeletal   Abdominal   Peds  Hematology   Anesthesia Other Findings   Reproductive/Obstetrics                           Anesthesia Physical Anesthesia Plan  ASA: II  Anesthesia Plan: MAC   Post-op Pain Management:    Induction: Intravenous  Airway Management Planned: Simple Face Mask  Additional Equipment:   Intra-op Plan:   Post-operative Plan:   Informed Consent: I have reviewed the patients History and Physical, chart, labs and discussed the procedure including the risks, benefits and alternatives for the proposed anesthesia with the patient or authorized representative who has indicated his/her understanding and acceptance.     Plan Discussed with: CRNA, Anesthesiologist and Surgeon  Anesthesia Plan Comments:         Anesthesia Quick Evaluation

## 2013-08-19 NOTE — Op Note (Signed)
SURGEON:  Hillis Range, MD  PREPROCEDURE DIAGNOSES: 1. Paroxysmal atrial fibrillation.  POSTPROCEDURE DIAGNOSES: 1. Paroxysmal  atrial fibrillation.  PROCEDURES: 1. Comprehensive electrophysiologic study. 2. Coronary sinus pacing and recording. 3. Three-dimensional mapping of atrial fibrillation with additional mapping and ablation of a second right atrial focus 4. Ablation of atrial fibrillation with additional mapping and ablation of a second right atrial focus 5. Intracardiac echocardiography. 6. Transseptal puncture of an intact septum. 7. Rotational Angiography with processing at an independent workstation 8. Arrhythmia induction with pacing with isuprel infusion  INTRODUCTION:  Wayne Pollard is a 47 y.o. male with a history of paroxysmal atrial fibrillation who now presents for EP study and radiofrequency ablation.  The patient reports initially being diagnosed with atrial fibrillation after presenting with symptomatic palpitations and fatgiue. The patient reports increasing frequency and duration of atrial fibrillation since that time.  The patient has failed medical therapy with flecainide, diltiazem, and metoprolol.  The patient therefore presents today for catheter ablation of atrial fibrillation.  DESCRIPTION OF PROCEDURE:  Informed written consent was obtained, and the patient was brought to the electrophysiology lab in a fasting state.  The patient was adequately sedated with intravenous medications as outlined in the anesthesia report.  The patient's left and right groins were prepped and draped in the usual sterile fashion by the EP lab staff.  Using a percutaneous Seldinger technique, two 7-French and one 11-French hemostasis sheaths were placed into the right common femoral vein.  3 Dimensional Rotational Angiography: A 5 french pigtail catheter was introduced through the right common femoral vein and advanced into the inferior venocava.  3 demential rotational angiography  was then performed by power injection of 100cc of nonionic contrast.  Reprocessing at an independent work station was then performed.   This demonstrated a moderate sized left atrium with 4 separate pulmonary veins which were also moderate in size.  There were no anomalous veins or significant abnormalities.  A 3 dimensional rendering of the left atrium was then merged using [employer] onto the Well[employer] system and registered with intracardiac echo (see below).  The pigtail catheter was then removed.  Catheter Placement:  A 7-French Biosense Webster Decapolar coronary sinus catheter was introduced through the right common femoral vein and advanced into the coronary sinus for recording and pacing from this location.  A 6-French quadripolar Josephson catheter was introduced through the right common femoral vein and advanced into the right ventricle for recording and pacing.  This catheter was then pulled back to the His bundle location.    Initial Measurements: The patient presented to the electrophysiology lab in sinus rhythm.  His PR interval measured 142 msec with a QRS duration of 102 msec and a QT interval of 446 msec.  The AH interval measured 54 msec and the HV interval measured 47 msec.     Intracardiac Echocardiography: A 10-French Biosense Webster AcuNav intracardiac echocardiography catheter was introduced through the left common femoral vein and advanced into the right atrium. Intracardiac echocardiography was performed of the left atrium, and a three-dimensional anatomical rendering of the left atrium was performed using CARTO sound technology.  The patient was noted to have a moderate sized left atrium.  The interatrial septum was prominent but not aneurysmal. All 4 pulmonary veins were visualized and noted to have separate ostia.  The pulmonary veins were moderate in size.  The left atrial appendage was visualized and did not reveal thrombus.   There was no evidence of  pulmonary  vein stenosis.   Transseptal Puncture: The middle right common femoral vein sheath was exchanged for an 8.5 [place] SL2 transseptal sheath and transseptal access was achieved in a standard fashion using a Brockenbrough needle under biplane fluoroscopy with intracardiac echocardiography confirmation of the transseptal puncture.  Once transseptal access had been achieved, heparin was administered intravenously and intra- arterially in order to maintain an ACT of greater than 300 seconds throughout the procedure.   3D Mapping and Ablation: The His bundle catheter was removed and in its place a 3.5 mm [employer] Thermocool ablation catheter was advanced into the right atrium.  The transseptal sheath was pulled back into the IVC over a guidewire.  The ablation catheter was advanced across the transseptal hole using the wire as a guide.  The transseptal sheath was then re-advanced over the guidewire into the left atrium.  A duodecapolar Biosense Webster circular mapping catheter was introduced through the transseptal sheath and positioned over the mouth of all 4 pulmonary veins.  Three-dimensional electroanatomical mapping was performed using CARTO technology.  This demonstrated electrical activity within all four pulmonary veins at baseline. The patient underwent successful sequential electrical isolation and anatomical encircling of all four pulmonary veins using radiofrequency current with a circular mapping catheter as a guide.   The circular mapping catheter was pulled back into the right atrium and 3D mapping was performed at the junction of the superior vena cava and right atrium.  Electrical activity was observed within the SVC.  I therefore elected to perform right atrial ablation in this area.  A series of radiofrequency applications were delivered in a circular fashion around the ostium of the SVC.  Prior to each ablation lesions, pacing was performed from the distal ablation  electrode to insure that diaphragmatic stimulation was not observed to avoid phrenic nerve injury.  Diaphragmatic excursion was also observed during ablation.    Measurements Following Ablation: Following ablation, Isuprel was infused up to 20 mcg/min with no inducible atrial fibrillation, atrial tachycardia, atrial flutter, or sustained PACs. In sinus rhythm with RR interval was 849 msec, with PR 139 msec, QRS 89 msec, and Qt 389 msec.  Following ablation the AH interval measured 61 msec with an HV interval of 47 msec. Ventricular pacing was performed, which revealed midline decremental VA conduction with a VA Wenckebach cycle length of 320 msec.  Rapid atrial pacing was performed, which revealed an AV Wenckebach cycle length of 280 msec.  The patient developed abberant wide QRS conduction (LBB with Q wave in I) with rapid atrial pacing at 300 msec with 1:1 conduction.  Electroisolation was then again confirmed in all four pulmonary veins.  Pacing was performed along the ablation line which confirmed entrance and exit block.  The procedure was therefore considered completed.  All catheters were removed, and the sheaths were aspirated and flushed.  The patient was transferred to the recovery area for sheath removal per protocol.  A limited bedside transthoracic echocardiogram revealed no pericardial effusion.  There were no early apparent complications.  CONCLUSIONS: 1. Sinus rhythm upon presentation.   2. Rotational Angiography reveals a moderate sized left atrium with four separate pulmonary veins without evidence of pulmonary vein stenosis. 3. Successful electrical isolation and anatomical encircling of all four pulmonary veins with radiofrequency current.  Additional right atrial atrial ablation was performed at the junction of the SVT and right atrium. 4. No inducible arrhythmias following ablation both on and off of Isuprel 5. No early apparent complications.  Fayrene Fearing Fletcher Rathbun,MD 10:47  AM 08/19/2013

## 2013-08-19 NOTE — Progress Notes (Signed)
Ventricular bigeminy noted on monitor. New order from Dr. Shirlee Latch 25mg  metoprolol PO once

## 2013-08-19 NOTE — Transfer of Care (Signed)
Immediate Anesthesia Transfer of Care Note  Patient: Wayne Pollard  Procedure(s) Performed: Procedure(s): ATRIAL FIBRILLATION ABLATION (N/A)  Patient Location: PACU  Anesthesia Type:General  Level of Consciousness: awake, alert , oriented and patient cooperative  Airway & Oxygen Therapy: Patient Spontanous Breathing and Patient connected to nasal cannula oxygen  Post-op Assessment: Report given to PACU RN, Post -op Vital signs reviewed and stable and Patient moving all extremities  Post vital signs: Reviewed and stable  Complications: No apparent anesthesia complications

## 2013-08-19 NOTE — Discharge Summary (Signed)
ELECTROPHYSIOLOGY PROCEDURE DISCHARGE SUMMARY    Patient ID: Wayne Pollard,  MRN: [id number], DOB/AGE: [birth year] 47 y.o.  Admit date: 08/19/2013 Discharge date: 08/20/2013  Primary Care Physician: Alexia Freestone, MD Primary Cardiologist: Peter Swaziland, MD Electrophysiologist: Hillis Range, MD  Primary Discharge Diagnosis:  Paroxysmal atrial fibrillation status post ablation this admission  Secondary Discharge Diagnosis:  1.  Anxiety 2.  S/p appendectomy 3.  S/p cholecystectomy  Procedures This Admission:  1.  Electrophysiology study and radiofrequency catheter ablation on 08-19-2013 by Dr Hillis Range.  This study demonstrated sinus rhythm upon presentation; rotational Angiography reveals a moderate sized left atrium with four separate pulmonary veins without evidence of pulmonary vein stenosis; successful electrical isolation and anatomical encircling of all four pulmonary veins with radiofrequency current. Additional right atrial atrial ablation was performed at the junction of the SVT and right atrium. There were no inducible arrhythmias following ablation both on and off of Isuprel and no early apparent complications  Brief HPI: Wayne Pollard is a 47 y.o. male with a history of paroxysmal atrial fibrillation.  He has failed medical therapy with Flecainide, Diltiazem, and Metoprolol. Risks, benefits, and alternatives to catheter ablation of atrial fibrillation were reviewed with the patient who wished to proceed.  The patient underwent TEE prior to the procedure which demonstrated normal LV function and no LAA thrombus.    Hospital Course:  The patient was admitted and underwent EPS/RFCA of atrial fibrillation with details as outlined above.  They were monitored on telemetry overnight which demonstrated sinus rhythm with occasional ventricular bigeminy.  Groin was without complication on the day of discharge.  The patient was examined by Dr Johney Frame and considered to be stable for  discharge.  Wound care and restrictions were reviewed with the patient.  The patient will be seen back by Dr Johney Frame in 12 weeks for post ablation follow up.   Discharge Vitals: Blood pressure 130/74, pulse 70, temperature 98.7 F (37.1 C), temperature source Oral, resp. rate 24, height 6' (1.829 m), weight 192 lb (87.091 kg), SpO2 99.00%.      Physical Exam: Filed Vitals:   08/20/13 0400 08/20/13 0500 08/20/13 0605 08/20/13 0800  BP: 94/41 119/74 123/84 130/74  Pulse:      Temp:    98.7 F (37.1 C)  TempSrc:    Oral  Resp: 21 22 16 24   Height:      Weight:      SpO2:    99%    GEN- The patient is well appearing, alert and oriented x 3 today.   Head- normocephalic, atraumatic Eyes-  Sclera clear, conjunctiva pink Ears- hearing intact Oropharynx- clear,  There is a tooth that is loose along the L upper biscupid area Neck- supple  Lungs- Clear to ausculation bilaterally, normal work of breathing Heart- Regular rate and rhythm, no murmurs, rubs or gallops, PMI not laterally displaced GI- soft, NT, ND, + BS Extremities- no clubbing, cyanosis, or edema, no hematoma/ bruit MS- no significant deformity or atrophy Skin- no rash or lesion Psych- euthymic mood, full affect Neuro- strength and sensation are intact  Labs:   Lab Results  Component Value Date   WBC 8.2 08/12/2013   HGB 14.8 08/12/2013   HCT 43.0 08/12/2013   MCV 93.3 08/12/2013   PLT 181.0 08/12/2013     Recent Labs Lab 08/20/13 0538  NA 138  K 4.0  CL 104  CO2 27  BUN 14  CREATININE 0.97  CALCIUM 8.7  GLUCOSE 95  Discharge Medications:    Medication List    STOP taking these medications       aspirin EC 81 MG tablet     flecainide 50 MG tablet  Commonly known as:  TAMBOCOR      TAKE these medications       ALPRAZolam 0.5 MG tablet  Commonly known as:  XANAX  Take 0.5 mg by mouth 3 (three) times daily as needed.     calcipotriene-betamethasone ointment  Commonly known as:  TACLONEX    Apply 1 application topically 3 (three) times daily as needed.     omeprazole 20 MG capsule  Commonly known as:  PRILOSEC  Take 20 mg by mouth 2 (two) times daily.     pindolol 5 MG tablet  Commonly known as:  VISKEN  Take 0.5 tablets (2.5 mg total) by mouth 2 (two) times daily.     Rivaroxaban 20 MG Tabs tablet  Commonly known as:  XARELTO  Take 1 tablet (20 mg total) by mouth daily.     temazepam 15 MG capsule  Commonly known as:  RESTORIL  Take 1 capsule (15 mg total) by mouth at bedtime as needed for sleep.       Duration of Discharge Encounter: Greater than 30 minutes including physician time.  Signed,  Hillis Range MD

## 2013-08-19 NOTE — Addendum Note (Signed)
Addendum created 08/19/13 1702 by Shireen Quan, CRNA   Modules edited: Anesthesia Events

## 2013-08-19 NOTE — H&P (View-Only) (Signed)
Primary Care Physician: [name], MD Referring Physician:  Dr Jordan   Wayne Pollard is a 47 y.o. male with a h/o recently diagnosed atrial fibrillation who presents today for EP consultation.  He reports initially being diagnosed with atrial fibrillation several years ago after presenting to [facility] with afib with RVR.  He reports that he has previously been able to terminate episodes with vagal maneuvers.  Episodes have increased in frequency and duration recently.  Recently, he has had bradycardia with his afib.  He had syncope with an episode of afib recently for which he presented to [facility].  He was evaluated by Dr Brackbill and felt to have syncope related to a vagal event.  He has been treated with flecainide, metoprolol, and diltiazem but has not found these to be well tolerated.  He feels that his afib continues to increase in frequency and duration despite medical therapy.  He reports symptoms of fatigue and decreased exercise tolerance with associated dizziness during afib. Today, he denies symptoms of  chest pain, shortness of breath, orthopnea, PND, lower extremity edema, or neurologic sequela. The patient is tolerating medications without difficulties and is otherwise without complaint today.  The echocardiogram noted that he was in atrial fibrillation and that he had normal left ventricular function with ejection fraction of 60-65% with mild LVH. There was mild left atrial enlargement. He states that when he ago he had a stress test in [place] which was normal.  Past Medical History  Diagnosis Date  . Anxiety   . Paroxysmal atrial fibrillation    Past Surgical History  Procedure Laterality Date  . Appendectomy    . Cholecystectomy    . Hernia repair    . Cardiac catheterization      [facility], Pt reports no CAD    Current Outpatient Prescriptions  Medication Sig Dispense Refill  . ALPRAZolam (XANAX) 0.5 MG tablet Take 0.5 mg by mouth 2  (two) times daily as needed.       . aspirin 81 MG tablet Take 1 tablet (81 mg total) by mouth daily.      . flecainide (TAMBOCOR) 50 MG tablet Take 50 mg by mouth 2 (two) times daily.      . omeprazole (PRILOSEC OTC) 20 MG tablet Take 20 mg by mouth 2 (two) times daily.      . pindolol (VISKEN) 5 MG tablet Take 0.5 tablets (2.5 mg total) by mouth 2 (two) times daily.  30 tablet  6  . temazepam (RESTORIL) 15 MG capsule Take 1 capsule (15 mg total) by mouth at bedtime as needed for sleep.  14 capsule  0   No current facility-administered medications for this visit.    Allergies  Allergen Reactions  . Codeine     History   Social History  . Marital Status: Married    Spouse Name: N/A    Number of Children: 2  . Years of Education: N/A   Occupational History  . communications    Social History Main Topics  . Smoking status: Former Smoker    Types: Cigarettes    Quit date: 12/24/2012  . Smokeless tobacco: Not on file  . Alcohol Use: No  . Drug Use: No  . Sexual Activity: Not on file   Other Topics Concern  . Not on file   Social History Narrative   Pt lives in  with spouse.  Owns a communications company    Family History  Problem Relation   Age of Onset  . Hypertension Mother   . Heart attack Father   . Heart failure Father     ROS- All systems are reviewed and negative except as per the HPI above  Physical Exam: Filed Vitals:   07/30/13 1041  BP: 124/80  Pulse: 60  Height: 6' (1.829 m)  Weight: 192 lb 12.8 oz (87.454 kg)    GEN- The patient is well appearing, alert and oriented x 3 today.   Head- normocephalic, atraumatic Eyes-  Sclera clear, conjunctiva pink Ears- hearing intact Oropharynx- clear Neck- supple, no JVP Lymph- no cervical lymphadenopathy Lungs- Clear to ausculation bilaterally, normal work of breathing Heart- Regular rate and rhythm, no murmurs, rubs or gallops, PMI not laterally displaced GI- soft, NT, ND, + BS Extremities- no  clubbing, cyanosis, or edema MS- no significant deformity or atrophy Skin- no rash or lesion Psych- euthymic mood, full affect Neuro- strength and sensation are intact  EKG today reveals sinus rhythm 62 bpm, PR 146, Qtc 424, otherwise normal ekg ekg 01/06/13 reveals afib with RVR Echo 01/06/13- EF 60-65%, LA size 41mm, trace MR  Assessment and Plan:  1. Paroxysmal atrial fibrillation The patient has symptomatic recurrent paroxysmal atrial fibrillation.  He has failed medical therapy with flecainide, metoprolol and diltiazem.  Therapeutic strategies for afib including medicine and ablation were discussed in detail with the patient today. Risk, benefits, and alternatives to EP study and radiofrequency ablation for afib were also discussed in detail today. These risks include but are not limited to stroke, bleeding, vascular damage, tamponade, perforation, damage to the esophagus, lungs, and other structures, pulmonary vein stenosis, worsening renal function, and death. The patient understands these risk and wishes to proceed.  We will therefore proceed with catheter ablation once the patient has been adequately anticoagulated.  Xarelto 20mg daily is initiated today.  

## 2013-08-19 NOTE — Interval H&P Note (Signed)
History and Physical Interval Note:  08/19/2013 7:51 AM  Wayne Pollard  has presented today for surgery, with the diagnosis of afib  The various methods of treatment have been discussed with the patient and family. After consideration of risks, benefits and other options for treatment, the patient has consented to  Procedure(s): ATRIAL FIBRILLATION ABLATION (N/A) as a surgical intervention .  The patient's history has been reviewed, patient examined, no change in status, stable for surgery.  I have reviewed the patient's chart and labs.  Questions were answered to the patient's satisfaction.     Hillis Range

## 2013-08-20 DIAGNOSIS — I4891 Unspecified atrial fibrillation: Secondary | ICD-10-CM

## 2013-08-20 LAB — BASIC METABOLIC PANEL
CO2: 27 mEq/L (ref 19–32)
Calcium: 8.7 mg/dL (ref 8.4–10.5)
Chloride: 104 mEq/L (ref 96–112)
Creatinine, Ser: 0.97 mg/dL (ref 0.50–1.35)
Glucose, Bld: 95 mg/dL (ref 70–99)
Potassium: 4 mEq/L (ref 3.5–5.1)

## 2013-08-20 NOTE — Progress Notes (Signed)
Pt dc home with wife via wheelchair. States understanding of dc instructions. Dc home with belongings.   Tammy Sours

## 2013-08-24 ENCOUNTER — Encounter (HOSPITAL_COMMUNITY): Payer: Self-pay | Admitting: *Deleted

## 2013-09-04 ENCOUNTER — Encounter (HOSPITAL_COMMUNITY): Payer: Self-pay | Admitting: Anesthesiology

## 2013-10-09 ENCOUNTER — Telehealth: Payer: Self-pay | Admitting: Adult Health

## 2013-10-09 NOTE — Telephone Encounter (Signed)
Received call from his wife that he felt himself go back into atrial fibrillation this am around 8:30. He is one month s/p ablation per Dr. Johney Frame. He is not on any rate reducing medications or antiarrhythmia's. He continues on Xarelto.  Wife states he is asymptomatic, HR in the "30's."  He denies chest pain or dyspnea. Has mild nausea.   I have advised him to continue with current medications. If becomes symptomatic will need to be seen in ER. I will call office to set up appt for sooner follow up. He is scheduled to have appt in 3 months on recent discharge.

## 2013-10-10 ENCOUNTER — Telehealth: Payer: Self-pay | Admitting: Internal Medicine

## 2013-10-10 ENCOUNTER — Encounter: Payer: Self-pay | Admitting: Cardiology

## 2013-10-10 NOTE — Telephone Encounter (Signed)
New message     Pt had ablation on 08-20-13.  He has been in afib since yesterday.  His heart rate today is all over the place---ranging from 140 to 40 to 80 to 40 to 140, etc.  Need advise.

## 2013-10-10 NOTE — Telephone Encounter (Signed)
Spoke with patient's wife and I have asked her to bring him in for an EKG to assess his rhythm

## 2013-10-12 ENCOUNTER — Inpatient Hospital Stay (HOSPITAL_COMMUNITY)
Admission: EM | Admit: 2013-10-12 | Discharge: 2013-10-14 | DRG: 312 | Disposition: A | Discharge status: Home / Self Care | Payer: BC Managed Care – PPO | Attending: Cardiovascular Disease | Admitting: Cardiovascular Disease

## 2013-10-12 ENCOUNTER — Encounter (HOSPITAL_COMMUNITY): Payer: Self-pay | Admitting: Emergency Medicine

## 2013-10-12 ENCOUNTER — Emergency Department (HOSPITAL_COMMUNITY): Payer: BC Managed Care – PPO

## 2013-10-12 DIAGNOSIS — Z87891 Personal history of nicotine dependence: Secondary | ICD-10-CM

## 2013-10-12 DIAGNOSIS — R079 Chest pain, unspecified: Secondary | ICD-10-CM

## 2013-10-12 DIAGNOSIS — E860 Dehydration: Secondary | ICD-10-CM | POA: Diagnosis present

## 2013-10-12 DIAGNOSIS — I495 Sick sinus syndrome: Secondary | ICD-10-CM | POA: Diagnosis present

## 2013-10-12 DIAGNOSIS — R001 Bradycardia, unspecified: Secondary | ICD-10-CM

## 2013-10-12 DIAGNOSIS — I498 Other specified cardiac arrhythmias: Secondary | ICD-10-CM

## 2013-10-12 DIAGNOSIS — R000 Tachycardia, unspecified: Secondary | ICD-10-CM

## 2013-10-12 DIAGNOSIS — R55 Syncope and collapse: Principal | ICD-10-CM

## 2013-10-12 DIAGNOSIS — I4891 Unspecified atrial fibrillation: Secondary | ICD-10-CM | POA: Diagnosis present

## 2013-10-12 DIAGNOSIS — F411 Generalized anxiety disorder: Secondary | ICD-10-CM | POA: Diagnosis present

## 2013-10-12 DIAGNOSIS — Z79899 Other long term (current) drug therapy: Secondary | ICD-10-CM

## 2013-10-12 DIAGNOSIS — R002 Palpitations: Secondary | ICD-10-CM

## 2013-10-12 HISTORY — DX: Bradycardia, unspecified: R00.1

## 2013-10-12 HISTORY — DX: Chest pain, unspecified: R07.9

## 2013-10-12 HISTORY — DX: Syncope and collapse: R55

## 2013-10-12 LAB — CBC
HCT: 40.5 % (ref 39.0–52.0)
MCH: 33.2 pg (ref 26.0–34.0)
MCV: 93.3 fL (ref 78.0–100.0)
RDW: 12.3 % (ref 11.5–15.5)
WBC: 7.5 10*3/uL (ref 4.0–10.5)

## 2013-10-12 LAB — TROPONIN I: Troponin I: <0.3 ng/mL (ref <0.30)

## 2013-10-12 LAB — BASIC METABOLIC PANEL
BUN: 14 mg/dL (ref 6–23)
CO2: 27 mEq/L (ref 19–32)
Calcium: 8.6 mg/dL (ref 8.4–10.5)
Chloride: 106 mEq/L (ref 96–112)
Creatinine, Ser: 0.79 mg/dL (ref 0.50–1.35)
GFR calc Af Amer: >90 mL/min (ref >90)
GFR calc non Af Amer: >90 mL/min (ref >90)
Potassium: 3.9 mEq/L (ref 3.5–5.1)
Sodium: 139 mEq/L (ref 135–145)

## 2013-10-12 LAB — POCT I-STAT TROPONIN I: Troponin i, poc: 0 ng/mL (ref 0.00–0.08)

## 2013-10-12 LAB — PRO B NATRIURETIC PEPTIDE: Pro B Natriuretic peptide (BNP): 117.8 pg/mL (ref 0–125)

## 2013-10-12 LAB — MAGNESIUM: Magnesium: 1.9 mg/dL (ref 1.5–2.5)

## 2013-10-12 MED ORDER — PANTOPRAZOLE SODIUM 40 MG PO TBEC
40.0000 mg | DELAYED_RELEASE_TABLET | Freq: Every day | ORAL | Status: DC
  Administered 2013-10-13 – 2013-10-14 (×2): 40 mg via ORAL
  Filled 2013-10-12 (×2): qty 1

## 2013-10-12 MED ORDER — ACETAMINOPHEN 325 MG PO TABS
650.0000 mg | ORAL_TABLET | Freq: Once | ORAL | Status: AC
  Administered 2013-10-12: 650 mg via ORAL
  Filled 2013-10-12: qty 2

## 2013-10-12 MED ORDER — TEMAZEPAM 15 MG PO CAPS
15.0000 mg | ORAL_CAPSULE | Freq: Every evening | ORAL | Status: DC | PRN
  Administered 2013-10-12 – 2013-10-13 (×2): 15 mg via ORAL
  Filled 2013-10-12 (×2): qty 1

## 2013-10-12 MED ORDER — ACETAMINOPHEN 325 MG PO TABS
650.0000 mg | ORAL_TABLET | ORAL | Status: DC | PRN
  Administered 2013-10-12: 650 mg via ORAL
  Filled 2013-10-12: qty 2

## 2013-10-12 MED ORDER — MORPHINE SULFATE 4 MG/ML IJ SOLN
4.0000 mg | Freq: Once | INTRAMUSCULAR | Status: AC
  Administered 2013-10-12: 4 mg via INTRAVENOUS
  Filled 2013-10-12: qty 1

## 2013-10-12 MED ORDER — RIVAROXABAN 20 MG PO TABS
20.0000 mg | ORAL_TABLET | Freq: Every day | ORAL | Status: DC
Start: 2013-10-13 — End: 2013-10-14
  Administered 2013-10-13 – 2013-10-14 (×2): 20 mg via ORAL
  Filled 2013-10-12 (×2): qty 1

## 2013-10-12 MED ORDER — ALPRAZOLAM 0.5 MG PO TABS
0.5000 mg | ORAL_TABLET | Freq: Three times a day (TID) | ORAL | Status: DC | PRN
  Administered 2013-10-12 – 2013-10-13 (×2): 0.5 mg via ORAL
  Filled 2013-10-12 (×2): qty 1

## 2013-10-12 MED ORDER — ONDANSETRON HCL 4 MG/2ML IJ SOLN: 4.0000 mg | Freq: Four times a day (QID) | INTRAMUSCULAR | Status: DC | PRN

## 2013-10-12 NOTE — H&P (Signed)
The patient was seen and examined, and I agree with the assessment and plan as documented above. His syncopal episode was preceded by 30 seconds of "deep, sharp, stinging chest pain". His cath in 04/2010 was unremarkable.  First troponin is normal. Would continue to cycle troponins to rule out ischemia as an underlying etiology. One could consider outpatient stress testing, but in light of no significant predisposing factors to aggressive CAD other than a prior h/o tobacco use (no HTN, hyperlipidemia, or diabetes), it is unlikely that he has developed hemodynamically significant CAD in the interim period. Had normal LV systolic function (EF 55-60%) by echo in 08/2013. His syncopal episode may have been orthostatic in nature, and may have been precipitated by pindolol (albeit a low dose), which I will hold. He has had frequent atrial and ventricular bigeminy. One could consider alternate medical therapy (short acting diltiazem perhaps) to suppress these. Will check magnesium levels to see if there is an readily correctable electrolyte abnormality. Given his reported h/o tachycardia in the past two days, will monitor on telemetry for recurrence of atrial fibrillation or other tachyarrhythmias. Continue Xarelto. Will have EP see on 12/29.

## 2013-10-12 NOTE — ED Provider Notes (Signed)
CSN: [id number]     Arrival date & time 10/12/13  1127 History   First MD Initiated Contact with Patient 10/12/13 1159     Chief Complaint  Patient presents with  . Loss of Consciousness  . Chest Pain   (Consider location/radiation/quality/duration/timing/severity/associated sxs/prior Treatment) HPI Wayne Pollard is a 47 y.o. male who presents to emergency department with complaint of dizziness, chest pain, syncopal episode. Patient states that he was in the house and was about to do "something" when he fainted. Patient does not remember what he was doing just prior to the syncopal episode. He doesn't remember getting dizzy or lightheaded. He states that he has been having intermittent dizziness and chest pains for the last 3 days. He states he went to his doctor 2 days ago because he thought he was in A. fib, states they checked his EKG and he had sinus rhythm with multiple PVCs. According to patient's wife who was in the house the patient syncopized, patient was unconscious for approximately a minute or 2 after which he was complaining of nausea and chest pain. Patient states that he continues to have chest pain. He got 3 sublingual nitroglycerin by EMS and 224 mg of aspirin. Patient states that it is not his chest pain but now he is having a headache. States also currently has dizziness. Patient reports history of A. fib and had an ablation on 08/20/13. Patient denies any coronary artery disease or CHF. According to EMS, HR was 30 upon arrival, SR and PVC bigeminy on the strip  Past Medical History  Diagnosis Date  . Anxiety   . Paroxysmal atrial fibrillation     s/p PVI 08/2013   Past Surgical History  Procedure Laterality Date  . Appendectomy    . Cholecystectomy    . Hernia repair    . Cardiac catheterization      [facility], Pt reports no CAD  . Tee without cardioversion N/A 08/18/2013    Procedure: TRANSESOPHAGEAL ECHOCARDIOGRAM (TEE);  Surgeon: Lewayne Bunting, MD;   Location: [facility] ENDOSCOPY;  Service: Cardiovascular;  Laterality: N/A;  . Ablation  08/19/2013    PVI by Dr Johney Frame   Family History  Problem Relation Age of Onset  . Hypertension Mother   . Heart attack Father   . Heart failure Father    History  Substance Use Topics  . Smoking status: Former Smoker    Types: Cigarettes    Quit date: 12/24/2012  . Smokeless tobacco: Not on file  . Alcohol Use: No    Review of Systems  Constitutional: Negative for fever and chills.  Respiratory: Positive for chest tightness. Negative for cough and shortness of breath.   Cardiovascular: Positive for chest pain and palpitations. Negative for leg swelling.  Gastrointestinal: Positive for nausea. Negative for vomiting, abdominal pain, diarrhea and abdominal distention.  Genitourinary: Negative for dysuria, urgency, frequency and hematuria.  Musculoskeletal: Negative for arthralgias, myalgias, neck pain and neck stiffness.  Skin: Negative for rash.  Allergic/Immunologic: Negative for immunocompromised state.  Neurological: Positive for dizziness. Negative for weakness, light-headedness, numbness and headaches.    Allergies  Codeine  Home Medications   Current Outpatient Rx  Name  Route  Sig  Dispense  Refill  . ALPRAZolam (XANAX) 0.5 MG tablet   Oral   Take 0.5 mg by mouth 3 (three) times daily as needed for anxiety.          . calcipotriene-betamethasone (TACLONEX) ointment   Topical   Apply 1  application topically 3 (three) times daily as needed (for psoriasis).          Marland Kitchen omeprazole (PRILOSEC) 20 MG capsule   Oral   Take 20 mg by mouth 2 (two) times daily.         . pindolol (VISKEN) 5 MG tablet   Oral   Take 0.5 tablets (2.5 mg total) by mouth 2 (two) times daily.   30 tablet   6     Discontinue cardizem and metoprolol due to brady   . Rivaroxaban (XARELTO) 20 MG TABS tablet   Oral   Take 1 tablet (20 mg total) by mouth daily.   30 tablet   11   . temazepam (RESTORIL)  15 MG capsule   Oral   Take 1 capsule (15 mg total) by mouth at bedtime as needed for sleep.   14 capsule   0    BP 102/52  Pulse 67  Temp(Src) 98.2 F (36.8 C) (Oral)  Resp 23  SpO2 98% Physical Exam  Nursing note and vitals reviewed. Constitutional: He is oriented to person, place, and time. He appears well-developed and well-nourished. No distress.  HENT:  Head: Normocephalic and atraumatic.  Eyes: Conjunctivae are normal.  Neck: Neck supple.  Cardiovascular: Normal rate, regular rhythm and normal heart sounds.   Pulmonary/Chest: Effort normal. No respiratory distress. He has no wheezes. He has no rales. He exhibits no tenderness.  Abdominal: Soft. Bowel sounds are normal. He exhibits no distension. There is no tenderness. There is no rebound.  Musculoskeletal: He exhibits no edema.  Neurological: He is alert and oriented to person, place, and time. No cranial nerve deficit.  Skin: Skin is warm and dry.    ED Course  Procedures (including critical care time) Labs Review Labs Reviewed  CBC  BASIC METABOLIC PANEL  PRO B NATRIURETIC PEPTIDE  MAGNESIUM  POCT I-STAT TROPONIN I   Imaging Review Dg Chest 2 View  10/12/2013   CLINICAL DATA:  Atrial fibrillation.  Syncope.  EXAM: CHEST  2 VIEW  COMPARISON:  06/19/2013  FINDINGS: Cardiac silhouette is normal in size. Aorta is minimally uncoiled. No mediastinal or hilar masses.  The lungs are clear.  No pleural effusion or pneumothorax.  The bony thorax is intact.  IMPRESSION: No active cardiopulmonary disease.   Electronically Signed   By: Amie Portland M.D.   On: 10/12/2013 13:48    EKG Interpretation   None       MDM   1. Bradycardia   2. Atrial fibrillation   3. Chest pain   4. Palpitations   5. Syncope   6. Atrial bigeminy   7. Ventricular bigeminy   8. Tachycardia     Patient with a syncopal episode today which was followed by chest pain and and dizziness. His chest pain resolved with nitroglycerin by EMS.  Patient not having headache. He denies having headache prior to taking nitroglycerin, doubt head injury. His EKG shows sinus rhythm with multiple PVCs. Labs are all unremarkable, chest x-ray normal. Discussed with cardiology they will come by and see patient.  Filed Vitals:   10/12/13 1250 10/12/13 1255 10/12/13 1352 10/12/13 1534  BP: 119/67 119/67 108/67 124/95  Pulse: 79 64 60 74  Temp:      TempSrc:      Resp:  29 22 20   SpO2:  100% 97% 98%       Lottie Mussel, PA-C 10/12/13 1557

## 2013-10-12 NOTE — H&P (Signed)
Patient ID: Wayne Pollard MRN: [id number], DOB/AGE: October 15, 1966   Admit date: 10/12/2013   Primary Physician: Raquel Sarna, MD Primary Cardiologist: P. Swazil[place], MD / Shela Commons. Allred, MD   Pt. Profile:  47 year old male with history of paroxysmal atrial fibrillation status post pulmonary vein isolation November 2014 who presented to the ED this morning with progressive fatigue followed by syncopal episode.   Problem List  Past Medical History  Diagnosis Date  . Anxiety   . Paroxysmal atrial fibrillation     a. previously failed flecainide/dilt/lopressor;  b. 08/2013 TEE: EF 55-60%, no rwma, no LAA thrombus or veg;  c. s/p PVI 08/2013;  d. chronic xarelto.  . Chest pain     a. 04/2010 Cath: essentially nl cors, nl EF.    Past Surgical History  Procedure Laterality Date  . Appendectomy    . Cholecystectomy    . Hernia repair    . Cardiac catheterization      [facility], Pt reports no CAD  . Tee without cardioversion N/A 08/18/2013    Procedure: TRANSESOPHAGEAL ECHOCARDIOGRAM (TEE);  Surgeon: Lewayne Bunting, MD;  Location: [facility] ENDOSCOPY;  Service: Cardiovascular;  Laterality: N/A;  . Ablation  08/19/2013    PVI by Dr Johney Frame    Allergies  Allergies  Allergen Reactions  . Codeine Nausea And Vomiting   HPI  47 year old male with history of paroxysmal atrial fibrillation. He is present on flecainide, diltiazem, and metoprolol. Despite drug therapy, he had continued to have paroxysms of A. fib. In November of this year, he underwent transesophageal echocardiogram which showed no evidence of left atrial appendage thrombus. He subsequently underwent pulmonary vein isolation/A. fib ablation, which was successful. Following discharge, flecainide was discontinued. He thought he was supposed to discontinue his pindolol as well and so he did.  He felt great following his ablation and did not have any recurrent paroxysms of afib at all.  On Christmas morning however, he began to  experience significant fatigue, wkns, washed out.  He owns a portably pulse-ox and noted HR's in the 30's throughout the day on Christmas. He continued to feel weak and washed out and called in to the office on December 26. He came into the office that day and an ECG was performed. There is no note regarding that visit an ECG is not available for review however patient states that he was told that his home monitor was only taking every other beat because he was having extra beats. I presume this to mean that he was having ventricular bigeminy. He was told to resume pindolol at 2.5 mg twice a day and so he did. Unfortunately, he is continued to feel weak and washed out without chest pain or dyspnea. He has periodically checked his heart rate on his home monitor and for the most part is noted rates in the 30s but at times is noted rates in the 1 teens and yesterday in the setting of tachycardia and palpitations, had a rate in the 180s. That lasted just a few seconds and resolve spontaneously.  This morning, he continued to feel weak and washed out and while coming inside after getting the newspaper had an episode of sharp chest pain which lasted just a few seconds and resolve spontaneously. This occurred again while he was waiting for his mother-in-law at a dialysis center. When he returned home, he again noted tachycardia and palpitations and his heart monitor showed a heart rate of 240. This again resolved spontaneously  within a minute or so. Later, he was sitting in his recliner and decided to get up and take something to his church. When he sat up in a recliner he felt a little bit lightheaded and then after standing, without warning, he lost consciousness and fell to the floor. His fall was not witnessed but his wife did hear him fall. Upon finding him, he was unresponsive and lying on his back. His breathing is somewhat shallow. EMS was called and patient was found to have double atrial bigeminy and  ventricular bigeminy with rates in the 60s. Patient finally regained consciousness while on the ambulance on the way to [facility]. Here, he is continued to have ventricular bigeminy with rates in the 60s. Continues to report weakness and fatigue without chest pain or dyspnea.    Home Medications  Prior to Admission medications   Medication Sig Start Date End Date Taking? Authorizing Provider  ALPRAZolam Prudy Feeler) 0.5 MG tablet Take 0.5 mg by mouth 3 (three) times daily as needed for anxiety.    Yes Historical Provider, MD  calcipotriene-betamethasone (TACLONEX) ointment Apply 1 application topically 3 (three) times daily as needed (for psoriasis).    Yes Historical Provider, MD  omeprazole (PRILOSEC) 20 MG capsule Take 20 mg by mouth 2 (two) times daily.   Yes Historical Provider, MD  pindolol (VISKEN) 5 MG tablet Take 0.5 tablets (2.5 mg total) by mouth 2 (two) times daily. 06/26/13  Yes Duke Salvia, MD  Rivaroxaban (XARELTO) 20 MG TABS tablet Take 1 tablet (20 mg total) by mouth daily. 07/30/13  Yes Hillis Range, MD  temazepam (RESTORIL) 15 MG capsule Take 1 capsule (15 mg total) by mouth at bedtime as needed for sleep. 06/20/13  Yes Jerald Kief, MD   Family History  Family History  Problem Relation Age of Onset  . Hypertension Mother   . Heart attack Father     died @ 52.  Marland Kitchen Heart failure Father    Social History  History   Social History  . Marital Status: Married    Spouse Name: N/A    Number of Children: 2  . Years of Education: N/A   Occupational History  . communications    Social History Main Topics  . Smoking status: Former Smoker    Types: Cigarettes    Quit date: 12/24/2012  . Smokeless tobacco: Not on file  . Alcohol Use: No  . Drug Use: No  . Sexual Activity: Not on file   Other Topics Concern  . Not on file   Social History Narrative   Pt lives in [place] with spouse.  Owns a Nurse, adult.    Review of Systems General:  +++  wkns/fatigue/generalized malaise.  No chills, fever, night sweats or weight changes.  Cardiovascular:  +++ sharp/fleeting chest pain as outlined above.  +++ syncopal episode this AM.  +++ tachypalpitations over the weekend with HR's 180 to 240 - < .  No dyspnea on exertion, edema, orthopnea, palpitations, paroxysmal nocturnal dyspnea. Dermatological: No rash, lesions/masses Respiratory: No cough, dyspnea Urologic: No hematuria, dysuria Abdominal:   No nausea, vomiting, diarrhea, bright red blood per rectum, melena, or hematemesis Neurologic:  No visual changes, +++ wkns, changes in mental status. All other systems reviewed and are otherwise negative except as noted above.  Physical Exam  Blood pressure 108/67, pulse 60, temperature 98.2 F (36.8 C), temperature source Oral, resp. rate 22, SpO2 97.00%.  General: Pleasant, NAD Psych: Normal affect. Neuro: Alert and oriented  X 3. Moves all extremities spontaneously. HEENT: Normal  Neck: Supple without bruits or JVD. Lungs:  Resp regular and unlabored, CTA. Heart: irregular no s3, s4, or murmurs. Abdomen: Soft, non-tender, non-distended, BS + x 4.  Extremities: No clubbing, cyanosis or edema. DP/PT/Radials 2+ and equal bilaterally.  Labs  Troponin [facility] of Care Test)  Recent Labs  10/12/13 1235  TROPIPOC 0.00   No results found for this basename: CKTOTAL, CKMB, TROPONINI,  in the last 72 hours Lab Results  Component Value Date   WBC 7.5 10/12/2013   HGB 14.4 10/12/2013   HCT 40.5 10/12/2013   MCV 93.3 10/12/2013   PLT 155 10/12/2013     Recent Labs Lab 10/12/13 1206  NA 139  K 3.9  CL 106  CO2 27  BUN 14  CREATININE 0.79  CALCIUM 8.6  GLUCOSE 95   Lab Results  Component Value Date   CHOL 130 06/20/2013   HDL 28* 06/20/2013   LDLCALC 80 06/20/2013   TRIG 111 06/20/2013   Radiology/Studies  Dg Chest 2 View  10/12/2013   CLINICAL DATA:  Atrial fibrillation.  Syncope.  EXAM: CHEST  2 VIEW  COMPARISON:  06/19/2013   FINDINGS: Cardiac silhouette is normal in size. Aorta is minimally uncoiled. No mediastinal or hilar masses.  The lungs are clear.  No pleural effusion or pneumothorax.  The bony thorax is intact.  IMPRESSION: No active cardiopulmonary disease.   Electronically Signed   By: Amie Portland M.D.   On: 10/12/2013 13:48   ECG  Sinus rhtym, 68, ventricular bigeminy, deep twi II, III, aVF, V4-V6 - more pronounced than on prior ecg's.  ASSESSMENT AND PLAN  1.  Syncope:  Occurred after standing up from recliner this AM.  Was briefly preceded by lightheadedness and he also had a mild chest pain.  He does not remember falling and did not regain consciousness until he was already in the ambulance.  Suspect orthostasis may have played a role in initial episode.  Prolonged unresponsiveness may have been related to hitting his head on the floor.  No obvious trauma on exam.  Will check orthostatics and head CT.  He has been experiencing freq PVC's/PAC's (see below).  Follow on tele.  2.  Symptomatic PVC's/PAC's:  Pt presents with a 4 day h/o persistent fatigue with documented bigeminy.  He was seen in the office on Friday and advised to resume pindolol at 2.5mg  bid.  He has continued to feel poorly despite this.  He presented today after a syncopal episode at home and on the monitor here has exhibited ventricular bigeminy.  Rhythm strips from EMS also show intermittent atrial bigeminy.  Suspect syncope may have been secondary to orthostasis with recent resumption of bb therapy.  Check Mg.  Will hold bb, since it hasn't improved either his Ss or burden of PVC's.  Consider alternate medical therapy.  Will follow on tele and ask EP to see in AM.   3.  PAF:  S/p PVI.  Maintaining sinus with freq PVC's and occas PAC's at this time but reports brief HR recordings in the 180's to 240's.  ? Recurrent afib.  Follow on tele.  Cont xarelto.  Holding BB in setting of above for now.  Signed, Nicolasa Ducking, NP 10/12/2013,  3:06 PM

## 2013-10-12 NOTE — ED Notes (Signed)
Pt was at home with wife.  Pt attempted to stand up from recliner and had a syncopal episode.  Upon EMS arrival pt found to have HR of 30.  Pt c/o SOB and dizziness.  En route pt was SR with Multi Focal Bigeminal PVC's.  EMS administered 324 mg Aspirin and 3 SL Nitro.  Pt currently c/o headache and slight dizziness.  Pt with hx of AFib and had ablation done on 11/05.

## 2013-10-13 LAB — MAGNESIUM: Magnesium: 1.8 mg/dL (ref 1.5–2.5)

## 2013-10-13 LAB — TSH: TSH: 1.073 u[IU]/mL (ref 0.350–4.500)

## 2013-10-13 MED ORDER — SODIUM CHLORIDE 0.9 % IV SOLN
INTRAVENOUS | Status: DC
  Administered 2013-10-13: 10:00:00 via INTRAVENOUS

## 2013-10-13 NOTE — Progress Notes (Signed)
SUBJECTIVE: Some dizziness last night with bradycardia. Still with significant fatigue.  Denies chest pain or shortness of breath.  No further syncope.  CURRENT MEDICATIONS . pantoprazole  40 mg Oral Daily  . Rivaroxaban  20 mg Oral Daily      OBJECTIVE: Physical Exam: Filed Vitals:   10/12/13 1534 10/12/13 1716 10/12/13 2107 10/13/13 0540  BP: 124/95 107/64 103/47 100/56  Pulse: 74 78 72 56  Temp:  97.5 F (36.4 C) 97.8 F (36.6 C) 97.4 F (36.3 C)  TempSrc:  Oral Oral Oral  Resp: 20 18 18 18   Height:  6' (1.829 m)    Weight:  197 lb 8.5 oz (89.6 kg)  197 lb 5 oz (89.5 kg)  SpO2: 98% 97% 95% 98%   No intake or output data in the 24 hours ending 10/13/13 0756  Telemetry reveals sinus rhythm/sinus brady with frequent atrial ectopy  GEN- The patient is anxious appearing, alert and oriented x 3 today.   Head- normocephalic, atraumatic Eyes-  Sclera clear, conjunctiva pink Ears- hearing intact Oropharynx- clear with dry MM Neck- supple  Lungs- Clear to ausculation bilaterally, normal work of breathing Heart- bradycardic regular rhythm with frequent ectopy,  GI- Soft NT, ND, + BS Extremities- no clubbing, cyanosis, or edema Skin- no rash or lesion Psych- euthymic mood, full affect Neuro- strength and sensation are intact  LABS: Basic Metabolic Panel:  Recent Labs  16/10/96 1206 10/12/13 1843 10/13/13 0048  NA 139  --   --   K 3.9  --   --   CL 106  --   --   CO2 27  --   --   GLUCOSE 95  --   --   BUN 14  --   --   CREATININE 0.79  --   --   CALCIUM 8.6  --   --   MG  --  1.9 1.8   CBC:  Recent Labs  10/12/13 1206  WBC 7.5  HGB 14.4  HCT 40.5  MCV 93.3  PLT 155   Cardiac Enzymes:  Recent Labs  10/12/13 1843 10/13/13 0048 10/13/13 0500  TROPONINI <0.30 <0.30 <0.30   Thyroid Function Tests:  Recent Labs  10/12/13 1842  TSH 1.073   RADIOLOGY: Dg Chest 2 View 10/12/2013   CLINICAL DATA:  Atrial fibrillation.  Syncope.  EXAM: CHEST  2  VIEW  COMPARISON:  06/19/2013  FINDINGS: Cardiac silhouette is normal in size. Aorta is minimally uncoiled. No mediastinal or hilar masses.  The lungs are clear.  No pleural effusion or pneumothorax.  The bony thorax is intact.  IMPRESSION: No active cardiopulmonary disease.   Electronically Signed   By: Amie Portland M.D.   On: 10/12/2013 13:48   Ct Head Wo Contrast 10/12/2013   CLINICAL DATA:  Syncope, fatigue and chest pain before the event  EXAM: CT HEAD WITHOUT CONTRAST  TECHNIQUE: Contiguous axial images were obtained from the base of the skull through the vertex without intravenous contrast.  COMPARISON:  06/19/2013  FINDINGS: No acute hemorrhage, infarct, or mass lesion is identified. No midline shift. Ventricles are normal in size. Orbits and paranasal sinuses are unremarkable. No skull fracture. Minimal mucoperiosteal thickening is noted involving the ethmoid and left maxillary sinus. Soft tissue material within the bilateral external auditory canals is compatible with cerumen.  IMPRESSION: No acute intracranial findings.  Mild sinusitis.   Electronically Signed   By: Christiana Pellant M.D.   On: 10/12/2013 16:22  ASSESSMENT AND PLAN:  Active Problems:   Syncope  1. Bradycardia Sinus with frequent pacs and abbarant conduction Hold pindolol Evaluate heart rate with ambulation  2. afib Maintaining sinus rhythm Continue xarelto  3. Dizziness Unclear etiology Likely due to dehydration Will encourage PO hydration PT to assess ambulation  I hope to avoid PPM in this young patient

## 2013-10-13 NOTE — Progress Notes (Signed)
Paged Tresa Endo, MD to advise of pt having nonsustained bradycardia in the 30's-40's. Pt complains of dizziness but otherwise asymptomatic. Pt advised to stay in bed, no other orders given, will continue to monitor pt.   Ulice Dash, Charity fundraiser

## 2013-10-13 NOTE — Progress Notes (Signed)
Utilization review completed.  

## 2013-10-13 NOTE — Evaluation (Signed)
Physical Therapy Evaluation Patient Details Name: Wayne Pollard MRN: [id number] DOB: 20-Jun-1966 Today's Date: 10/13/2013 Time: 1050-1103 PT Time Calculation (min): 13 min  PT Assessment / Plan / Recommendation History of Present Illness  47 year old male with history of paroxysmal atrial fibrillation status post pulmonary vein isolation November 2014 who presented to the ED this morning with progressive fatigue followed by syncopal episode.  Clinical Impression  Pt is independent with mobility and has no further PT needs at this time.  HR 42-43bpm throughout gait training.  May benefit from cardiac rehab at d/c.    PT Assessment  Patent does not need any further PT services    Follow Up Recommendations  No PT follow up    Does the patient have the potential to tolerate intense rehabilitation      Barriers to Discharge        Equipment Recommendations  None recommended by PT    Recommendations for Other Services     Frequency      Precautions / Restrictions Restrictions Weight Bearing Restrictions: No   Pertinent Vitals/Pain HR 42-43 bpm during gait training. No c/o pain or dizziness      Mobility  Bed Mobility Bed Mobility: Sit to Supine;Supine to Sit Supine to Sit: 7: Independent Sit to Supine: 7: Independent Transfers Transfers: Sit to Stand;Stand to Sit Sit to Stand: 7: Independent Stand to Sit: 7: Independent Ambulation/Gait Ambulation/Gait Assistance: 7: Independent Ambulation Distance (Feet): 200 Feet Assistive device: None Ambulation/Gait Assistance Details: pt HR 42-43 bpm during gait, no c/o dizziness    Exercises     PT Diagnosis:    PT Problem List:   PT Treatment Interventions:       PT Goals(Current goals can be found in the care plan section)    Visit Information  Last PT Received On: 10/13/13 Assistance Needed: +1 History of Present Illness: 47 year old male with history of paroxysmal atrial fibrillation status post pulmonary vein  isolation November 2014 who presented to the ED this morning with progressive fatigue followed by syncopal episode.       Prior Functioning  Home Living Family/patient expects to be discharged to:: Private residence Living Arrangements: Spouse/significant other;Children Available Help at Discharge: Family Type of Home: House Home Access: Stairs to enter Secretary/administrator of Steps: 3 Entrance Stairs-Rails: Right Home Layout: One level Home Equipment: None Prior Function Level of Independence: Independent Communication Communication: No difficulties    Cognition  Cognition Arousal/Alertness: Awake/alert Behavior During Therapy: WFL for tasks assessed/performed Overall Cognitive Status: Within Functional Limits for tasks assessed    Extremity/Trunk Assessment Lower Extremity Assessment Lower Extremity Assessment: Overall WFL for tasks assessed Cervical / Trunk Assessment Cervical / Trunk Assessment: Normal   Balance    End of Session PT - End of Session Activity Tolerance: Patient tolerated treatment well Patient left: in bed;with call bell/phone within reach Nurse Communication: Mobility status  GP     DONAWERTH,KAREN 10/13/2013, 11:30 AM

## 2013-10-14 ENCOUNTER — Encounter (HOSPITAL_COMMUNITY): Payer: Self-pay | Admitting: Physician Assistant

## 2013-10-14 NOTE — Discharge Summary (Signed)
Discharge Summary   Patient ID: ROTH RESS MRN: [id number], DOB/AGE: 47-Apr-1967 47 y.o. Admit date: 10/12/2013 D/C date:     10/14/2013  Primary Care Provider: Raquel Sarna, MD Primary Cardiologist: P. Swaziland, MD / Shela Commons. Oumar Marcott, MD   Primary Discharge Diagnoses:  1. Syncope - possible orthostatic event 2. Bradycardia - sinus with frequent PACs and abberrant conduction - beta blocker discontinued 3. PAF, maintaining NSR this admission - subjective outpatient palpitations - previously failed flecainide/dilt/lopressor - 08/2013 TEE: EF 55-60%, no rwma, no LAA thrombus or veg - s/p PVI & afib ablation 08/2013, on chronic Xarelto  Secondary Discharge Diagnoses:  1. Anxiety 2. Chest pain 04/2010: essentially nl cors, nl EF.   Hospital Course: Mr. Wayne Pollard is a 47 y/o M with history of PAF s/p PVI 08/2013 & essential normal cors 2011 who presented to [facility] 10/12/2013 with progressive fatigue and a syncopal episode. He was previously on flecainide, diltiazem, and metoprolol. Despite drug therapy, he had continued to have paroxysms of atrial fib. In November 2014, he underwent transesophageal echocardiogram which showed no evidence of left atrial appendage thrombus. He subsequently underwent pulmonary vein isolation/afib ablation, which was successful. Following discharge, flecainide was discontinued. He thought he was supposed to discontinue his pindolol as well and so he did. He felt great following his ablation and did not have any recurrent paroxysms of afib at all. On Christmas morning however, he began to experience significant fatigue. He owns a portable pulse-ox and noted HR's in the 30's throughout the day on Christmas. He continued to feel weak and washed out and called in to the office on December 26. He came into the office that day and an ECG was performed. There is no note regarding that visit an ECG was not available for review however patient stated that he was told that his  home monitor was only taking every other beat because he was having extra beats. We presume this to mean that he was having ventricular bigeminy. He was told to resume pindolol at 2.5 mg twice a day and so he did. Unfortunately, he continued to feel weak and washed out without chest pain or dyspnea. He continued to monitor HR at home - for the most part had noted rates in the 30's but at times had rates in the 1-teens. The day prior to admission, in the setting of tachycardia and palpitations, he had a rate in the 180s lasting just a few seconds and resolving spontaneously.  On 10/12/13, he continued to feel washed out. While coming inside after getting the newspaper, he had an episode of sharp chest pain which lasted just a few seconds and resolved spontaneously. This occurred again while waiting for his mother-in-law at a dialysis center. When he returned home, he again noted tachycardia and palpitations and his heart monitor showed a heart rate of 240. This again resolved spontaneously within a minute or so. Later, he was sitting in his recliner and decided to get up and take something to his church. When he sat up in a recliner he felt a little bit lightheaded and then after standing, without warning, he lost consciousness and fell to the floor. His fall was not witnessed but his wife did hear him fall. Upon finding him, he was unresponsive and lying on his back. His breathing was somewhat shallow. EMS was called and patient was found to have double atrial bigeminy and ventricular bigeminy with rates in the 60s. He finally regained consciousness while  on the ambulance on the way to [facility]. In the ER, he continued to have ventricular bigeminy with rates in the 60s. Upon evaluation by cardiology he reported weakness and fatigue but no CP or dyspnea. He was not tachycardic, tachypnic or hypoxic.  CT head was nonacute. Orthostatic VS were negative in the ER. Labwork was essentially normal - BMET, CBC,  troponins x 4 unremarkable and TSH WNL. It was suspected that his syncope was due to outpatient orthostasis in the setting of recent resumption of beta blocker therapy. Given that beta blocker had not helped his ectopy, it was discontinued (also because of the bradycardia). He was admitted for further observation. CXR nonacute. He was not tachycardic, tachypnic or hypoxic. He was followed on telemetry and was noted to have some bradycardia with frequent PACs and abberrant conduction. He walked with PT with HR in the 40's and did not have any pain or dizziness. Throughout the remainder of the day and night, HR was mostly in the 60's. Dr. Johney Frame recommended to continue to hold his pindolol. Though he has a component of sinus node dysfunction, Dr. Johney Frame was not certain that this was responsible for all of his symptoms. Given his young age Dr. Johney Frame wishes to avoid pacemaker if possible and plans for close outpatient followup. He would like to hold off event monitor for now. The patient is not to drive or return to work until seen in the office again for followup. If symptoms recur, then he was advised to return. Xarelto was continued. PO hydration was recommended. Dr. Johney Frame has seen and examined the patient today and feels he is stable for discharge.  Discharge Vitals: Blood pressure 107/51, pulse 67, temperature 97.9 F (36.6 C), temperature source Oral, resp. rate 18, height 6' (1.829 m), weight 198 lb 1.6 oz (89.858 kg), SpO2 98.00%.  Labs: Lab Results  Component Value Date   WBC 7.5 10/12/2013   HGB 14.4 10/12/2013   HCT 40.5 10/12/2013   MCV 93.3 10/12/2013   PLT 155 10/12/2013     Recent Labs Lab 10/12/13 1206  NA 139  K 3.9  CL 106  CO2 27  BUN 14  CREATININE 0.79  CALCIUM 8.6  GLUCOSE 95    Recent Labs  10/12/13 1843 10/13/13 0048 10/13/13 0500  TROPONINI <0.30 <0.30 <0.30   Lab Results  Component Value Date   CHOL 130 06/20/2013   HDL 28* 06/20/2013   LDLCALC 80 06/20/2013     TRIG 111 06/20/2013    Diagnostic Studies/Procedures   Dg Chest 2 View 10/12/2013   CLINICAL DATA:  Atrial fibrillation.  Syncope.  EXAM: CHEST  2 VIEW  COMPARISON:  06/19/2013  FINDINGS: Cardiac silhouette is normal in size. Aorta is minimally uncoiled. No mediastinal or hilar masses.  The lungs are clear.  No pleural effusion or pneumothorax.  The bony thorax is intact.  IMPRESSION: No active cardiopulmonary disease.   Electronically Signed   By: Amie Portland M.D.   On: 10/12/2013 13:48   Ct Head Wo Contrast 10/12/2013   CLINICAL DATA:  Syncope, fatigue and chest pain before the event  EXAM: CT HEAD WITHOUT CONTRAST  TECHNIQUE: Contiguous axial images were obtained from the base of the skull through the vertex without intravenous contrast.  COMPARISON:  06/19/2013  FINDINGS: No acute hemorrhage, infarct, or mass lesion is identified. No midline shift. Ventricles are normal in size. Orbits and paranasal sinuses are unremarkable. No skull fracture. Minimal mucoperiosteal thickening is noted involving the ethmoid  and left maxillary sinus. Soft tissue material within the bilateral external auditory canals is compatible with cerumen.  IMPRESSION: No acute intracranial findings.  Mild sinusitis.   Electronically Signed   By: Christiana Pellant M.D.   On: 10/12/2013 16:22    Discharge Medications     Medication List    STOP taking these medications       pindolol 5 MG tablet  Commonly known as:  VISKEN      TAKE these medications       ALPRAZolam 0.5 MG tablet  Commonly known as:  XANAX  Take 0.5 mg by mouth 3 (three) times daily as needed for anxiety.     calcipotriene-betamethasone ointment  Commonly known as:  TACLONEX  Apply 1 application topically 3 (three) times daily as needed (for psoriasis).     omeprazole 20 MG capsule  Commonly known as:  PRILOSEC  Take 20 mg by mouth 2 (two) times daily.     Rivaroxaban 20 MG Tabs tablet  Commonly known as:  XARELTO  Take 1 tablet (20 mg  total) by mouth daily.     temazepam 15 MG capsule  Commonly known as:  RESTORIL  Take 1 capsule (15 mg total) by mouth at bedtime as needed for sleep.        Disposition   The patient will be discharged in stable condition to home. Discharge Orders   Future Appointments Provider Department Dept Phone   10/29/2013 12:00 PM Hillis Range, MD [facility] [place] Office [phone]   11/19/2013 10:00 AM Hillis Range, MD [facility] [phone]   Future Orders Complete By Expires   Diet general  As directed    Comments:     Heart Healthy. Dr. Johney Frame encourages you to stay well-hydrated.   Increase activity slowly  As directed    Comments:     Dr. Johney Frame does not want you to drive or return to work until you are seen back in the office.  If symptoms happen again, seek medical attention.     Follow-up Information   Follow up with Hillis Range, MD. (10/29/13 at 12pm)    Specialty:  Cardiology   Contact information:   [address] [place] [address] [phone]         Duration of Discharge Encounter: Greater than 30 minutes including physician and PA time.  Thomasene Mohair PA-C 10/14/2013, 9:46 AM  Hillis Range MD

## 2013-10-14 NOTE — Progress Notes (Signed)
SUBJECTIVE: No chest pain, shortness of breath, or further syncope.  Walked with PT yesterday - rates in the 40's. Heart rates throughout most of afternoon yesterday and last night in the 60's, pt states he had no significant improvement in symptoms.   CURRENT MEDICATIONS . pantoprazole  40 mg Oral Daily  . Rivaroxaban  20 mg Oral Daily   . sodium chloride 150 mL/hr at 10/13/13 0933    OBJECTIVE: Physical Exam: Filed Vitals:   10/13/13 1128 10/13/13 1352 10/13/13 2107 10/14/13 0500  BP:  109/65 125/69 107/51  Pulse: 43 45 77 67  Temp:  97.6 F (36.4 C) 98.6 F (37 C) 97.9 F (36.6 C)  TempSrc:  Oral Oral Oral  Resp:  18 18 18   Height:      Weight:    198 lb 1.6 oz (89.858 kg)  SpO2:   95% 98%   No intake or output data in the 24 hours ending 10/14/13 8413  Telemetry reveals sinus rhythm/sinus brady with frequent atrial ectopy - rates improved  GEN- The patient is anxious appearing, alert and oriented x 3 today.   Head- normocephalic, atraumatic Eyes-  Sclera clear, conjunctiva pink Ears- hearing intact Oropharynx- clear with dry MM Neck- supple  Lungs- Clear to ausculation bilaterally, normal work of breathing Heart- bradycardic regular rhythm with frequent ectopy,  GI- Soft NT, ND, + BS Extremities- no clubbing, cyanosis, or edema Skin- no rash or lesion Psych- euthymic mood, full affect Neuro- strength and sensation are intact  LABS: Basic Metabolic Panel:  Recent Labs  24/40/10 1206 10/12/13 1843 10/13/13 0048  NA 139  --   --   K 3.9  --   --   CL 106  --   --   CO2 27  --   --   GLUCOSE 95  --   --   BUN 14  --   --   CREATININE 0.79  --   --   CALCIUM 8.6  --   --   MG  --  1.9 1.8   CBC:  Recent Labs  10/12/13 1206  WBC 7.5  HGB 14.4  HCT 40.5  MCV 93.3  PLT 155   Cardiac Enzymes:  Recent Labs  10/12/13 1843 10/13/13 0048 10/13/13 0500  TROPONINI <0.30 <0.30 <0.30   Thyroid Function Tests:  Recent Labs  10/12/13 1842    TSH 1.073   RADIOLOGY: Dg Chest 2 View 10/12/2013   CLINICAL DATA:  Atrial fibrillation.  Syncope.  EXAM: CHEST  2 VIEW  COMPARISON:  06/19/2013  FINDINGS: Cardiac silhouette is normal in size. Aorta is minimally uncoiled. No mediastinal or hilar masses.  The lungs are clear.  No pleural effusion or pneumothorax.  The bony thorax is intact.  IMPRESSION: No active cardiopulmonary disease.   Electronically Signed   By: Amie Portland M.D.   On: 10/12/2013 13:48   Ct Head Wo Contrast 10/12/2013   CLINICAL DATA:  Syncope, fatigue and chest pain before the event  EXAM: CT HEAD WITHOUT CONTRAST  TECHNIQUE: Contiguous axial images were obtained from the base of the skull through the vertex without intravenous contrast.  COMPARISON:  06/19/2013  FINDINGS: No acute hemorrhage, infarct, or mass lesion is identified. No midline shift. Ventricles are normal in size. Orbits and paranasal sinuses are unremarkable. No skull fracture. Minimal mucoperiosteal thickening is noted involving the ethmoid and left maxillary sinus. Soft tissue material within the bilateral external auditory canals is compatible with cerumen.  IMPRESSION: No  acute intracranial findings.  Mild sinusitis.   Electronically Signed   By: Christiana Pellant M.D.   On: 10/12/2013 16:22    ASSESSMENT AND PLAN:  Active Problems:   Syncope  1. Bradycardia Sinus with frequent pacs and abbarant conduction Hold pindolol Though he has a component of sinus node dysfunction, I am not certain that his is responsible for all of his symptoms.  As he is young and would like to avoid PPM if possible, we will discharge and follow closely as an outpatient.  I have encouraged him to not drive until I see him again in 2 weeks.  If his symptoms worsen then he should return.  2. afib Maintaining sinus rhythm Continue xarelto  3. Dizziness As above Encouraged PO hydration  DC to home today Follow-up with me in 2 weeks

## 2013-10-15 NOTE — ED Provider Notes (Signed)
Medical screening examination/treatment/procedure(s) were performed by non-physician practitioner and as supervising physician I was immediately available for consultation/collaboration.  EKG Interpretation    Date/Time:  Sunday October 12 2013 11:33:44 EST Ventricular Rate:  82 PR Interval:  146 QRS Duration: 80 QT Interval:  284 QTC Calculation: 332 R Axis:   48 Text Interpretation:  Sinus rhythm Ventricular bigeminy Probable left atrial enlargement Anteroseptal infarct, old Abnormal T, probable ischemia, inferior leads ED PHYSICIAN INTERPRETATION AVAILABLE IN [facility] Confirmed by TEST, RECORD ([id number]) on 10/14/2013 11:27:53 AM             Geoffery Lyons, MD 10/15/13 [phone]

## 2013-10-17 NOTE — Telephone Encounter (Addendum)
Patient was accompanied by his wife today and came in with c/o being out of rhythm and dizziness.  I spoke with him via phone and asked him to come in for a EKG.  Patient came in and his EKG showed NSR with  PVC's(bigeminy) BP stable at 124/76,  Dr Jens Somrenshaw reviewed and suggested the patient restart his Pindolol 2.25mg  twice daily to help with PVC burden.    He has not been taken this since discharge.  He will do so and will keep his follow up as scheduled with Dr Johney FrameAllred 10/29/13.  He will call back with further problems or concerns

## 2013-10-29 ENCOUNTER — Encounter: Payer: Self-pay | Admitting: Internal Medicine

## 2013-10-29 ENCOUNTER — Ambulatory Visit (INDEPENDENT_AMBULATORY_CARE_PROVIDER_SITE_OTHER): Payer: BC Managed Care – PPO | Admitting: Internal Medicine

## 2013-10-29 VITALS — BP 128/84 | HR 63 | Ht 72.0 in | Wt 196.0 lb

## 2013-10-29 DIAGNOSIS — R001 Bradycardia, unspecified: Secondary | ICD-10-CM

## 2013-10-29 DIAGNOSIS — I498 Other specified cardiac arrhythmias: Secondary | ICD-10-CM

## 2013-10-29 DIAGNOSIS — I480 Paroxysmal atrial fibrillation: Secondary | ICD-10-CM

## 2013-10-29 DIAGNOSIS — I4891 Unspecified atrial fibrillation: Secondary | ICD-10-CM

## 2013-10-29 NOTE — Progress Notes (Signed)
PCP: Raquel Sarnaonald W Lee, MD Primary Cardiologist:   Dr SwazilandJordan  Wayne Pollard is a 48 y.o. male who presents today for routine electrophysiology followup.  Since his recent hospital discharge, the patient reports doing very well.  His bradycardia has resolved.  His dizziness is improved and he has returned to work.  Today, he denies symptoms of palpitations, chest pain, shortness of breath,  lower extremity edema, presyncope, or syncope.  The patient is otherwise without complaint today.   Past Medical History  Diagnosis Date  . Anxiety   . Paroxysmal atrial fibrillation     a. previously failed flecainide/dilt/lopressor;  b. 08/2013 TEE: EF 55-60%, no rwma, no LAA thrombus or veg;  c. s/p PVI and afib ablation 08/2013;  d. chronic xarelto.  . Chest pain     a. 04/2010 Cath: essentially nl cors, nl EF.  Marland Kitchen. Syncope     a. 09/2013 adm - not clear if related to bradycardia. Suspected orthostasis.  . Bradycardia     a. 09/2013 adm - pindolol discontinued.   Past Surgical History  Procedure Laterality Date  . Appendectomy    . Cholecystectomy    . Hernia repair    . Cardiac catheterization      [facility], Pt reports no CAD  . Tee without cardioversion N/A 08/18/2013    Procedure: TRANSESOPHAGEAL ECHOCARDIOGRAM (TEE);  Surgeon: Lewayne BuntingBrian S Crenshaw, MD;  [facility];  Service: Cardiovascular;  Laterality: N/A;  . Ablation  08/19/2013    PVI by Dr Johney FrameAllred    Current Outpatient Prescriptions  Medication Sig Dispense Refill  . ALPRAZolam (XANAX) 0.5 MG tablet Take 0.5 mg by mouth 3 (three) times daily as needed for anxiety.       . calcipotriene-betamethasone (TACLONEX) ointment Apply 1 application topically 3 (three) times daily as needed (for psoriasis).       Marland Kitchen. omeprazole (PRILOSEC) 20 MG capsule Take 20 mg by mouth 2 (two) times daily.      . Rivaroxaban (XARELTO) 20 MG TABS tablet Take 1 tablet (20 mg total) by mouth daily.  30 tablet  11  . temazepam (RESTORIL) 15 MG capsule  Take 1 capsule (15 mg total) by mouth at bedtime as needed for sleep.  14 capsule  0   No current facility-administered medications for this visit.    Physical Exam: Filed Vitals:   10/29/13 1141  BP: 128/84  Pulse: 63  Height: 6' (1.829 m)  Weight: 196 lb (88.905 kg)    GEN- The patient is well appearing, alert and oriented x 3 today.   Head- normocephalic, atraumatic Eyes-  Sclera clear, conjunctiva pink Ears- hearing intact Oropharynx- clear Lungs- Clear to ausculation bilaterally, normal work of breathing Heart- Regular rate and rhythm, no murmurs, rubs or gallops, PMI not laterally displaced GI- soft, NT, ND, + BS Extremities- no clubbing, cyanosis, or edema  ekg today reveals sinus rhythm 62 bpm with rare pvcs  Assessment and Plan:  1. Bradycardia Resolved  2. afib Stable No change required today  Return in 2 months Given chads2vasc score of 0, we will plan to stop xarelto at that time.

## 2013-10-29 NOTE — Patient Instructions (Signed)
Your physician recommends that you schedule a follow-up appointment in: 6 weeks with Dr Allred  

## 2013-11-19 ENCOUNTER — Encounter: Payer: BC Managed Care – PPO | Admitting: Internal Medicine

## 2013-12-10 ENCOUNTER — Ambulatory Visit: Payer: BC Managed Care – PPO | Admitting: Internal Medicine

## 2013-12-15 ENCOUNTER — Encounter: Payer: Self-pay | Admitting: Internal Medicine

## 2013-12-16 ENCOUNTER — Encounter: Payer: Self-pay | Admitting: Internal Medicine

## 2013-12-16 ENCOUNTER — Telehealth: Payer: Self-pay | Admitting: Internal Medicine

## 2014-07-13 ENCOUNTER — Encounter: Payer: Self-pay | Admitting: Internal Medicine

## 2014-07-31 ENCOUNTER — Telehealth: Payer: Self-pay | Admitting: Internal Medicine

## 2014-07-31 NOTE — Telephone Encounter (Signed)
I spoke with the patient today as he has not returned as scheduled. He states that his dizziness completely resolved.  He has had no symptoms of afib since his ablation. He says that he is working 7 days a week and traveling across the [place] and hasnt had time to come to the office but is very pleased with the results of his ablation.

## 2014-09-21 ENCOUNTER — Telehealth: Payer: Self-pay | Admitting: Internal Medicine

## 2014-09-21 NOTE — Telephone Encounter (Signed)
error 

## 2014-09-24 ENCOUNTER — Encounter (HOSPITAL_COMMUNITY): Payer: Self-pay | Admitting: Internal Medicine

## 2014-11-06 ENCOUNTER — Ambulatory Visit: Payer: BC Managed Care – PPO | Admitting: Internal Medicine

## 2014-11-18 ENCOUNTER — Ambulatory Visit: Payer: Self-pay | Admitting: Internal Medicine

## 2014-11-25 ENCOUNTER — Ambulatory Visit: Payer: Self-pay | Admitting: Internal Medicine

## 2014-12-14 ENCOUNTER — Encounter: Payer: Self-pay | Admitting: Internal Medicine

## 2014-12-14 ENCOUNTER — Ambulatory Visit (INDEPENDENT_AMBULATORY_CARE_PROVIDER_SITE_OTHER): Payer: BLUE CROSS/BLUE SHIELD | Admitting: Internal Medicine

## 2014-12-14 VITALS — BP 124/82 | HR 68 | Ht 72.0 in | Wt 205.8 lb

## 2014-12-14 DIAGNOSIS — I480 Paroxysmal atrial fibrillation: Secondary | ICD-10-CM

## 2014-12-14 DIAGNOSIS — I481 Persistent atrial fibrillation: Secondary | ICD-10-CM

## 2014-12-14 DIAGNOSIS — F17200 Nicotine dependence, unspecified, uncomplicated: Secondary | ICD-10-CM

## 2014-12-14 DIAGNOSIS — I4819 Other persistent atrial fibrillation: Secondary | ICD-10-CM

## 2014-12-14 DIAGNOSIS — I4891 Unspecified atrial fibrillation: Secondary | ICD-10-CM

## 2014-12-14 DIAGNOSIS — Z720 Tobacco use: Secondary | ICD-10-CM

## 2014-12-14 NOTE — Patient Instructions (Signed)
Your physician wants you to follow-up in: 12 months with Dr Allred You will receive a reminder letter in the mail two months in advance. If you don't receive a letter, please call our office to schedule the follow-up appointment.  

## 2014-12-14 NOTE — Progress Notes (Signed)
PCP: Raquel Sarnaonald W Lee, MD Primary Cardiologist:   Wayne Pollard  Wayne Pollard is a 49 y.o. male who presents today for routine electrophysiology followup.  Since his ablation in 2014 and subsequent hospitalization for bradycardia , the patient reports doing very well.  His bradycardia  resolved. He has noticed any further afib. 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 He continues to smoke.  Today, he denies symptoms of palpitations, chest pain, shortness of breath,  lower extremity edema, presyncope, or syncope.  The patient is otherwise without complaint today.   Past Medical History  Diagnosis Date  . Anxiety   . Paroxysmal atrial fibrillation     a. previously failed flecainide/dilt/lopressor;  b. 08/2013 TEE: EF 55-60%, no rwma, no LAA thrombus or veg;  c. s/p PVI and afib ablation 08/2013;  d. chronic xarelto.  . Chest pain     a. 04/2010 Cath: essentially nl cors, nl EF.  Marland Kitchen. Syncope     a. 09/2013 adm - not clear if related to bradycardia. Suspected orthostasis.  . Bradycardia     a. 09/2013 adm - pindolol discontinued.   Past Surgical History  Procedure Laterality Date  . Appendectomy    . Cholecystectomy    . Hernia repair    . Cardiac catheterization      [facility], Pt reports no CAD  . Tee without cardioversion N/A 08/18/2013    Procedure: TRANSESOPHAGEAL ECHOCARDIOGRAM (TEE);  Surgeon: Wayne BuntingBrian S Crenshaw, MD;  [facility];  Service: Cardiovascular;  Laterality: N/A;  . Ablation  08/19/2013    PVI by Wayne Johney FrameAllred  . Atrial fibrillation ablation N/A 08/19/2013    Procedure: ATRIAL FIBRILLATION ABLATION;  Surgeon: Gardiner RhymeJames D Nehal Witting, MD;  Location: [facility] CATH LAB;  Service: Cardiovascular;  Laterality: N/A;    Current Outpatient Prescriptions  Medication Sig Dispense Refill  . calcipotriene-betamethasone (TACLONEX) ointment Apply 1 application topically 3 (three) times daily as needed (for psoriasis).     Marland Kitchen.  omeprazole (PRILOSEC) 20 MG capsule Take 20 mg by mouth 2 (two) times daily. Pt states he does not take this everyday (12/14/14)    . ALPRAZolam (XANAX) 0.5 MG tablet Take 0.5 mg by mouth 3 (three) times daily as needed for anxiety.      No current facility-administered medications for this visit.    Physical Exam: Filed Vitals:   12/14/14 1546  BP: 124/82  Pulse: 68  Height: 6' (1.829 m)  Weight: 205 lb 12.8 oz (93.35 kg)    GEN- The patient is well appearing, alert and oriented x 3 today.   Head- normocephalic, atraumatic Eyes-  Sclera clear, conjunctiva pink Ears- hearing intact Oropharynx- clear Lungs- Clear to ausculation bilaterally, normal work of breathing Heart- Regular rate and rhythm, no murmurs, rubs or gallops, PMI not laterally displaced GI- soft, NT, ND, + BS Extremities- no clubbing, cyanosis, or edema  ekg today reveals sinus rhythm   Assessment and Plan:  1. Bradycardia Resolved  2. H/o afib s/p ablation Maintaining SR off of AAD Stable No change required today Off Noac.-chadsvasc score of 0  3. tobaccco Cessation advised  F/u in one year.

## 2014-12-14 NOTE — Progress Notes (Deleted)
PCP: Raquel Sarnaonald W Lee, MD Primary Cardiologist:   Dr SwazilandJordan  Wayne Pollard is a 49 y.o. male who presents today for routine electrophysiology followup.  Since his ablation in 2014 and subsequent hospitalization for bradycardia , the patient reports doing very well.  His bradycardia  resolved. He has noticed any further afib. 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 He continues to smoke.  Today, he denies symptoms of palpitations, chest pain, shortness of breath,  lower extremity edema, presyncope, or syncope.  The patient is otherwise without complaint today.   Past Medical History  Diagnosis Date  . Anxiety   . Paroxysmal atrial fibrillation     a. previously failed flecainide/dilt/lopressor;  b. 08/2013 TEE: EF 55-60%, no rwma, no LAA thrombus or veg;  c. s/p PVI and afib ablation 08/2013;  d. chronic xarelto.  . Chest pain     a. 04/2010 Cath: essentially nl cors, nl EF.  Marland Kitchen. Syncope     a. 09/2013 adm - not clear if related to bradycardia. Suspected orthostasis.  . Bradycardia     a. 09/2013 adm - pindolol discontinued.   Past Surgical History  Procedure Laterality Date  . Appendectomy    . Cholecystectomy    . Hernia repair    . Cardiac catheterization      [facility], Pt reports no CAD  . Tee without cardioversion N/A 08/18/2013    Procedure: TRANSESOPHAGEAL ECHOCARDIOGRAM (TEE);  Surgeon: Lewayne BuntingBrian S Crenshaw, MD;  Location: [facility] ENDOSCOPY;  Service: Cardiovascular;  Laterality: N/A;  . Ablation  08/19/2013    PVI by Dr Johney FrameAllred  . Atrial fibrillation ablation N/A 08/19/2013    Procedure: ATRIAL FIBRILLATION ABLATION;  Surgeon: Gardiner RhymeJames D Allred, MD;  Location: [facility] CATH LAB;  Service: Cardiovascular;  Laterality: N/A;    Current Outpatient Prescriptions  Medication Sig Dispense Refill  . calcipotriene-betamethasone (TACLONEX) ointment Apply 1 application topically 3 (three) times daily as needed (for psoriasis).     Marland Kitchen. omeprazole  (PRILOSEC) 20 MG capsule Take 20 mg by mouth 2 (two) times daily. Pt states he does not take this everyday (12/14/14)    . ALPRAZolam (XANAX) 0.5 MG tablet Take 0.5 mg by mouth 3 (three) times daily as needed for anxiety.      No current facility-administered medications for this visit.    Physical Exam: Filed Vitals:   12/14/14 1546  BP: 124/82  Pulse: 68  Height: 6' (1.829 m)  Weight: 205 lb 12.8 oz (93.35 kg)    GEN- The patient is well appearing, alert and oriented x 3 today.   Head- normocephalic, atraumatic Eyes-  Sclera clear, conjunctiva pink Ears- hearing intact Oropharynx- clear Lungs- Clear to ausculation bilaterally, normal work of breathing Heart- Regular rate and rhythm, no murmurs, rubs or gallops, PMI not laterally displaced GI- soft, NT, ND, + BS Extremities- no clubbing, cyanosis, or edema  ekg today reveals sinus rhythm 68 bpm with  t wave abnormality, unchanged from previous.  Assessment and Plan:  1. Bradycardia Resolved  2. H/o afib s/p ablation Maintaining SR  Stable No change required today Off Noac.-chadsvasc score of 0  F/u in one year.

## 2015-05-11 ENCOUNTER — Telehealth: Payer: Self-pay | Admitting: Internal Medicine

## 2015-05-11 NOTE — Telephone Encounter (Signed)
New Message  Pt called in asking for next avail w/ Dr. Johney Frame. Pt c/o similar AF symptoms from last bout of palp. Pt was given appt w/ Allred: 8/31 @ 11:45. Pt requested to speak w/ RN about what to do going forward. Please call back and discuss.

## 2015-05-11 NOTE — Telephone Encounter (Signed)
Pt called in asking for next avail w/ Dr. Johney Frame. Pt c/o similar AF symptoms from last bout of palp. Pt was given appt w/ Allred: 8/31 @ 11:45. Pt requested to speak w/ RN about what to do going forward. Please call back and discuss. (per message)  Had steroid injection in back one and a half weeks ago.  Has had episodes since then where he has hard irregular beats but not sure if it is always irregular when it happens. Average occurences maybe twice a day.  Gets some pressure.  Discussed with Tereso Newcomer APP PA-C, has appointment with FLEX on Thursday at 2pm  Understands to call if he gets worse or go to the ED

## 2015-05-12 NOTE — Telephone Encounter (Signed)
Would prefer pt to be evaluated in the AF clinic this week.

## 2015-05-13 ENCOUNTER — Ambulatory Visit (HOSPITAL_COMMUNITY)
Admission: RE | Admit: 2015-05-13 | Discharge: 2015-05-13 | Disposition: A | Discharge status: Home / Self Care | Payer: BLUE CROSS/BLUE SHIELD | Source: Ambulatory Visit | Attending: Nurse Practitioner | Admitting: Nurse Practitioner

## 2015-05-13 ENCOUNTER — Telehealth: Payer: Self-pay

## 2015-05-13 ENCOUNTER — Encounter: Payer: BLUE CROSS/BLUE SHIELD | Admitting: Physician Assistant

## 2015-05-13 ENCOUNTER — Encounter (HOSPITAL_COMMUNITY): Payer: Self-pay | Admitting: Nurse Practitioner

## 2015-05-13 VITALS — BP 146/84 | HR 74 | Ht 72.0 in | Wt 193.6 lb

## 2015-05-13 DIAGNOSIS — F419 Anxiety disorder, unspecified: Secondary | ICD-10-CM | POA: Insufficient documentation

## 2015-05-13 DIAGNOSIS — R6884 Jaw pain: Secondary | ICD-10-CM | POA: Diagnosis not present

## 2015-05-13 DIAGNOSIS — Z8249 Family history of ischemic heart disease and other diseases of the circulatory system: Secondary | ICD-10-CM | POA: Diagnosis not present

## 2015-05-13 DIAGNOSIS — R079 Chest pain, unspecified: Secondary | ICD-10-CM | POA: Diagnosis not present

## 2015-05-13 DIAGNOSIS — R9431 Abnormal electrocardiogram [ECG] [EKG]: Secondary | ICD-10-CM | POA: Diagnosis not present

## 2015-05-13 DIAGNOSIS — Z79899 Other long term (current) drug therapy: Secondary | ICD-10-CM | POA: Diagnosis not present

## 2015-05-13 DIAGNOSIS — R0789 Other chest pain: Secondary | ICD-10-CM

## 2015-05-13 DIAGNOSIS — F17210 Nicotine dependence, cigarettes, uncomplicated: Secondary | ICD-10-CM | POA: Diagnosis not present

## 2015-05-13 DIAGNOSIS — Z7982 Long term (current) use of aspirin: Secondary | ICD-10-CM | POA: Diagnosis not present

## 2015-05-13 DIAGNOSIS — R000 Tachycardia, unspecified: Secondary | ICD-10-CM | POA: Insufficient documentation

## 2015-05-13 MED ORDER — ASPIRIN EC 81 MG PO TBEC: 81.0000 mg | DELAYED_RELEASE_TABLET | Freq: Every day | ORAL | Status: DC

## 2015-05-13 NOTE — Progress Notes (Deleted)
Cardiology Office Note   Date:  05/13/2015   ID:  Wayne Pollard, DOB 1965-11-27, MRN [id number]  PCP:  Raquel Sarna, MD  Cardiologist:  Dr Rodena Goldmann, PA-C   No chief complaint on file.   History of Present Illness: Wayne Pollard is a 49 y.o. male with a history of ***  Wayne Pollard presents for ***   Past Medical History  Diagnosis Date  . Anxiety   . Paroxysmal atrial fibrillation     a. previously failed flecainide/dilt/lopressor;  b. 08/2013 TEE: EF 55-60%, no rwma, no LAA thrombus or veg;  c. s/p PVI and afib ablation 08/2013;  d. chronic xarelto.  . Chest pain     a. 04/2010 Cath: essentially nl cors, nl EF.  Marland Kitchen Syncope     a. 09/2013 adm - not clear if related to bradycardia. Suspected orthostasis.  . Bradycardia     a. 09/2013 adm - pindolol discontinued.    Past Surgical History  Procedure Laterality Date  . Appendectomy    . Cholecystectomy    . Hernia repair    . Cardiac catheterization      Wayne Pollard, Pt reports no CAD  . Tee without cardioversion N/A 08/18/2013    Procedure: TRANSESOPHAGEAL ECHOCARDIOGRAM (TEE);  Surgeon: Lewayne Bunting, MD;  Location: [facility] ENDOSCOPY;  Service: Cardiovascular;  Laterality: N/A;  . Ablation  08/19/2013    PVI by Dr Johney Frame  . Atrial fibrillation ablation N/A 08/19/2013    Procedure: ATRIAL FIBRILLATION ABLATION;  Surgeon: Gardiner Rhyme, MD;  Location: [facility] CATH LAB;  Service: Cardiovascular;  Laterality: N/A;    Current Outpatient Prescriptions  Medication Sig Dispense Refill  . ALPRAZolam (XANAX) 0.5 MG tablet Take 0.5 mg by mouth 3 (three) times daily as needed for anxiety.     . calcipotriene-betamethasone (TACLONEX) ointment Apply 1 application topically 3 (three) times daily as needed (for psoriasis).     Marland Kitchen omeprazole (PRILOSEC) 20 MG capsule Take 20 mg by mouth 2 (two) times daily. Pt states he does not take this everyday (12/14/14)     No current facility-administered medications for this  visit.    Allergies:   Codeine    Social History:  The patient  reports that he has been smoking Cigarettes.  He does not have any smokeless tobacco history on file. He reports that he does not drink alcohol or use illicit drugs.   Family History:  The patient's family history includes Heart attack in his father; Heart failure in his father; Hypertension in his mother.    ROS:  Please see the history of present illness. All other systems are reviewed and negative.    PHYSICAL EXAM: VS:  There were no vitals taken for this visit. , BMI There is no weight on file to calculate BMI. GEN: Well nourished, well developed, in no acute distress HEENT: normal Neck: no JVD, carotid bruits, or masses Cardiac: RRR; no murmurs, rubs, or gallops,no edema  Respiratory:  clear to auscultation bilaterally, normal work of breathing GI: soft, nontender, nondistended, + BS MS: no deformity or atrophy Skin: warm and dry, no rash Neuro:  Strength and sensation are intact Psych: euthymic mood, full affect   EKG:  EKG {ACTION; IS/IS [id number]} ordered today. The ekg ordered today demonstrates ***   Recent Labs: No results found for requested labs within last 365 days.    Lipid Panel    Component Value Date/Time   CHOL 130  06/20/2013 0642   TRIG 111 06/20/2013 0642   HDL 28* 06/20/2013 0642   CHOLHDL 4.6 06/20/2013 0642   VLDL 22 06/20/2013 0642   LDLCALC 80 06/20/2013 0642     Wt Readings from Last 3 Encounters:  12/14/14 205 lb 12.8 oz (93.35 kg)  10/29/13 196 lb (88.905 kg)  10/14/13 198 lb 1.6 oz (89.858 kg)     Other studies Reviewed: Additional studies/ records that were reviewed today include: ***.  ASSESSMENT AND PLAN:  1.  ***   Current medicines are reviewed at length with the patient today.  The patient {ACTIONS; HAS/DOES NOT HAVE:19233} concerns regarding medicines.  The following changes have been made:  {PLAN; NO CHANGE:13088:s}  Labs/ tests ordered today  include: *** No orders of the defined types were placed in this encounter.     Disposition:   FU with *** in {gen number 4-01:027253} {TIME; UNITS DAY/WEEK/MONTH:19136}   Signed, Leanna Battles  05/13/2015 12:51 PM    [facility] [address] [place], [place], [place]  [address] Phone: [phone]; Fax: [phone]

## 2015-05-13 NOTE — Telephone Encounter (Signed)
Charted in error.

## 2015-05-13 NOTE — Telephone Encounter (Signed)
Called pat this morning and LVM to call office this morning.  Need to make an appointment in AF clinic this weel

## 2015-05-13 NOTE — Telephone Encounter (Deleted)
Called patient Wayne Pollard to call back.  Need to comfirm with patient that he is holding his zestoretic and staying hydrating.  Need to make sure he knows about his FLEX appointment next week

## 2015-05-13 NOTE — Telephone Encounter (Signed)
Pt aware Per Dr.Allred's instruction. Pt should be seen today at the afib clinic not the [address] office. Pt aware of the afib clinic appt today @ 3pm. Pt given garage code,directions, and their phone number. Pt appreciative of the call and verbalized understanding.

## 2015-05-13 NOTE — Progress Notes (Signed)
Patient ID: Wayne Pollard, male   DOB: [birth year], 49 y.o.   MRN: [id number]     Primary Care Physician: Raquel Sarna, MD Referring Physician: Dr. Alena Bills is a 49 y.o. male with a h/o afib ablation 11/14 and has been having normal sinus rhythm since then. He has been maintaining SR.He developed back pain 2 weeks ago and received  steriod epidural injections. He says since these injections,  he has been feeling lousy . He describes 3 episodes of jaw/chest pain in this period of time. They only lasted around 5 mins but definitely got his attention. One episode was while he as driving. Another was when he was at rest and the one this morning was the hardest episode, he woke up with it, lasting 5 mins and associated with a heart rate of 140. He does admit to anxiety, but all the men in his family have died in their late 40's/early 36's with a heart attack or stroke. His father died of a massive heart attack in his late 38's and did not have any risk factors such as tobacco abuse/bad cholesterol or DM. EKG does show T wave inversion in the inferior leads that has been present an old EKG's. He does smoke a cigar a night, but has quit  smoking cigarettes. No alcohol use. No chest pain present now. Other than the elevated heart rate mentioned no   afib that he is aware of. Normal cath in 2011. Chadsvasc score of 0.  Today, he denies symptoms of palpitations, chest pain, shortness of breath, orthopnea, PND, lower extremity edema, dizziness, presyncope, syncope, or neurologic sequela. The patient is tolerating medications without difficulties and is otherwise without complaint today.   Past Medical History  Diagnosis Date  . Anxiety   . Paroxysmal atrial fibrillation     a. previously failed flecainide/dilt/lopressor;  b. 08/2013 TEE: EF 55-60%, no rwma, no LAA thrombus or veg;  c. s/p PVI and afib ablation 08/2013;  d. chronic xarelto.  . Chest pain     a. 04/2010 Cath: essentially nl cors, nl  EF.  Marland Kitchen Syncope     a. 09/2013 adm - not clear if related to bradycardia. Suspected orthostasis.  . Bradycardia     a. 09/2013 adm - pindolol discontinued.   Past Surgical History  Procedure Laterality Date  . Appendectomy    . Cholecystectomy    . Hernia repair    . Cardiac catheterization      [facility], Pt reports no CAD  . Tee without cardioversion N/A 08/18/2013    Procedure: TRANSESOPHAGEAL ECHOCARDIOGRAM (TEE);  Surgeon: Lewayne Bunting, MD;  [facility] ENDOSCOPY;  Service: Cardiovascular;  Laterality: N/A;  . Ablation  08/19/2013    PVI by Dr Johney Frame  . Atrial fibrillation ablation N/A 08/19/2013    Procedure: ATRIAL FIBRILLATION ABLATION;  Surgeon: Gardiner Rhyme, MD;  Location: [facility] CATH LAB;  Service: Cardiovascular;  Laterality: N/A;    Current Outpatient Prescriptions  Medication Sig Dispense Refill  . ALPRAZolam (XANAX) 0.5 MG tablet Take 0.5 mg by mouth 3 (three) times daily as needed for anxiety.     Marland Kitchen esomeprazole (NEXIUM) 20 MG capsule Take 20 mg by mouth daily at 12 noon.    Marland Kitchen HYDROcodone-acetaminophen (NORCO/VICODIN) 5-325 MG per tablet Take 1 tablet by mouth every 6 (six) hours as needed for moderate pain.    Marland Kitchen aspirin EC 81 MG tablet Take 1 tablet (81 mg total) by mouth  daily.    . calcipotriene-betamethasone (TACLONEX) ointment Apply 1 application topically 3 (three) times daily as needed (for psoriasis).      No current facility-administered medications for this encounter.    Allergies  Allergen Reactions  . Codeine Nausea And Vomiting    History   Social History  . Marital Status: Married    Spouse Name: N/A  . Number of Children: 2  . Years of Education: N/A   Occupational History  . communications    Social History Main Topics  . Smoking status: Current Every Day Smoker    Types: Cigarettes    Last Attempt to Quit: 12/24/2012  . Smokeless tobacco: Not on file  . Alcohol Use: No  . Drug Use: No  . Sexual Activity: Not on file    Other Topics Concern  . Not on file   Social History Narrative   Pt lives in [place] with spouse.  Owns a Nurse, adult.    Family History  Problem Relation Age of Onset  . Hypertension Mother   . Heart attack Father     died @ 74.  Marland Kitchen Heart failure Father     ROS- All systems are reviewed and negative except as per the HPI above  Physical Exam: Filed Vitals:   05/13/15 1448  BP: 146/84  Pulse: 74  Height: 6' (1.829 m)  Weight: 193 lb 9.6 oz (87.816 kg)    GEN- The patient is well appearing, alert and oriented x 3 today.   Head- normocephalic, atraumatic Eyes-  Sclera clear, conjunctiva pink Ears- hearing intact Oropharynx- clear Neck- supple, no JVP Lymph- no cervical lymphadenopathy Lungs- Clear to ausculation bilaterally, normal work of breathing Heart- Regular rate and rhythm, no murmurs, rubs or gallops, PMI not laterally displaced GI- soft, NT, ND, + BS Extremities- no clubbing, cyanosis, or edema MS- no significant deformity or atrophy Skin- no rash or lesion Psych- euthymic mood, full affect Neuro- strength and sensation are intact  EKG-NSR, rt atrial enlargement, septal infarct, age undetermined, T wave abnormality, consider inferior ischemia, unchanged from previous.  Assessment and Plan: 1. Chest/jaw pain With family history, new onset of symptoms and with abnormal EKG, worrisome for ischemia.  Will schedule for lexi myoview due to ruptured disc and new onset back pain. Pt thinks if he is having a good day he may be able to walk on treadmill. Start baby asa a day. Encouraged to stop smoking cigars If symptoms are sustained go to the ER. Surface echo  2. Tachycardia Only mentions one episode of elevated heart beat of 140 this am, states may have felt a few palps but usually  Brief, 1-3 mins. Denies snoring history. Not convinced this is afib. Offered monitor but he declines.  Offered pill in pocket and he declines and the episodes are  brief, so I am not sure would help at this point.  3. Anxiety He has used xanax in the past but hasn't needed in years. Last time his anxiety got really bad was prior to ablation. His current symptoms may be causing increased anxiety or anxiety worsening symptoms.   He will be notified with the above results, and if needed additional medical care will be scheduled.  Otherwise, see Dr. Johney Frame as scheduled 8/31.

## 2015-05-14 ENCOUNTER — Telehealth (HOSPITAL_COMMUNITY): Payer: Self-pay | Admitting: *Deleted

## 2015-05-14 NOTE — Progress Notes (Signed)
Appointment with Rudi Coco, NP at the afib clinic.

## 2015-05-17 ENCOUNTER — Ambulatory Visit (HOSPITAL_BASED_OUTPATIENT_CLINIC_OR_DEPARTMENT_OTHER): Payer: BLUE CROSS/BLUE SHIELD

## 2015-05-17 ENCOUNTER — Other Ambulatory Visit: Payer: Self-pay

## 2015-05-17 ENCOUNTER — Ambulatory Visit (HOSPITAL_COMMUNITY): Payer: BLUE CROSS/BLUE SHIELD | Attending: Cardiovascular Disease

## 2015-05-17 DIAGNOSIS — I350 Nonrheumatic aortic (valve) stenosis: Secondary | ICD-10-CM | POA: Diagnosis not present

## 2015-05-17 DIAGNOSIS — Z87891 Personal history of nicotine dependence: Secondary | ICD-10-CM | POA: Insufficient documentation

## 2015-05-17 DIAGNOSIS — R079 Chest pain, unspecified: Secondary | ICD-10-CM | POA: Diagnosis present

## 2015-05-17 DIAGNOSIS — Z8249 Family history of ischemic heart disease and other diseases of the circulatory system: Secondary | ICD-10-CM | POA: Diagnosis not present

## 2015-05-17 DIAGNOSIS — R0789 Other chest pain: Secondary | ICD-10-CM | POA: Diagnosis not present

## 2015-05-17 DIAGNOSIS — I517 Cardiomegaly: Secondary | ICD-10-CM | POA: Diagnosis not present

## 2015-05-17 LAB — MYOCARDIAL PERFUSION IMAGING
CHL CUP NUCLEAR SDS: 2
CHL CUP NUCLEAR SSS: 4
CHL RATE OF PERCEIVED EXERTION: 16
Estimated workload: 10.4 METS
Exercise duration (min): 9 min
LV dias vol: 143 mL
LVSYSVOL: 69 mL
MPHR: 171 {beats}/min
Peak HR: 155 {beats}/min
Percent HR: 90 %
RATE: 0.31
Rest HR: 67 {beats}/min
SRS: 2
TID: 0.96

## 2015-05-17 MED ORDER — TECHNETIUM TC 99M SESTAMIBI GENERIC - CARDIOLITE
9.6000 | Freq: Once | INTRAVENOUS | Status: AC | PRN
  Administered 2015-05-17: 10 via INTRAVENOUS

## 2015-05-17 MED ORDER — TECHNETIUM TC 99M SESTAMIBI GENERIC - CARDIOLITE
30.0000 | Freq: Once | INTRAVENOUS | Status: AC | PRN
  Administered 2015-05-17: 30 via INTRAVENOUS

## 2015-06-16 ENCOUNTER — Encounter: Payer: Self-pay | Admitting: Internal Medicine

## 2015-06-16 ENCOUNTER — Ambulatory Visit (INDEPENDENT_AMBULATORY_CARE_PROVIDER_SITE_OTHER): Payer: BLUE CROSS/BLUE SHIELD | Admitting: Internal Medicine

## 2015-06-16 VITALS — BP 120/90 | HR 72 | Ht 72.0 in | Wt 193.4 lb

## 2015-06-16 DIAGNOSIS — R001 Bradycardia, unspecified: Secondary | ICD-10-CM

## 2015-06-16 DIAGNOSIS — F17200 Nicotine dependence, unspecified, uncomplicated: Secondary | ICD-10-CM

## 2015-06-16 DIAGNOSIS — R0789 Other chest pain: Secondary | ICD-10-CM

## 2015-06-16 DIAGNOSIS — I480 Paroxysmal atrial fibrillation: Secondary | ICD-10-CM | POA: Diagnosis not present

## 2015-06-16 DIAGNOSIS — Z720 Tobacco use: Secondary | ICD-10-CM

## 2015-06-16 MED ORDER — ASPIRIN EC 81 MG PO TBEC: 81.0000 mg | DELAYED_RELEASE_TABLET | Freq: Every day | ORAL | Status: DC

## 2015-06-16 MED ORDER — NITROGLYCERIN 0.4 MG SL SUBL: 0.4000 mg | SUBLINGUAL_TABLET | SUBLINGUAL | Status: DC | PRN

## 2015-06-16 NOTE — Progress Notes (Signed)
PCP: Raquel Sarna, MD Primary Cardiologist:   Dr [place]  Wayne Pollard is a 49 y.o. male who presents today for routine electrophysiology followup.   Doing very well.  He had symptoms reminiscent of afib after receiving oral and IM steroids for back pain several weeks ago.  He also had "chest pressure" at that time.  He was evaluated in the AF clinic and had low risk echo/ myoview.  He has had no further AF.  His chest discomfort has also improved, though he did have minor chest pressure earlier this morning. Today, he denies symptoms of palpitations,  shortness of breath,  lower extremity edema, presyncope, or syncope.  The patient is otherwise without complaint today.   Past Medical History  Diagnosis Date  . Anxiety   . Paroxysmal atrial fibrillation     a. previously failed flecainide/dilt/lopressor;  b. 08/2013 TEE: EF 55-60%, no rwma, no LAA thrombus or veg;  c. s/p PVI and afib ablation 08/2013;  d. chronic xarelto.  . Chest pain     a. 04/2010 Cath: essentially nl cors, nl EF.  Marland Kitchen Syncope     a. 09/2013 adm - not clear if related to bradycardia. Suspected orthostasis.  . Bradycardia     a. 09/2013 adm - pindolol discontinued.   Past Surgical History  Procedure Laterality Date  . Appendectomy    . Cholecystectomy    . Hernia repair    . Cardiac catheterization      [facility], Pt reports no CAD  . Tee without cardioversion N/A 08/18/2013    Procedure: TRANSESOPHAGEAL ECHOCARDIOGRAM (TEE);  Surgeon: Lewayne Bunting, MD;  Location: [facility] ENDOSCOPY;  Service: Cardiovascular;  Laterality: N/A;  . Ablation  08/19/2013    PVI by Dr Johney Frame  . Atrial fibrillation ablation N/A 08/19/2013    Procedure: ATRIAL FIBRILLATION ABLATION;  Surgeon: Gardiner Rhyme, MD;  Location: [facility] CATH LAB;  Service: Cardiovascular;  Laterality: N/A;    Current outpatient prescriptions:  .  ALPRAZolam (XANAX) 0.5 MG tablet, Take 0.5 mg by mouth 3 (three) times daily as needed for anxiety. , Disp: , Rfl:   .  calcipotriene-betamethasone (TACLONEX) ointment, Apply 1 application topically 3 (three) times daily as needed (for psoriasis). , Disp: , Rfl:  .  esomeprazole (NEXIUM) 20 MG capsule, Take 20 mg by mouth daily at 12 noon., Disp: , Rfl:  .  HYDROcodone-acetaminophen (NORCO/VICODIN) 5-325 MG per tablet, Take 1 tablet by mouth every 6 (six) hours as needed for moderate pain., Disp: , Rfl:     Physical Exam: Filed Vitals:   12/14/14 1546  BP: 124/82  Pulse: 68  Height: 6' (1.829 m)  Weight: 205 lb 12.8 oz (93.35 kg)    GEN- The patient is well appearing, alert and oriented x 3 today.   Head- normocephalic, atraumatic Eyes-  Sclera clear, conjunctiva pink Ears- hearing intact Oropharynx- clear Lungs- Clear to ausculation bilaterally, normal work of breathing Heart- Regular rate and rhythm, no murmurs, rubs or gallops, PMI not laterally displaced GI- soft, NT, ND, + BS Extremities- no clubbing, cyanosis, or edema  ekg today reveals sinus rhythm, no ischemic changes myoview and echo are reviewed  Assessment and Plan:  1. Bradycardia Resolved  2. H/o afib s/p ablation Maintaining SR off of AAD, except for recent episode post steroids Would not favor any additional medicine presently.  Avoid steriods going forward Off Noac.-chadsvasc score of 0  3. tobaccco Cessation advised  4. Chest pain Low risk myoview  Would manage medically He feels that he is improved.  His mother is in the hospital at [facility] and quite ill.  He thinks that anxiety may be playing a role Smoking cessation advised Start asa  daily Reports PCP follows lipids and will forward to our office slNTG given Will arrange follow-up with Dr Swaziland  F/u in one year with EP.

## 2015-06-16 NOTE — Patient Instructions (Addendum)
Medication Instructions:  Your physician has recommended you make the following change in your medication:  1) Start ASA 81 mg daily 2) NTG 0.4 mg take as directed   Labwork: None ordered  Testing/Procedures: None ordered  Follow-Up: Your physician recommends that you schedule a follow-up appointment in: 6 weeks with Dr Swaziland  Your physician wants you to follow-up in: 12 months with Dr Jacquiline Doe will receive a reminder letter in the mail two months in advance. If you don't receive a letter, please call our office to schedule the follow-up appointment.    Any Other Special Instructions Will Be Listed Below (If Applicable).  Smoking Cessation Quitting smoking is important to your health and has many advantages. However, it is not always easy to quit since nicotine is a very addictive drug. Oftentimes, people try 3 times or more before being able to quit. This document explains the best ways for you to prepare to quit smoking. Quitting takes hard work and a lot of effort, but you can do it. ADVANTAGES OF QUITTING SMOKING  You will live longer, feel better, and live better.  Your body will feel the impact of quitting smoking almost immediately.  Within 20 minutes, blood pressure decreases. Your pulse returns to its normal level.  After 8 hours, carbon monoxide levels in the blood return to normal. Your oxygen level increases.  After 24 hours, the chance of having a heart attack starts to decrease. Your breath, hair, and body stop smelling like smoke.  After 48 hours, damaged nerve endings begin to recover. Your sense of taste and smell improve.  After 72 hours, the body is virtually free of nicotine. Your bronchial tubes relax and breathing becomes easier.  After 2 to 12 weeks, lungs can hold more air. Exercise becomes easier and circulation improves.  The risk of having a heart attack, stroke, cancer, or lung disease is greatly reduced.  After 1 year, the risk of coronary  heart disease is cut in half.  After 5 years, the risk of stroke falls to the same as a nonsmoker.  After 10 years, the risk of lung cancer is cut in half and the risk of other cancers decreases significantly.  After 15 years, the risk of coronary heart disease drops, usually to the level of a nonsmoker.  If you are pregnant, quitting smoking will improve your chances of having a healthy baby.  The people you live with, especially any children, will be healthier.  You will have extra money to spend on things other than cigarettes. QUESTIONS TO THINK ABOUT BEFORE ATTEMPTING TO QUIT You may want to talk about your answers with your health care provider.  Why do you want to quit?  If you tried to quit in the past, what helped and what did not?  What will be the most difficult situations for you after you quit? How will you plan to handle them?  Who can help you through the tough times? Your family? Friends? A health care provider?  What pleasures do you get from smoking? What ways can you still get pleasure if you quit? Here are some questions to ask your health care provider:  How can you help me to be successful at quitting?  What medicine do you think would be best for me and how should I take it?  What should I do if I need more help?  What is smoking withdrawal like? How can I get information on withdrawal? GET READY  Set a quit  date.  Change your environment by getting rid of all cigarettes, ashtrays, matches, and lighters in your home, car, or work. Do not let people smoke in your home.  Review your past attempts to quit. Think about what worked and what did not. GET SUPPORT AND ENCOURAGEMENT You have a better chance of being successful if you have help. You can get support in many ways.  Tell your family, friends, and coworkers that you are going to quit and need their support. Ask them not to smoke around you.  Get individual, group, or telephone counseling and  support. Programs are available at [employer] and health centers. Call your local health department for information about programs in your area.  Spiritual beliefs and practices may help some smokers quit.  Download a "quit meter" on your computer to keep track of quit statistics, such as how long you have gone without smoking, cigarettes not smoked, and money saved.  Get a self-help book about quitting smoking and staying off tobacco. LEARN NEW SKILLS AND BEHAVIORS  Distract yourself from urges to smoke. Talk to someone, go for a walk, or occupy your time with a task.  Change your normal routine. Take a different route to work. Drink tea instead of coffee. Eat breakfast in a different place.  Reduce your stress. Take a hot bath, exercise, or read a book.  Plan something enjoyable to do every day. Reward yourself for not smoking.  Explore interactive web-based programs that specialize in helping you quit. GET MEDICINE AND USE IT CORRECTLY Medicines can help you stop smoking and decrease the urge to smoke. Combining medicine with the above behavioral methods and support can greatly increase your chances of successfully quitting smoking.  Nicotine replacement therapy helps deliver nicotine to your body without the negative effects and risks of smoking. Nicotine replacement therapy includes nicotine gum, lozenges, inhalers, nasal sprays, and skin patches. Some may be available over-the-counter and others require a prescription.  Antidepressant medicine helps people abstain from smoking, but how this works is unknown. This medicine is available by prescription.  Nicotinic receptor partial agonist medicine simulates the effect of nicotine in your brain. This medicine is available by prescription. Ask your health care provider for advice about which medicines to use and how to use them based on your health history. Your health care provider will tell you what side effects to look out for if  you choose to be on a medicine or therapy. Carefully read the information on the package. Do not use any other product containing nicotine while using a nicotine replacement product.  RELAPSE OR DIFFICULT SITUATIONS Most relapses occur within the first 3 months after quitting. Do not be discouraged if you start smoking again. Remember, most people try several times before finally quitting. You may have symptoms of withdrawal because your body is used to nicotine. You may crave cigarettes, be irritable, feel very hungry, cough often, get headaches, or have difficulty concentrating. The withdrawal symptoms are only temporary. They are strongest when you first quit, but they will go away within 10-14 days. To reduce the chances of relapse, try to:  Avoid drinking alcohol. Drinking lowers your chances of successfully quitting.  Reduce the amount of caffeine you consume. Once you quit smoking, the amount of caffeine in your body increases and can give you symptoms, such as a rapid heartbeat, sweating, and anxiety.  Avoid smokers because they can make you want to smoke.  Do not let weight gain distract you. Many smokers  will gain weight when they quit, usually less than 10 pounds. Eat a healthy diet and stay active. You can always lose the weight gained after you quit.  Find ways to improve your mood other than smoking. FOR MORE INFORMATION  www.smokefree.gov  Document Released: 09/26/2001 Document Revised: 02/16/2014 Document Reviewed: 01/11/2012 [facility] Patient Information 2015 [place], [place]. This information is not intended to replace advice given to you by your health care provider. Make sure you discuss any questions you have with your health care provider.

## 2015-06-22 ENCOUNTER — Telehealth: Payer: Self-pay | Admitting: Cardiology

## 2015-07-20 ENCOUNTER — Ambulatory Visit: Payer: BLUE CROSS/BLUE SHIELD | Admitting: Cardiology

## 2015-07-26 ENCOUNTER — Ambulatory Visit: Payer: BLUE CROSS/BLUE SHIELD | Admitting: Cardiology

## 2016-02-09 ENCOUNTER — Telehealth: Payer: Self-pay | Admitting: Internal Medicine

## 2016-02-09 NOTE — Telephone Encounter (Signed)
Patient has canceled and or no showed for Dr SwazilandJordan.  Will forward to Dr Johney FrameAllred to see if he will clear.  Patient needs to get in with Dr SwazilandJordan for general cardiology soon

## 2016-02-09 NOTE — Telephone Encounter (Signed)
New message     Patient calling would like for Dr. Johney FrameAllred to give  Him a call please.     Patient is aware of messages from nurse below to make an appt .     appt on  4.27.2017 with Dr. SwazilandJordan.

## 2016-02-09 NOTE — Telephone Encounter (Signed)
Request for surgical clearance:  What type of surgery is being performed? ACDF infusion of C5 thru C6  1. When is this surgery scheduled? 02/14/16   2. Are there any medications that need to be held prior to surgery and how long?NO   3. Name of physician performing surgery? Dr. Precious Gildingorrealba  4. What is your office phone and fax number? Fax#[phone] ph#[phone]

## 2016-02-10 ENCOUNTER — Encounter: Payer: Self-pay | Admitting: Cardiology

## 2016-02-10 ENCOUNTER — Ambulatory Visit (INDEPENDENT_AMBULATORY_CARE_PROVIDER_SITE_OTHER): Payer: BLUE CROSS/BLUE SHIELD | Admitting: Cardiology

## 2016-02-10 VITALS — BP 123/78 | HR 62 | Ht 72.0 in | Wt 197.0 lb

## 2016-02-10 DIAGNOSIS — M5090 Cervical disc disorder, unspecified, unspecified cervical region: Secondary | ICD-10-CM

## 2016-02-10 DIAGNOSIS — I480 Paroxysmal atrial fibrillation: Secondary | ICD-10-CM

## 2016-02-10 HISTORY — DX: Cervical disc disorder, unspecified, unspecified cervical region: M50.90

## 2016-02-10 NOTE — Telephone Encounter (Signed)
Dr.Jordan cleared patient for upcoming neck surgery.Clearance letter faxed to [facility] at fax # [phone].

## 2016-02-10 NOTE — Patient Instructions (Signed)
Continue your current therapy  We will clear you for surgery  I will see you in one year.

## 2016-02-10 NOTE — Progress Notes (Signed)
Wayne Pollard Date of Birth: 07/04/1966 Medical Record #[id number]  History of Present Illness: Mr. Wayne Pollard is seen for pre op surgical clearance. He has a history of atrial fibrillation. He failed antiarrhythmic drug therapy and did not tolerate medication well. He underwent Afib ablation by Dr. Johney FrameAllred in November 2014. He has had no recurrent Afib since then. He had a normal Myoview study and Echocardiogram in August 2016. He denies any chest pain, SOB, palpitations, or dizziness. 2 weeks ago developed severe neck pain and has a ruptured cervical disc. Surgery is planned on Monday.  Current Outpatient Prescriptions on File Prior to Visit  Medication Sig Dispense Refill  . ALPRAZolam (XANAX) 0.5 MG tablet Take 0.5 mg by mouth 3 (three) times daily as needed for anxiety.     Wayne Pollard. esomeprazole (NEXIUM) 20 MG capsule Take 20 mg by mouth daily at 12 noon.    Wayne Pollard. HYDROcodone-acetaminophen (NORCO/VICODIN) 5-325 MG per tablet Take 1 tablet by mouth every 6 (six) hours as needed for moderate pain.    . nitroGLYCERIN (NITROSTAT) 0.4 MG SL tablet Place 1 tablet (0.4 mg total) under the tongue every 5 (five) minutes as needed for chest pain. 90 tablet 3   No current facility-administered medications on file prior to visit.    Allergies  Allergen Reactions  . Codeine Nausea And Vomiting    Past Medical History  Diagnosis Date  . Anxiety   . Paroxysmal atrial fibrillation (HCC)     a. previously failed flecainide/dilt/lopressor;  b. 08/2013 TEE: EF 55-60%, no rwma, no LAA thrombus or veg;  c. s/p PVI and afib ablation 08/2013;  d. chronic xarelto.  . Chest pain     a. 04/2010 Cath: essentially nl cors, nl EF.  Wayne Pollard. Syncope     a. 09/2013 adm - not clear if related to bradycardia. Suspected orthostasis.  . Bradycardia     a. 09/2013 adm - pindolol discontinued.    Past Surgical History  Procedure Laterality Date  . Appendectomy    . Cholecystectomy    . Hernia repair    . Cardiac catheterization        [facility], Pt reports no CAD  . Tee without cardioversion N/A 08/18/2013    Procedure: TRANSESOPHAGEAL ECHOCARDIOGRAM (TEE);  Surgeon: Lewayne BuntingBrian S Crenshaw, MD;  [facility];  Service: Cardiovascular;  Laterality: N/A;  . Ablation  08/19/2013    PVI by Dr Johney FrameAllred  . Atrial fibrillation ablation N/A 08/19/2013    Procedure: ATRIAL FIBRILLATION ABLATION;  Surgeon: Gardiner RhymeJames D Allred, MD;  Location: [facility] CATH LAB;  Service: Cardiovascular;  Laterality: N/A;    History  Smoking status  . Current Every Day Smoker  . Types: Cigarettes  . Last Attempt to Quit: 12/24/2012  Smokeless tobacco  . Not on file    History  Alcohol Use No    Family History  Problem Relation Age of Onset  . Hypertension Mother   . Heart attack Father     died @ 1659.  Wayne Pollard. Heart failure Father     Review of Systems: The review of systems is as noted in HPI. He is smoking 6-8 cigs/day. All other systems were reviewed and are negative.  Physical Exam: BP 123/78 mmHg  Pulse 62  Ht 6' (1.829 m)  Wt 89.359 kg (197 lb)  BMI 26.71 kg/m2 He is a pleasant white male in no acute distress. HEENT: Normal. Neck is without JVD, adenopathy, thyromegaly, or bruits. Lungs: Clear Cardiovascular: Regular rate and  rhythm. Normal S1 and S2. No gallop or click. He has a soft grade 1/6 systolic ejection murmur heard best at the left sternal border and apex. PMI is normal. Abdomen: Soft and nontender. No masses organosplenomegaly. No bruits. Bowel sounds positive. Extremities: No cyanosis or edema. Pulses are 2+ and symmetric. Skin: Warm and dry Neuro: Alert and oriented x3. Cranial nerves II through XII are intact.  LABORATORY DATA:   Assessment / Plan: 1. Paroxysmal atrial fibrillation. S/p successful ablation in 2014 without recurrence. Normal Echo and Myoview study in last year. He is asymptomatic. He is low risk for planned surgery.  2. Tobacco use. Recommend cessation..  Follow up in one year.

## 2016-02-17 NOTE — Telephone Encounter (Signed)
error 

## 2016-06-16 DIAGNOSIS — K219 Gastro-esophageal reflux disease without esophagitis: Secondary | ICD-10-CM | POA: Diagnosis not present

## 2016-06-16 DIAGNOSIS — L409 Psoriasis, unspecified: Secondary | ICD-10-CM | POA: Diagnosis not present

## 2016-06-16 DIAGNOSIS — M545 Low back pain: Secondary | ICD-10-CM | POA: Diagnosis not present

## 2016-06-16 DIAGNOSIS — I480 Paroxysmal atrial fibrillation: Secondary | ICD-10-CM | POA: Diagnosis not present

## 2016-06-16 DIAGNOSIS — F419 Anxiety disorder, unspecified: Secondary | ICD-10-CM | POA: Diagnosis not present

## 2016-07-14 DIAGNOSIS — M545 Low back pain: Secondary | ICD-10-CM | POA: Diagnosis not present

## 2016-07-14 DIAGNOSIS — F419 Anxiety disorder, unspecified: Secondary | ICD-10-CM | POA: Diagnosis not present

## 2016-08-21 DIAGNOSIS — K219 Gastro-esophageal reflux disease without esophagitis: Secondary | ICD-10-CM | POA: Diagnosis not present

## 2016-08-21 DIAGNOSIS — Z23 Encounter for immunization: Secondary | ICD-10-CM | POA: Diagnosis not present

## 2016-08-21 DIAGNOSIS — F419 Anxiety disorder, unspecified: Secondary | ICD-10-CM | POA: Diagnosis not present

## 2016-08-21 DIAGNOSIS — L409 Psoriasis, unspecified: Secondary | ICD-10-CM | POA: Diagnosis not present

## 2016-08-21 DIAGNOSIS — I480 Paroxysmal atrial fibrillation: Secondary | ICD-10-CM | POA: Diagnosis not present

## 2016-08-21 DIAGNOSIS — Z1389 Encounter for screening for other disorder: Secondary | ICD-10-CM | POA: Diagnosis not present

## 2016-09-18 DIAGNOSIS — I480 Paroxysmal atrial fibrillation: Secondary | ICD-10-CM | POA: Diagnosis not present

## 2016-09-18 DIAGNOSIS — K219 Gastro-esophageal reflux disease without esophagitis: Secondary | ICD-10-CM | POA: Diagnosis not present

## 2016-09-18 DIAGNOSIS — M545 Low back pain: Secondary | ICD-10-CM | POA: Diagnosis not present

## 2016-09-18 DIAGNOSIS — I10 Essential (primary) hypertension: Secondary | ICD-10-CM | POA: Diagnosis not present

## 2016-09-20 DIAGNOSIS — I480 Paroxysmal atrial fibrillation: Secondary | ICD-10-CM | POA: Diagnosis not present

## 2016-09-20 DIAGNOSIS — L409 Psoriasis, unspecified: Secondary | ICD-10-CM | POA: Diagnosis not present

## 2016-09-20 DIAGNOSIS — R1110 Vomiting, unspecified: Secondary | ICD-10-CM | POA: Diagnosis not present

## 2016-09-20 DIAGNOSIS — K219 Gastro-esophageal reflux disease without esophagitis: Secondary | ICD-10-CM | POA: Diagnosis not present

## 2016-10-16 ENCOUNTER — Encounter: Payer: Self-pay | Admitting: Cardiology

## 2016-10-16 ENCOUNTER — Observation Stay (HOSPITAL_COMMUNITY)
Admission: AD | Admit: 2016-10-16 | Discharge: 2016-10-17 | Disposition: A | Discharge status: Home / Self Care | Payer: BLUE CROSS/BLUE SHIELD | Source: Other Acute Inpatient Hospital | Attending: Internal Medicine | Admitting: Internal Medicine

## 2016-10-16 ENCOUNTER — Encounter (HOSPITAL_COMMUNITY): Payer: Self-pay | Admitting: Cardiology

## 2016-10-16 DIAGNOSIS — Z79899 Other long term (current) drug therapy: Secondary | ICD-10-CM | POA: Diagnosis not present

## 2016-10-16 DIAGNOSIS — I10 Essential (primary) hypertension: Secondary | ICD-10-CM

## 2016-10-16 DIAGNOSIS — F17210 Nicotine dependence, cigarettes, uncomplicated: Secondary | ICD-10-CM | POA: Diagnosis not present

## 2016-10-16 DIAGNOSIS — I480 Paroxysmal atrial fibrillation: Principal | ICD-10-CM | POA: Insufficient documentation

## 2016-10-16 DIAGNOSIS — I700 Atherosclerosis of aorta: Secondary | ICD-10-CM

## 2016-10-16 DIAGNOSIS — Z9049 Acquired absence of other specified parts of digestive tract: Secondary | ICD-10-CM | POA: Diagnosis not present

## 2016-10-16 DIAGNOSIS — Z87898 Personal history of other specified conditions: Secondary | ICD-10-CM

## 2016-10-16 DIAGNOSIS — IMO0001 Reserved for inherently not codable concepts without codable children: Secondary | ICD-10-CM | POA: Insufficient documentation

## 2016-10-16 DIAGNOSIS — F419 Anxiety disorder, unspecified: Secondary | ICD-10-CM | POA: Diagnosis not present

## 2016-10-16 DIAGNOSIS — Z0389 Encounter for observation for other suspected diseases and conditions ruled out: Secondary | ICD-10-CM

## 2016-10-16 HISTORY — DX: Personal history of other specified conditions: Z87.898

## 2016-10-16 HISTORY — DX: Essential (primary) hypertension: I10

## 2016-10-16 HISTORY — DX: Atherosclerosis of aorta: I70.0

## 2016-10-16 HISTORY — DX: Cervical disc disorder, unspecified, unspecified cervical region: M50.90

## 2016-10-16 HISTORY — DX: Paroxysmal atrial fibrillation: I48.0

## 2016-10-16 LAB — CBC
HEMATOCRIT: 46.2 % (ref 39.0–52.0)
HEMOGLOBIN: 16.5 g/dL (ref 13.0–17.0)
MCH: 32.9 pg (ref 26.0–34.0)
MCHC: 35.7 g/dL (ref 30.0–36.0)
MCV: 92 fL (ref 78.0–100.0)
Platelets: 138 10*3/uL — ABNORMAL LOW (ref 150–400)
RBC: 5.02 MIL/uL (ref 4.22–5.81)
RDW: 12.5 % (ref 11.5–15.5)
WBC: 11 10*3/uL — ABNORMAL HIGH (ref 4.0–10.5)

## 2016-10-16 LAB — PROTIME-INR
INR: 1.02
Prothrombin Time: 13.4 seconds (ref 11.4–15.2)

## 2016-10-16 LAB — BASIC METABOLIC PANEL
ANION GAP: 9 (ref 5–15)
BUN: 14 mg/dL (ref 6–20)
CO2: 24 mmol/L (ref 22–32)
Calcium: 8.7 mg/dL — ABNORMAL LOW (ref 8.9–10.3)
Chloride: 102 mmol/L (ref 101–111)
Creatinine, Ser: 0.87 mg/dL (ref 0.61–1.24)
GFR calc Af Amer: >60 mL/min (ref >60)
GLUCOSE: 104 mg/dL — AB (ref 65–99)
POTASSIUM: 3.8 mmol/L (ref 3.5–5.1)
SODIUM: 135 mmol/L (ref 135–145)

## 2016-10-16 LAB — MRSA PCR SCREENING: MRSA by PCR: NEGATIVE

## 2016-10-16 LAB — TSH: TSH: 0.815 u[IU]/mL (ref 0.350–4.500)

## 2016-10-16 LAB — T4, FREE: FREE T4: 0.84 ng/dL (ref 0.61–1.12)

## 2016-10-16 MED ORDER — SODIUM CHLORIDE 0.45 % IV SOLN: INTRAVENOUS | Status: DC

## 2016-10-16 MED ORDER — ALPRAZOLAM 0.5 MG PO TABS
0.5000 mg | ORAL_TABLET | Freq: Three times a day (TID) | ORAL | Status: DC | PRN
  Administered 2016-10-16 – 2016-10-17 (×2): 0.5 mg via ORAL
  Filled 2016-10-16 (×2): qty 1

## 2016-10-16 MED ORDER — ONDANSETRON HCL 4 MG/2ML IJ SOLN: 4.0000 mg | Freq: Four times a day (QID) | INTRAMUSCULAR | Status: DC | PRN

## 2016-10-16 MED ORDER — ACETAMINOPHEN 325 MG PO TABS: 650.0000 mg | ORAL_TABLET | ORAL | Status: DC | PRN

## 2016-10-16 MED ORDER — HEPARIN (PORCINE) IN NACL 100-0.45 UNIT/ML-% IJ SOLN
1400.0000 [IU]/h | INTRAMUSCULAR | Status: DC
  Administered 2016-10-16 – 2016-10-17 (×2): 1400 [IU]/h via INTRAVENOUS
  Filled 2016-10-16 (×2): qty 250

## 2016-10-16 MED ORDER — METOPROLOL TARTRATE 25 MG PO TABS
25.0000 mg | ORAL_TABLET | Freq: Two times a day (BID) | ORAL | Status: DC
  Administered 2016-10-16 – 2016-10-17 (×2): 25 mg via ORAL
  Filled 2016-10-16 (×2): qty 1

## 2016-10-16 MED ORDER — HEPARIN BOLUS VIA INFUSION
5000.0000 [IU] | Freq: Once | INTRAVENOUS | Status: AC
Start: 2016-10-16 — End: 2016-10-16
  Administered 2016-10-16: 5000 [IU] via INTRAVENOUS
  Filled 2016-10-16: qty 5000

## 2016-10-16 MED ORDER — TEMAZEPAM 15 MG PO CAPS
15.0000 mg | ORAL_CAPSULE | Freq: Every day | ORAL | Status: DC
  Administered 2016-10-16: 15 mg via ORAL
  Filled 2016-10-16: qty 1

## 2016-10-16 MED ORDER — DILTIAZEM HCL-DEXTROSE 100-5 MG/100ML-% IV SOLN (PREMIX)
5.0000 mg/h | INTRAVENOUS | Status: DC
  Administered 2016-10-16: 15 mg/h via INTRAVENOUS
  Filled 2016-10-16: qty 100

## 2016-10-16 NOTE — Progress Notes (Addendum)
ANTICOAGULATION CONSULT NOTE - Initial Consult  Pharmacy Consult for Heparin Indication: atrial fibrillation  Allergies  Allergen Reactions  . Codeine Nausea And Vomiting    Patient Measurements: Height: 6' (182.9 cm) Weight: 203 lb (92.1 kg) IBW/kg (Calculated) : 77.6 Heparin Dosing Weight: 92.1 kg  Vital Signs: Temp: 97.6 F (36.4 C) (01/01 1503) Temp Source: Oral (01/01 1503) BP: 109/85 (01/01 1503) Pulse Rate: 108 (01/01 1600)  Labs: No results for input(s): HGB, HCT, PLT, APTT, LABPROT, INR, HEPARINUNFRC, HEPRLOWMOCWT, CREATININE, CKTOTAL, CKMB, TROPONINI in the last 72 hours.  CrCl cannot be calculated (Patient's most recent lab result is older than the maximum 21 days allowed.).   Medical History: Past Medical History:  Diagnosis Date  . Anxiety   . Bradycardia    a. 09/2013 adm - pindolol discontinued.  . Cervical disc disease 02/10/2016  . Chest pain    a. 04/2010 Cath: essentially nl cors, nl EF. Myoview low risk Aug 2016  . Paroxysmal atrial fibrillation (HCC)    a. previously failed flecainide/dilt/lopressor;  b. 08/2013 TEE: EF 55-60%, no rwma, no LAA thrombus or veg;  c. s/p PVI and afib ablation 08/2013;  d. chronic xarelto.  . Syncope    a. 09/2013 adm - not clear if related to bradycardia. Suspected orthostasis.    Assessment: 51 year old male with history of paroxysmal atrial fibrillation treated with Xarelto for 6 months after ablation in November 2014 but because CHADS2VASC was thought to be 0 was taken off anticoagulation. Now admitted with recurrent atrial fibrillation to start IV heparin therapy per pharmacy dosing. No bleeding noted. No history of renal disease noted.   Goal of Therapy:  Heparin level 0.3-0.7 units/ml Monitor platelets by anticoagulation protocol: Yes   Plan:  Stat CBC, aPTT, and INR Give 5000 units bolus x 1 Start heparin infusion at 1400 units/hr Check anti-Xa level in 6 hours and daily while on heparin Continue to  monitor H&H and platelets  Link SnufferJessica Emit Kuenzel, PharmD, BCPS Clinical Pharmacist [phone] 10/16/2016,5:06 PM   Addnedum: Stat labs reviewed. H/H within normal limits. Platelets on low side at 132 (past values have been 150-180). INR normal.   Continue current plan. Monitor platelets closely.  Link SnufferJessica Ylonda Storr, PharmD, BCPS Clinical Pharmacist [phone] 10/16/2016, 7:12 PM

## 2016-10-16 NOTE — Progress Notes (Addendum)
Pt has had several pauses >2 sec. Per diltiazem orders, gtt discontinued. Text page to cards fellow to assess need for PO cardizem. Patient remains afib, rate 70s-90s. Will continue to monitor.   Update: 10:10 PM Per cards, no need for PO cardizem, no new orders. Will continue to monitor.

## 2016-10-16 NOTE — H&P (Signed)
History and Physical   Admit date: 10/16/2016 Name:  Wayne Pollard Medical record number: [id number] DOB/Age:  51-16-1967  51 y.o. male  Referring Physician:   [facility] emergency room  Primary Cardiologist:  Dr. Johney Frame  Primary Physician:   Dr. Alexia Freestone  Chief complaint/reason for admission: Weakness, atrial fibrillation  HPI:  This 51 year old male has a history of paroxysmal atrial fibrillation.  He has a long history of paroxysmal atrial fibrillation and has had syncope.  He was treated previously with flecainide and metoprolol and diltiazem which she found it difficult to tolerate.  Because of increasing frequency and severity of atrial fibrillation which he was able to terminate with vagal maneuvers sometimes he underwent an ablation by Dr. Johney Frame in November 2014.  He was on Xarelto for 6 months afterwards and because his CHA2DS2VASC score was thought to be 0 was taken off of this and has not been on any medicines for atrial fibrillation and feeling well.  He had some atypical chest pain and had a myocardial perfusion scan done in the past couple of years that was normal.  He has a history of normal coronary arteries by catheterization in 2011.  He went to bed feeling well last night and awoke this morning severely dizzy and weak and felt he was out of rhythm.  He did not terminate his rhythm with vagal maneuvers and went to the emergency room at [facility] complaining of abdominal and some chest pain.  A troponin was unremarkable.  There is concern about a dissection and a CT scan was done that showed atherosclerosis in his thoracic and abdominal aorta but no evidence of dissection and no other acute abnormalities.  He was treated with diltiazem intravenously as well as beta blockers.  Blood pressure was somewhat borderline and he was transferred here for further evaluation.  He currently is not having any abdominal or chest pain but states that he feels fatigued because he is  out of rhythm.  He was recently diagnosed with elevation of blood pressure and was started on amlodipine.  He has not had any use of alcohol or substance abuse and has not really had any thing out of the ordinary that would've precipitated atrial fibrillation.  He does have a history of psoriatic arthritis currently receiving injections.   Past Medical History:  Diagnosis Date  . Anxiety   . Bradycardia    a. 09/2013 adm - pindolol discontinued.  . Cervical disc disease 02/10/2016  . Chest pain    a. 04/2010 Cath: essentially nl cors, nl EF. Myoview low risk Aug 2016  . Paroxysmal atrial fibrillation (HCC)    a. previously failed flecainide/dilt/lopressor;  b. 08/2013 TEE: EF 55-60%, no rwma, no LAA thrombus or veg;  c. s/p PVI and afib ablation 08/2013;  d. chronic xarelto.  . Syncope    a. 09/2013 adm - not clear if related to bradycardia. Suspected orthostasis.    Past Surgical History:  Procedure Laterality Date  . APPENDECTOMY    . ATRIAL FIBRILLATION ABLATION N/A 08/19/2013   Procedure: ATRIAL FIBRILLATION ABLATION;  Surgeon: Gardiner Rhyme, MD;  Location: [facility] CATH LAB;  Service: Cardiovascular;  Laterality: N/A;  . CARDIAC CATHETERIZATION  2011   [facility], Pt reports no CAD  . CERVICAL LAMINECTOMY    . CHOLECYSTECTOMY    . HERNIA REPAIR    . TEE WITHOUT CARDIOVERSION N/A 08/18/2013   Procedure: TRANSESOPHAGEAL ECHOCARDIOGRAM (TEE);  Surgeon: Lewayne Bunting, MD;  Location: [facility]  ENDOSCOPY;  Service: Cardiovascular;  Laterality: N/A;   Allergies: is allergic to codeine.   Medications: Prior to Admission medications   Medication Sig Start Date End Date Taking? Authorizing Provider  ALPRAZolam Prudy Feeler) 0.5 MG tablet Take 0.5 mg by mouth 3 (three) times daily as needed for anxiety.     Historical Provider, MD  esomeprazole (NEXIUM) 20 MG capsule Take 20 mg by mouth daily at 12 noon.    Historical Provider, MD  HYDROcodone-acetaminophen (NORCO/VICODIN) 5-325 MG per tablet  Take 1 tablet by mouth every 6 (six) hours as needed for moderate pain.    Historical Provider, MD  nitroGLYCERIN (NITROSTAT) 0.4 MG SL tablet Place 1 tablet (0.4 mg total) under the tongue every 5 (five) minutes as needed for chest pain. 06/16/15   Hillis Range, MD    Family History:  Family Status  Relation Status  . Father Deceased  . Mother     Social History:   reports that he has been smoking Cigarettes.  He does not have any smokeless tobacco history on file. He reports that he does not drink alcohol or use drugs.   Social History   Social History Narrative   Pt lives in [place] with spouse.  Owns a Nurse, adult.     Review of Systems: He has significant psoriatic arthritis and has been on injections for this.  Did have significant abdominal pain when he presented to the outside hospital but this is currently resolved. Other than as noted above, the remainder of the review of systems is normal  Physical Exam: BP 109/85 (BP Location: Right Arm)   Pulse (!) 152   Temp 97.6 F (36.4 C) (Oral)   Resp 20   Ht (P) 6' (1.829 m)   SpO2 98%  General appearance: Pleasant white male lying in bed currently in no acute distress Head: Normocephalic, without obvious abnormality, atraumatic Eyes: conjunctivae/corneas clear. PERRL, EOM's intact. Fundi not examined. Throat: lips, mucosa, and tongue normal; teeth and gums normal Neck: no adenopathy, no carotid bruit, no JVD and supple, symmetrical, trachea midline Lungs: clear to auscultation bilaterally Heart: Irregular rate and rhythm, normal S1 and S2, no S3 or murmur Abdomen: soft, non-tender; bowel sounds normal; no masses,  no organomegaly Rectal: deferred Extremities: extremities normal, atraumatic, no cyanosis or edema Pulses: 2+ and symmetric Skin: Skin color, texture, turgor normal. No rashes or lesions Neurologic: Grossly normal  Labs: Reviewed from outside hospital.  Hemoglobin is 18 and hematocrit 51.7, white  count 9200 BUN 17 creatinine 0.8 and liver enzymes were normal.  Troponin is 0.02, BNP was 80.  Potassium 3.9 sodium 140  EKG: Atrial fibrillation with rapid ventricular response no ischemic changes Independently reviewed by me  Radiology: Independently reviewed by me, CT scan shows atherosclerosis in the thoracic and abdominal aorta, no evidence of dissection   IMPRESSIONS: 1.  Recurrent paroxysmal atrial fibrillation which is symptomatic-prior history of ablation-CHA2DS2VASC score of 1 2.  Recent diagnosis of hypertension 3.  Psoriatic arthritis 4.  Tobacco abuse 5.  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  He is currently on diltiazem intravenously and we will titrate this.  If he does not convert overnight we'll keep nothing by mouth for possible cardioversion in the morning.  Electrophysiology consult by Dr. Johney Frame in the morning.  Recheck echocardiogram, recheck thyroid tests.  With atherosclerosis documented will need intensive secondary prevention for vascular disease to be initiated this admission.  Discussed tobacco  cessation with the patient.  Signed: Darden PalmerW. Spencer Tilley, Jr. MD [facility]  10/16/2016, 4:17 PM

## 2016-10-16 NOTE — Progress Notes (Signed)
Verbal orders from Dr. Shirlee LatchMcLean to restart pt home dose of xanax and restoril.

## 2016-10-17 DIAGNOSIS — F419 Anxiety disorder, unspecified: Secondary | ICD-10-CM | POA: Diagnosis not present

## 2016-10-17 DIAGNOSIS — I10 Essential (primary) hypertension: Secondary | ICD-10-CM | POA: Diagnosis not present

## 2016-10-17 DIAGNOSIS — I480 Paroxysmal atrial fibrillation: Principal | ICD-10-CM

## 2016-10-17 DIAGNOSIS — F17210 Nicotine dependence, cigarettes, uncomplicated: Secondary | ICD-10-CM | POA: Diagnosis not present

## 2016-10-17 LAB — LIPID PANEL
CHOL/HDL RATIO: 4.6 ratio
CHOLESTEROL: 161 mg/dL (ref 0–200)
HDL: 35 mg/dL — ABNORMAL LOW (ref >40)
LDL Cholesterol: 105 mg/dL — ABNORMAL HIGH (ref 0–99)
Triglycerides: 104 mg/dL (ref <150)
VLDL: 21 mg/dL (ref 0–40)

## 2016-10-17 LAB — BASIC METABOLIC PANEL
ANION GAP: 7 (ref 5–15)
BUN: 12 mg/dL (ref 6–20)
CO2: 25 mmol/L (ref 22–32)
Calcium: 8.8 mg/dL — ABNORMAL LOW (ref 8.9–10.3)
Chloride: 103 mmol/L (ref 101–111)
Creatinine, Ser: 0.73 mg/dL (ref 0.61–1.24)
GFR calc Af Amer: >60 mL/min (ref >60)
Glucose, Bld: 98 mg/dL (ref 65–99)
Potassium: 3.7 mmol/L (ref 3.5–5.1)
SODIUM: 135 mmol/L (ref 135–145)

## 2016-10-17 LAB — CBC
HCT: 45.4 % (ref 39.0–52.0)
Hemoglobin: 16.3 g/dL (ref 13.0–17.0)
MCH: 33.1 pg (ref 26.0–34.0)
MCHC: 35.9 g/dL (ref 30.0–36.0)
MCV: 92.3 fL (ref 78.0–100.0)
PLATELETS: 176 10*3/uL (ref 150–400)
RBC: 4.92 MIL/uL (ref 4.22–5.81)
RDW: 12.4 % (ref 11.5–15.5)
WBC: 7.8 10*3/uL (ref 4.0–10.5)

## 2016-10-17 LAB — HEPARIN LEVEL (UNFRACTIONATED)
HEPARIN UNFRACTIONATED: 0.39 [IU]/mL (ref 0.30–0.70)
Heparin Unfractionated: 0.31 IU/mL (ref 0.30–0.70)

## 2016-10-17 MED FILL — Ondansetron HCl Inj 4 MG/2ML (2 MG/ML): INTRAMUSCULAR | Qty: 2 | Status: AC

## 2016-10-17 NOTE — Discharge Summary (Signed)
DISCHARGE SUMMARY    Patient ID: Wayne Pollard,  MRN: [id number], DOB/AGE: [birth year] 51 y.o.  Admit date: 10/16/2016 Discharge date: 10/17/2016  Primary Care Physician: Raquel Sarna, MD  Primary Cardiologist: Dr. Swaziland Electrophysiologist: Dr. Johney Frame  Primary Discharge Diagnosis:  1. PAFib     CHA2DS2Vasc is one, not on a/c  Secondary Discharge Diagnosis:  1. HTN  Allergies  Allergen Reactions  . Codeine Nausea And Vomiting     Procedures This Admission:  None  Brief HPI: Wayne Pollard is a 51 y.o. male was admitted from [facility] hospital 10/17/15 with AFib RVR and c/o dizziness, weakness.  Hospital Course:  The patient was admitted to step down on heparin gtt and diltiazem gtt, and PO BB started.  He spontaneously converted to SR this morning.  PMHx of Paroxysmal AFib who underwent ablation Nov 2014 with Dr. Johney Frame, last seen by him Aug 2016 at that time reports of one episode AF in the environment of oral/IV steroid for back pain and in the last month newly dx with HTN and started on amlodipine.  He mentions a slight cough that he was taking OTC Delsum for.  He reports perhaps 3 episodes since 2016 of Af that he was able to stop with bearing down, all episodes including this one he woke with.  He and his wife deny snoring or symptoms of sleep apnea.  This episode though he was unable to stop with vagal maneuvers and felt very weak, dizzy, an element of CP, all of which are completely resolved back in SR.  He had one Trop negative.  CT chest at [facility] was negative for any aortic aneurysm or dissection, lungs were clear.  In discussions of tx options, medicines which he has had intolerances to historically, he prefers to see Dr. Johney Frame to discuss perhaps a repeat ablation given when he is in AF is quite symptomatic.   His CHA2DS2Vasc score is only one, no a/c indicated.  Early follow with Dr. Johney Frame has been arranged.  He remains in SR, VSS and feels well, wants to go home.  He  is advised to avoid strenuous activities, caffine/stimulants of any kind.  He was counseled regarding smoking, while receptive, not certain he is completely on board with cessation.  The patient was examined by Dr.Netra Postlethwait and considered stable for discharge to home.    Physical Exam: Vitals:   10/17/16 0900 10/17/16 1000 10/17/16 1100 10/17/16 1200  BP:      Pulse:    66  Resp: (!) 23 17 (!) 25 (!) 23  Temp:      TempSrc:      SpO2:      Weight:      Height:         Labs:   Lab Results  Component Value Date   WBC 7.8 10/17/2016   HGB 16.3 10/17/2016   HCT 45.4 10/17/2016   MCV 92.3 10/17/2016   PLT 176 10/17/2016     Recent Labs Lab 10/17/16 0246  NA 135  K 3.7  CL 103  CO2 25  BUN 12  CREATININE 0.73  CALCIUM 8.8*  GLUCOSE 98    Discharge Medications:  Allergies as of 10/17/2016      Reactions   Codeine Nausea And Vomiting      Medication List    TAKE these medications   ALPRAZolam 0.5 MG tablet Commonly known as:  XANAX Take 0.5 mg by mouth 3 (three) times daily as  needed for anxiety.   amLODipine 5 MG tablet Commonly known as:  NORVASC Take 5 mg by mouth daily.   esomeprazole 20 MG capsule Commonly known as:  NEXIUM Take 20 mg by mouth daily at 12 noon.   HYDROcodone-acetaminophen 5-325 MG tablet Commonly known as:  NORCO/VICODIN Take 1 tablet by mouth every 6 (six) hours as needed for moderate pain.   nitroGLYCERIN 0.4 MG SL tablet Commonly known as:  NITROSTAT Place 1 tablet (0.4 mg total) under the tongue every 5 (five) minutes as needed for chest pain.   temazepam 15 MG capsule Commonly known as:  RESTORIL Take 15 mg by mouth at bedtime.       Disposition:  Home  Discharge Instructions    Diet - low sodium heart healthy    Complete by:  As directed    Increase activity slowly    Complete by:  As directed      Follow-up Information    Hillis RangeJames Allred, MD Follow up on 10/23/2016.   Specialty:  Cardiology Why:  2:15PM Contact  information: [address] [place] [address] [phone]           Duration of Discharge Encounter: Greater than 30 minutes including physician time.  Norma FredricksonSigned, Renee Ursuy, PA-C 10/17/2016 12:39 PM  EP Attending  Patient seen and examined. Agree with above. The patient is a 51 yo man with PAF, s/p EPS/catheter ablation. The patient was admitted with atrial fib with a RVR and has reverted back to NSR. Exam demonstrates a RRR and clear lungs and no edema and non-focal neuro exam. I have discussed the treatment options. He will follow up with Dr. Fawn KirkJA for additional rec's. He is not on any AA drugs and his stroke risk is low. He will continue his current meds. He is instructed to avoid ETOH, Caffeine, and get plenty of sleep.  Leonia ReevesGregg Taygen Newsome,M.D.

## 2016-10-17 NOTE — Progress Notes (Signed)
ANTICOAGULATION CONSULT NOTE - Follow-up Consult  Pharmacy Consult for Heparin Indication: atrial fibrillation  Allergies  Allergen Reactions  . Codeine Nausea And Vomiting    Patient Measurements: Height: 6' (182.9 cm) Weight: 203 lb (92.1 kg) IBW/kg (Calculated) : 77.6 Heparin Dosing Weight: 92.1 kg  Vital Signs: Temp: 97.5 F (36.4 C) (01/01 2339) Temp Source: Oral (01/01 2339) BP: 121/74 (01/01 2339) Pulse Rate: 65 (01/01 2339)  Labs:  Recent Labs  10/16/16 1749 10/16/16 2203 10/16/16 2328  HGB 16.5  --   --   HCT 46.2  --   --   PLT 138*  --   --   LABPROT 13.4  --   --   INR 1.02  --   --   HEPARINUNFRC  --   --  0.39  CREATININE  --  0.87  --     Estimated Creatinine Clearance: 111.5 mL/min (by C-G formula based on SCr of 0.87 mg/dL).   Assessment: 51 year old male with history of paroxysmal atrial fibrillation treated with Xarelto for 6 months after ablation in November 2014 but because CHADS2VASC was thought to be 0 was taken off anticoagulation. Now admitted with recurrent atrial fibrillation to start IV heparin therapy per pharmacy dosing. Heparin level therapeutic (0.39) on gtt at 1400 units/hr. No bleeding noted.  Goal of Therapy:  Heparin level 0.3-0.7 units/ml Monitor platelets by anticoagulation protocol: Yes   Plan:  Continue heparin at 1400 units/hr F/u daily heparin level to confirm therapeutic  Christoper Fabianaron Shreena Baines, PharmD, BCPS Clinical pharmacist, pager [phone] 10/17/2016,12:33 AM

## 2016-10-17 NOTE — Consult Note (Signed)
ELECTROPHYSIOLOGY CONSULT NOTE    Patient ID: Wayne Pollard MRN: [id number], DOB/AGE: 1966/04/10 51 y.o.  Admit date: 10/16/2016 Date of Consult: 10/17/2016   Primary Physician: Raquel Sarna, MD Primary Cardiologist: Dr. Swaziland Electrophysiologist: Dr. Johney Frame Requesting MD: Dr. Donnie Aho  Reason for Consultation: AFib  HPI: Wayne Pollard is a 51 y.o. male Paroxysmal AFib who underwent ablation Nov 2014 with Dr. Johney Frame, last seen by him Aug 2016 at that time reports of one episode AF in the environment of oral/IV steroid for back pain.  He was doing well of AAD and OAC with CHA2DS2Vasc of zero.  He last saw Dr. Swaziland in April this year for a surgical clearance doing well without reported symptoms.  He was admitted yesterday with c/o dizziness/weakness and felt out of rhythm, attempted vagal maneuvers without success and went to the ER c/o CP as well, record indicates a troponin was unremarkable.  There is concern about a dissection and a CT scan was done that showed atherosclerosis in his thoracic and abdominal aorta but no evidence of dissection and no other acute abnormalities.  He was treated with diltiazem intravenously as well as beta blockers.  Blood pressure was somewhat borderline and he was transferred here for further evaluation from [facility].  He was started on amlodipine for HTN about a month ago.  He reports perhaps 3 episodes since 2016 of Af that he was able to stop with bearing down, all episodes including this one he woke with.  He and his wife deny snoring or symptoms of sleep apnea.  He has had a slight cough for a couple weeks taking OTC Delsum for, no fever os symptoms of illness otherwise.  He reports liberalized eating over the holiday but denies ETOH intake.  [place] [facility]; Trop I: 0.02 BNP 80 K+ 3.9 BUN/Creat 17/0.80 H/H 18/51 WBC 9.2 Plts 199  AFib Hx AFib/PVI ablation Nov 2014 intolerant of meds (Flecainide, metoprolol, diltiazem) all with severe  fatigue Hx of bradycardia resolved off Pindolol Dec 2014  Past Medical History:  Diagnosis Date  . Anxiety   . Bradycardia    a. 09/2013 adm - pindolol discontinued.  . Cervical disc disease 02/10/2016  . Chest pain    a. 04/2010 Cath: essentially nl cors, nl EF. Myoview low risk Aug 2016  . Paroxysmal atrial fibrillation (HCC)    a. previously failed flecainide/dilt/lopressor;  b. 08/2013 TEE: EF 55-60%, no rwma, no LAA thrombus or veg;  c. s/p PVI and afib ablation 08/2013;  d. chronic xarelto.  . Syncope    a. 09/2013 adm - not clear if related to bradycardia. Suspected orthostasis.     Surgical History:  Past Surgical History:  Procedure Laterality Date  . APPENDECTOMY    . ATRIAL FIBRILLATION ABLATION N/A 08/19/2013   Procedure: ATRIAL FIBRILLATION ABLATION;  Surgeon: Gardiner Rhyme, MD;  Location: [facility] CATH LAB;  Service: Cardiovascular;  Laterality: N/A;  . CARDIAC CATHETERIZATION  2011   [facility], Pt reports no CAD  . CERVICAL LAMINECTOMY    . CHOLECYSTECTOMY    . HERNIA REPAIR    . TEE WITHOUT CARDIOVERSION N/A 08/18/2013   Procedure: TRANSESOPHAGEAL ECHOCARDIOGRAM (TEE);  Surgeon: Lewayne Bunting, MD;  Location: [facility] ENDOSCOPY;  Service: Cardiovascular;  Laterality: N/A;     Prescriptions Prior to Admission  Medication Sig Dispense Refill Last Dose  . ALPRAZolam (XANAX) 0.5 MG tablet Take 0.5 mg by mouth 3 (three) times daily as needed for anxiety.  10/15/2016 at Unknown time  . amLODipine (NORVASC) 5 MG tablet Take 5 mg by mouth daily.   10/15/2016 at Unknown time  . esomeprazole (NEXIUM) 20 MG capsule Take 20 mg by mouth daily at 12 noon.   10/15/2016 at Unknown time  . HYDROcodone-acetaminophen (NORCO/VICODIN) 5-325 MG per tablet Take 1 tablet by mouth every 6 (six) hours as needed for moderate pain.   Past Week at Unknown time  . nitroGLYCERIN (NITROSTAT) 0.4 MG SL tablet Place 1 tablet (0.4 mg total) under the tongue every 5 (five) minutes as needed for  chest pain. 90 tablet 3 10/16/2016 at Unknown time  . temazepam (RESTORIL) 15 MG capsule Take 15 mg by mouth at bedtime.   10/15/2016 at Unknown time    Inpatient Medications:  . metoprolol tartrate  25 mg Oral BID  . temazepam  15 mg Oral QHS    Allergies:  Allergies  Allergen Reactions  . Codeine Nausea And Vomiting    Social History   Social History  . Marital status: Married    Spouse name: N/A  . Number of children: 2  . Years of education: N/A   Occupational History  . communications    Social History Main Topics  . Smoking status: Current Every Day Smoker    Types: Cigarettes    Last attempt to quit: 12/24/2012  . Smokeless tobacco: Never Used  . Alcohol use No  . Drug use: No  . Sexual activity: Not on file   Other Topics Concern  . Not on file   Social History Narrative   Pt lives in [place] with spouse.  Owns a Nurse, adult.     Family History  Problem Relation Age of Onset  . Heart attack Father     died @ 38.  Marland Kitchen Heart failure Father   . Hypertension Mother      Review of Systems: All other systems reviewed and are otherwise negative except as noted above.  Physical Exam: Vitals:   10/17/16 0611 10/17/16 0700 10/17/16 0800 10/17/16 0842  BP: 123/89 (!) 136/97 (!) 110/95 120/84  Pulse: 64 (!) 155 65 72  Resp: (!) 24 (!) 22 (!) 26 (!) 22  Temp:    97.4 F (36.3 C)  TempSrc:    Oral  SpO2: 99% 96% 92% 96%  Weight:      Height:        GEN- The patient is well appearing, alert and oriented x 3 today.   HEENT: normocephalic, atraumatic; sclera clear, conjunctiva pink; hearing intact; oropharynx clear; neck supple, no JVP Lymph- no cervical lymphadenopathy Lungs- Clear to ausculation bilaterally, normal work of breathing.  No wheezes, rales, rhonchi Heart- Regular rate and rhythm, no murmurs, rubs or gallops, PMI not laterally displaced GI- soft, non-tender, non-distended Extremities- no clubbing, cyanosis, or edema MS- no  significant deformity or atrophy Skin- warm and dry, no rash or lesion Psych- euthymic mood, full affect Neuro- no gross deficits observed  Labs:   Lab Results  Component Value Date   WBC 7.8 10/17/2016   HGB 16.3 10/17/2016   HCT 45.4 10/17/2016   MCV 92.3 10/17/2016   PLT 176 10/17/2016    Recent Labs Lab 10/17/16 0246  NA 135  K 3.7  CL 103  CO2 25  BUN 12  CREATININE 0.73  CALCIUM 8.8*  GLUCOSE 98      Radiology/Studies: No results found.  EKG: Presenting AFib 131bpm Today is SR, PR , QRS 82ms TELEMETRY: AFib RVR, 130's >>  this AM to SR 70's  05/17/15: TTE Study Conclusions - Left ventricle: The cavity size was normal. Wall thickness was   normal. Systolic function was normal. The estimated ejection   fraction was in the range of 60% to 65%. Wall motion was normal;   there were no regional wall motion abnormalities. - Aortic valve: There was very mild stenosis. Valve area (VTI):   2.37 cm^2. Valve area (Vmax): 2.06 cm^2. Valve area (Vmean): 2.04   cm^2. - Left atrium: The atrium was mildly dilated. 2735m - Right atrium: The atrium was mildly dilated.  05/17/15: Stress myoview  The left ventricular ejection fraction is mildly decreased (45-54%).  Nuclear stress EF: 52%.  There was no ST segment deviation noted during stress.  No T wave inversion was noted during stress.  The study is normal.  This is a low risk study.     Assessment and Plan:   1. PAFib     Rate control rx with diltiazem gtt upon arrival     Spontaneous conversion to SR this AM     CHA2DS2Vasc is now one with new dx of HTN     VSS  Discontinue diltiazem gtt, heparin gtt This is his 1st significant episode of AF since Aug 2016 Historically intolerant of meds including BB apparently with Bradycardia and fatigue Recommend f/u with Dr. Johney FrameAllred outpatient to discuss possible repeat ablation, he would prefer this then trying meds again.    Await Dr. Ladona Ridgelaylor.    Norma FredricksonSigned, Renee  Ursuy, PA-C 10/17/2016 9:26 AM  EP Attending  Patient seen and examined. Agree with above. The patient presents with atrial fib with a RVR and has converted back to NSR. He feels well. He denies chest pain. Palpitations have resolved. ECG shows atrial fib with a RVR and now NSR. He has had trouble in the past with beta blockers. Exam show a well appearing man, No acute distress. Cardiovascular exam revealed a regular rate and rhythm. Lungs are clear bilaterally to auscultation. Extremities demonstrate no edema. Neuro is nonfocal. ECG demonstrates sinus rhythm.  Assessment and plan 1. Paroxysmal atrial fibrillation. The patient will be discharged as he is return to sinus rhythm. He will follow-up with Dr. Johney FrameAllred. Discussions about repeat ablation versus antiarrhythmic drug therapy will be made at that time. The patient's risk for stroke is low and for this reason oral anticoagulation was not recommended. 2. History of chest pain - he is currently pain-free and his cardiac enzymes are unremarkable. No additional workup at this time.  Lewayne BuntingGregg Velton Roselle, M.D.

## 2016-10-17 NOTE — Progress Notes (Signed)
ANTICOAGULATION CONSULT NOTE - Follow-up Consult  Pharmacy Consult for Heparin Indication: atrial fibrillation  Allergies  Allergen Reactions  . Codeine Nausea And Vomiting    Patient Measurements: Height: 6' (182.9 cm) Weight: 203 lb (92.1 kg) IBW/kg (Calculated) : 77.6 Heparin Dosing Weight: 92.1 kg  Vital Signs: Temp: 97.4 F (36.3 C) (01/02 0842) Temp Source: Oral (01/02 0842) BP: 120/84 (01/02 0842) Pulse Rate: 72 (01/02 0842)  Labs:  Recent Labs  10/16/16 1749 10/16/16 2203 10/16/16 2328 10/17/16 0246  HGB 16.5  --   --  16.3  HCT 46.2  --   --  45.4  PLT 138*  --   --  176  LABPROT 13.4  --   --   --   INR 1.02  --   --   --   HEPARINUNFRC  --   --  0.39 0.31  CREATININE  --  0.87  --  0.73    Estimated Creatinine Clearance: 121.3 mL/min (by C-G formula based on SCr of 0.73 mg/dL).   Assessment: 51 year old male with history of paroxysmal atrial fibrillation treated with Xarelto for 6 months after ablation in November 2014 but because CHADS2VASC was thought to be 0 was taken off anticoagulation. Now admitted with recurrent atrial fibrillation to start IV heparin therapy per pharmacy dosing.   Heparin level therapeutic (0.31) on gtt at 1400 units/hr. No bleeding noted.  Goal of Therapy:  Heparin level 0.3-0.7 units/ml Monitor platelets by anticoagulation protocol: Yes   Plan:  Continue heparin at 1400 units/hr F/u daily heparin level to confirm therapeutic  Sheppard CoilFrank Icis Budreau PharmD., BCPS Clinical Pharmacist Pager [phone] 10/17/2016 9:19 AM

## 2016-10-17 NOTE — Progress Notes (Addendum)
Diltiazem gtt stopped at 10pm d/t freq. pauses, pts HR fluctuating more now, a fib 100s-160s. Pt is sleeping in bed, asymptomatic. Cardioversion in AM, text page to cards fellow to see if he wants dilt gtt restarted or any other intervention needed. No further orders at this time, will continue to monitor.

## 2016-10-23 ENCOUNTER — Ambulatory Visit (INDEPENDENT_AMBULATORY_CARE_PROVIDER_SITE_OTHER): Payer: BLUE CROSS/BLUE SHIELD | Admitting: Internal Medicine

## 2016-10-23 ENCOUNTER — Encounter (INDEPENDENT_AMBULATORY_CARE_PROVIDER_SITE_OTHER): Payer: Self-pay

## 2016-10-23 VITALS — BP 150/96 | HR 73 | Ht 72.0 in | Wt 211.4 lb

## 2016-10-23 DIAGNOSIS — I480 Paroxysmal atrial fibrillation: Secondary | ICD-10-CM

## 2016-10-23 DIAGNOSIS — F17200 Nicotine dependence, unspecified, uncomplicated: Secondary | ICD-10-CM

## 2016-10-23 DIAGNOSIS — I10 Essential (primary) hypertension: Secondary | ICD-10-CM | POA: Diagnosis not present

## 2016-10-23 MED ORDER — AMLODIPINE BESYLATE 10 MG PO TABS: 10.0000 mg | ORAL_TABLET | Freq: Every day | ORAL | 3 refills | Status: DC

## 2016-10-23 MED ORDER — RIVAROXABAN 20 MG PO TABS: 20.0000 mg | ORAL_TABLET | Freq: Every day | ORAL | 11 refills | Status: DC

## 2016-10-23 NOTE — Progress Notes (Signed)
PCP: Raquel Sarnaonald W Lee, MD Primary Cardiologist:   Dr SwazilandJordan  Alister D Theiss is a 51 y.o. male who presents today for routine electrophysiology followup.  He was recently hospitalized with recurrent afib. He had post termination pause with presyncope.  He has had several additional episodes of afib including this afib.  He feels poorly upon conversion but has not fully passed out.  He is very anxious with the return of his afib.  Today, he denies symptoms of shortness of breath,  lower extremity edema, presyncope, or syncope.  The patient is otherwise without complaint today.   Past Medical History  Diagnosis Date  . Anxiety   . Paroxysmal atrial fibrillation     a. previously failed flecainide/dilt/lopressor;  b. 08/2013 TEE: EF 55-60%, no rwma, no LAA thrombus or veg;  c. s/p PVI and afib ablation 08/2013;  d. chronic xarelto.  . Chest pain     a. 04/2010 Cath: essentially nl cors, nl EF.  Marland Kitchen. Syncope     a. 09/2013 adm - not clear if related to bradycardia. Suspected orthostasis.  . Bradycardia     a. 09/2013 adm - pindolol discontinued.   Past Surgical History  Procedure Laterality Date  . Appendectomy    . Cholecystectomy    . Hernia repair    . Cardiac catheterization      [facility], Pt reports no CAD  . Tee without cardioversion N/A 08/18/2013    Procedure: TRANSESOPHAGEAL ECHOCARDIOGRAM (TEE);  Surgeon: Lewayne BuntingBrian S Crenshaw, MD;  [facility];  Service: Cardiovascular;  Laterality: N/A;  . Ablation  08/19/2013    PVI by Dr Johney FrameAllred  . Atrial fibrillation ablation N/A 08/19/2013    Procedure: ATRIAL FIBRILLATION ABLATION;  Surgeon: Gardiner RhymeJames D Hartleigh Edmonston, MD;  Location: [facility] CATH LAB;  Service: Cardiovascular;  Laterality: N/A;    Current Outpatient Prescriptions:  .  ALPRAZolam (XANAX) 0.5 MG tablet, Take 0.5 mg by mouth 3 (three) times daily as needed for anxiety. , Disp: , Rfl:  .  amLODipine (NORVASC) 5 MG tablet, Take 5 mg by mouth daily., Disp: , Rfl:  .  esomeprazole  (NEXIUM) 20 MG capsule, Take 20 mg by mouth daily at 12 noon., Disp: , Rfl:  .  HYDROcodone-acetaminophen (NORCO/VICODIN) 5-325 MG per tablet, Take 1 tablet by mouth every 6 (six) hours as needed for moderate pain., Disp: , Rfl:  .  nitroGLYCERIN (NITROSTAT) 0.4 MG SL tablet, Place 1 tablet (0.4 mg total) under the tongue every 5 (five) minutes as needed for chest pain., Disp: 90 tablet, Rfl: 3 .  temazepam (RESTORIL) 15 MG capsule, Take 15 mg by mouth at bedtime., Disp: , Rfl:     Physical Exam: Vitals:   10/23/16 1435  BP: (!) 150/96  Pulse: 73   GEN- The patient is well appearing, alert and oriented x 3 today.   Head- normocephalic, atraumatic Eyes-  Sclera clear, conjunctiva pink Ears- hearing intact Oropharynx- clear Lungs- Clear to ausculation bilaterally, normal work of breathing Heart- Regular rate and rhythm, no murmurs, rubs or gallops, PMI not laterally displaced GI- soft, NT, ND, + BS Extremities- no clubbing, cyanosis, or edema  ekg today reveals sinus rhythm, T wave inversions are nonspecific and similar to 06/16/2015  Assessment and Plan:  1. Bradycardia Resolved Will need to follow closely for post termination pauses with recurrence of his afib  2. Paroxysmal afib Recurrent afib post ablation Therapeutic strategies for afib including medicine and repeat ablation were discussed in detail with  the patient today. Risk, benefits, and alternatives to EP study and radiofrequency ablation for afib were also discussed in detail today. These risks include but are not limited to stroke, bleeding, vascular damage, tamponade, perforation, damage to the esophagus, lungs, and other structures, pulmonary vein stenosis, worsening renal function, and death. The patient understands these risk and wishes to proceed.  We will therefore proceed with catheter ablation once the patient has been adequately anticoagulated.  Start xarelto 20mg  daily today Echo to evaluate for structural  changes Cardiac CT prior to ablation to exclude LAA thromus Prn cardizem advised  3. tobaccco Cessation advised  4. HTN BP elevated Will increase norvasc to 10mg  daily  He will contact my office should any problems arise in the interim.  Hillis Range MD, [facility] 10/23/2016 3:14 PM

## 2016-10-23 NOTE — Patient Instructions (Addendum)
Medication Instructions:  Your physician has recommended you make the following change in your medication:  1) Increase Norvasc to 10 mg daily 2) Start Xarelto 20 mg daily   Labwork: Your physician recommends that you return for lab work 10/30/16---you do not have to fast. Will try and get echo same day    Testing/Procedures:  Your physician has requested that you have an echocardiogram. Echocardiography is a painless test that uses sound waves to create images of your heart. It provides your doctor with information about the size and shape of your heart and how well your heart's chambers and valves are working. This procedure takes approximately one hour. There are no restrictions for this procedure.    Your physician has requested that you have cardiac CT. Cardiac computed tomography (CT) is a painless test that uses an x-ray machine to take clear, detailed pictures of your heart. For further information please visit [email or web address]. Please follow instruction sheet as given.---week of 11/06/16, office will call with time and instructions once scheduled  Your physician has recommended that you have an ablation. Catheter ablation is a medical procedure used to treat some cardiac arrhythmias (irregular heartbeats). During catheter ablation, a long, thin, flexible tube is put into a blood vessel in your groin (upper thigh), or neck. This tube is called an ablation catheter. It is then guided to your heart through the blood vessel. Radio frequency waves destroy small areas of heart tissue where abnormal heartbeats may cause an arrhythmia to start. Please see the instruction sheet given to you today.---11/14/16  [facility] at 8:30am Do not eat or drink after midnight the night prior to your procedure Roe CoombsDon not take any medications the morning of the procedure Plan for one night stay and will need someone to drive you have at  discharge   Follow-Up: Your physician recommends that you schedule a follow-up appointment in: 4 weeks from 11/14/16 with Rudi Cocoonna Carroll, NP in afib clinic and 3 months from 11/14/16 with Dr Johney FrameAllred

## 2016-10-24 ENCOUNTER — Telehealth (HOSPITAL_COMMUNITY): Payer: Self-pay | Admitting: Internal Medicine

## 2016-10-24 DIAGNOSIS — I10 Essential (primary) hypertension: Secondary | ICD-10-CM | POA: Diagnosis not present

## 2016-10-24 DIAGNOSIS — K219 Gastro-esophageal reflux disease without esophagitis: Secondary | ICD-10-CM | POA: Diagnosis not present

## 2016-10-24 DIAGNOSIS — M545 Low back pain: Secondary | ICD-10-CM | POA: Diagnosis not present

## 2016-10-24 DIAGNOSIS — I480 Paroxysmal atrial fibrillation: Secondary | ICD-10-CM | POA: Diagnosis not present

## 2016-10-24 NOTE — Telephone Encounter (Signed)
Tried calling the patient 2 times this morning on *[phone]* number and received no answer due to there not being an answering machine. So I then called his wife's number (the went straight to vm) and left a message notifying her that her husband had been mistakenly double booked on 1/11 at 9:30 and I rescheduled him to an 8:30 slot on 1/10. I asked that he or she call back to confirm that the message had been received.

## 2016-10-25 ENCOUNTER — Other Ambulatory Visit: Payer: Self-pay

## 2016-10-25 ENCOUNTER — Ambulatory Visit (HOSPITAL_COMMUNITY): Payer: BLUE CROSS/BLUE SHIELD | Attending: Cardiovascular Disease

## 2016-10-25 DIAGNOSIS — I371 Nonrheumatic pulmonary valve insufficiency: Secondary | ICD-10-CM | POA: Insufficient documentation

## 2016-10-25 DIAGNOSIS — R001 Bradycardia, unspecified: Secondary | ICD-10-CM | POA: Diagnosis not present

## 2016-10-25 DIAGNOSIS — I480 Paroxysmal atrial fibrillation: Secondary | ICD-10-CM | POA: Diagnosis present

## 2016-10-25 DIAGNOSIS — F419 Anxiety disorder, unspecified: Secondary | ICD-10-CM | POA: Insufficient documentation

## 2016-10-26 ENCOUNTER — Other Ambulatory Visit (HOSPITAL_COMMUNITY): Payer: BLUE CROSS/BLUE SHIELD

## 2016-10-27 ENCOUNTER — Encounter: Payer: Self-pay | Admitting: Internal Medicine

## 2016-10-30 ENCOUNTER — Other Ambulatory Visit (INDEPENDENT_AMBULATORY_CARE_PROVIDER_SITE_OTHER): Payer: BLUE CROSS/BLUE SHIELD

## 2016-10-30 DIAGNOSIS — I480 Paroxysmal atrial fibrillation: Secondary | ICD-10-CM

## 2016-10-31 LAB — BASIC METABOLIC PANEL
BUN / CREAT RATIO: 14 (ref 9–20)
BUN: 12 mg/dL (ref 6–24)
CALCIUM: 9.2 mg/dL (ref 8.7–10.2)
CHLORIDE: 103 mmol/L (ref 96–106)
CO2: 23 mmol/L (ref 18–29)
Creatinine, Ser: 0.87 mg/dL (ref 0.76–1.27)
GFR calc non Af Amer: 101 mL/min/{1.73_m2} (ref >59)
GFR, EST AFRICAN AMERICAN: 116 mL/min/{1.73_m2} (ref >59)
Glucose: 76 mg/dL (ref 65–99)
Potassium: 4 mmol/L (ref 3.5–5.2)
Sodium: 142 mmol/L (ref 134–144)

## 2016-10-31 LAB — CBC WITH DIFFERENTIAL/PLATELET
BASOS ABS: 0 10*3/uL (ref 0.0–0.2)
Basos: 0 %
EOS (ABSOLUTE): 0.4 10*3/uL (ref 0.0–0.4)
Eos: 5 %
HEMOGLOBIN: 15.9 g/dL (ref 13.0–17.7)
Hematocrit: 45.5 % (ref 37.5–51.0)
Immature Grans (Abs): 0 10*3/uL (ref 0.0–0.1)
Immature Granulocytes: 0 %
LYMPHS ABS: 2 10*3/uL (ref 0.7–3.1)
Lymphs: 25 %
MCH: 31.8 pg (ref 26.6–33.0)
MCHC: 34.9 g/dL (ref 31.5–35.7)
MCV: 91 fL (ref 79–97)
MONOCYTES: 7 %
Monocytes Absolute: 0.6 10*3/uL (ref 0.1–0.9)
NEUTROS ABS: 5 10*3/uL (ref 1.4–7.0)
Neutrophils: 63 %
PLATELETS: 220 10*3/uL (ref 150–379)
RBC: 5 x10E6/uL (ref 4.14–5.80)
RDW: 13 % (ref 12.3–15.4)
WBC: 7.9 10*3/uL (ref 3.4–10.8)

## 2016-11-09 ENCOUNTER — Ambulatory Visit (HOSPITAL_COMMUNITY)
Admission: RE | Admit: 2016-11-09 | Discharge: 2016-11-09 | Disposition: A | Discharge status: Home / Self Care | Payer: BLUE CROSS/BLUE SHIELD | Source: Ambulatory Visit | Attending: Internal Medicine | Admitting: Internal Medicine

## 2016-11-09 ENCOUNTER — Encounter (HOSPITAL_COMMUNITY): Payer: Self-pay

## 2016-11-09 ENCOUNTER — Ambulatory Visit (HOSPITAL_COMMUNITY): Admission: RE | Admit: 2016-11-09 | Payer: BLUE CROSS/BLUE SHIELD | Source: Ambulatory Visit

## 2016-11-09 DIAGNOSIS — I4891 Unspecified atrial fibrillation: Secondary | ICD-10-CM

## 2016-11-09 DIAGNOSIS — I480 Paroxysmal atrial fibrillation: Secondary | ICD-10-CM | POA: Insufficient documentation

## 2016-11-09 MED ORDER — IOPAMIDOL (ISOVUE-370) INJECTION 76%
INTRAVENOUS | Status: AC
  Administered 2016-11-09: 80 mL
  Filled 2016-11-09: qty 100

## 2016-11-14 ENCOUNTER — Ambulatory Visit (HOSPITAL_COMMUNITY): Payer: BLUE CROSS/BLUE SHIELD | Admitting: Certified Registered Nurse Anesthetist

## 2016-11-14 ENCOUNTER — Encounter (HOSPITAL_COMMUNITY)
Admission: RE | Disposition: A | Discharge status: Home / Self Care | Payer: Self-pay | Source: Ambulatory Visit | Attending: Internal Medicine

## 2016-11-14 ENCOUNTER — Ambulatory Visit (HOSPITAL_COMMUNITY)
Admission: RE | Admit: 2016-11-14 | Discharge: 2016-11-15 | Disposition: A | Discharge status: Home / Self Care | Payer: BLUE CROSS/BLUE SHIELD | Source: Ambulatory Visit | Attending: Internal Medicine | Admitting: Internal Medicine

## 2016-11-14 ENCOUNTER — Encounter (HOSPITAL_COMMUNITY): Payer: Self-pay | Admitting: Certified Registered Nurse Anesthetist

## 2016-11-14 DIAGNOSIS — Z7901 Long term (current) use of anticoagulants: Secondary | ICD-10-CM | POA: Diagnosis not present

## 2016-11-14 DIAGNOSIS — I10 Essential (primary) hypertension: Secondary | ICD-10-CM | POA: Insufficient documentation

## 2016-11-14 DIAGNOSIS — I739 Peripheral vascular disease, unspecified: Secondary | ICD-10-CM | POA: Diagnosis not present

## 2016-11-14 DIAGNOSIS — F17210 Nicotine dependence, cigarettes, uncomplicated: Secondary | ICD-10-CM | POA: Diagnosis not present

## 2016-11-14 DIAGNOSIS — I480 Paroxysmal atrial fibrillation: Secondary | ICD-10-CM | POA: Diagnosis not present

## 2016-11-14 DIAGNOSIS — I4891 Unspecified atrial fibrillation: Secondary | ICD-10-CM

## 2016-11-14 DIAGNOSIS — F419 Anxiety disorder, unspecified: Secondary | ICD-10-CM | POA: Insufficient documentation

## 2016-11-14 HISTORY — PX: ELECTROPHYSIOLOGIC STUDY: SHX172A

## 2016-11-14 HISTORY — DX: Unspecified atrial fibrillation: I48.91

## 2016-11-14 LAB — POCT ACTIVATED CLOTTING TIME
ACTIVATED CLOTTING TIME: 252 s
ACTIVATED CLOTTING TIME: 307 s
ACTIVATED CLOTTING TIME: 175 s

## 2016-11-14 SURGERY — ATRIAL FIBRILLATION ABLATION
Anesthesia: General

## 2016-11-14 MED ORDER — HEPARIN SODIUM (PORCINE) 1000 UNIT/ML IJ SOLN
INTRAMUSCULAR | Status: DC | PRN
  Administered 2016-11-14: 2000 [IU] via INTRAVENOUS
  Administered 2016-11-14: 5000 [IU] via INTRAVENOUS
  Administered 2016-11-14: 12000 [IU] via INTRAVENOUS

## 2016-11-14 MED ORDER — HYDROCODONE-ACETAMINOPHEN 5-325 MG PO TABS
ORAL_TABLET | ORAL | Status: AC
  Filled 2016-11-14: qty 1

## 2016-11-14 MED ORDER — IOPAMIDOL (ISOVUE-370) INJECTION 76%
INTRAVENOUS | Status: DC | PRN
  Administered 2016-11-14: 3 mL via INTRAVENOUS

## 2016-11-14 MED ORDER — ADENOSINE 6 MG/2ML IV SOLN
INTRAVENOUS | Status: AC
  Filled 2016-11-14: qty 2

## 2016-11-14 MED ORDER — ALPRAZOLAM 0.25 MG PO TABS
0.5000 mg | ORAL_TABLET | Freq: Once | ORAL | Status: AC
  Administered 2016-11-14: 0.5 mg via ORAL

## 2016-11-14 MED ORDER — AMLODIPINE BESYLATE 10 MG PO TABS
10.0000 mg | ORAL_TABLET | Freq: Every day | ORAL | Status: DC
  Administered 2016-11-15: 09:00:00 10 mg via ORAL
  Filled 2016-11-14: qty 1

## 2016-11-14 MED ORDER — ISOPROTERENOL HCL 0.2 MG/ML IJ SOLN
INTRAMUSCULAR | Status: AC
  Filled 2016-11-14: qty 5

## 2016-11-14 MED ORDER — ADENOSINE 6 MG/2ML IV SOLN
INTRAVENOUS | Status: DC | PRN
  Administered 2016-11-14 (×2): 12 mg via INTRAVENOUS

## 2016-11-14 MED ORDER — SODIUM CHLORIDE 0.9% FLUSH: 3.0000 mL | INTRAVENOUS | Status: DC | PRN

## 2016-11-14 MED ORDER — FENTANYL CITRATE (PF) 100 MCG/2ML IJ SOLN
INTRAMUSCULAR | Status: AC
  Filled 2016-11-14: qty 2

## 2016-11-14 MED ORDER — ALPRAZOLAM 0.5 MG PO TABS
0.5000 mg | ORAL_TABLET | Freq: Three times a day (TID) | ORAL | Status: DC | PRN
  Administered 2016-11-15: 05:00:00 0.5 mg via ORAL
  Filled 2016-11-14: qty 1

## 2016-11-14 MED ORDER — MIDAZOLAM HCL 2 MG/2ML IJ SOLN
INTRAMUSCULAR | Status: DC | PRN
  Administered 2016-11-14: 2 mg via INTRAVENOUS

## 2016-11-14 MED ORDER — SODIUM CHLORIDE 0.9 % IV SOLN: 250.0000 mL | INTRAVENOUS | Status: DC | PRN

## 2016-11-14 MED ORDER — SODIUM CHLORIDE 0.9 % IV SOLN
INTRAVENOUS | Status: DC
  Administered 2016-11-14: 09:00:00 via INTRAVENOUS

## 2016-11-14 MED ORDER — OFF THE BEAT BOOK
Freq: Once | Status: AC
  Administered 2016-11-14: 21:00:00
  Filled 2016-11-14: qty 1

## 2016-11-14 MED ORDER — FENTANYL CITRATE (PF) 100 MCG/2ML IJ SOLN: 25.0000 ug | Freq: Once | INTRAMUSCULAR | Status: DC

## 2016-11-14 MED ORDER — LIDOCAINE HCL (CARDIAC) 20 MG/ML IV SOLN
INTRAVENOUS | Status: DC | PRN
  Administered 2016-11-14: 80 mg via INTRAVENOUS

## 2016-11-14 MED ORDER — IOPAMIDOL (ISOVUE-370) INJECTION 76%
INTRAVENOUS | Status: AC
  Filled 2016-11-14: qty 50

## 2016-11-14 MED ORDER — ONDANSETRON HCL 4 MG/2ML IJ SOLN
INTRAMUSCULAR | Status: DC | PRN
  Administered 2016-11-14: 4 mg via INTRAVENOUS

## 2016-11-14 MED ORDER — ACETAMINOPHEN 325 MG PO TABS: 650.0000 mg | ORAL_TABLET | ORAL | Status: DC | PRN

## 2016-11-14 MED ORDER — BUPIVACAINE HCL (PF) 0.25 % IJ SOLN
INTRAMUSCULAR | Status: AC
  Filled 2016-11-14: qty 30

## 2016-11-14 MED ORDER — FENTANYL CITRATE (PF) 100 MCG/2ML IJ SOLN
25.0000 ug | Freq: Once | INTRAMUSCULAR | Status: AC
  Administered 2016-11-14: 25 ug via INTRAVENOUS

## 2016-11-14 MED ORDER — SODIUM CHLORIDE 0.9% FLUSH: 3.0000 mL | Freq: Two times a day (BID) | INTRAVENOUS | Status: DC

## 2016-11-14 MED ORDER — HYDROCODONE-ACETAMINOPHEN 5-325 MG PO TABS
1.0000 | ORAL_TABLET | Freq: Four times a day (QID) | ORAL | Status: DC | PRN
  Administered 2016-11-14 (×2): 1 via ORAL
  Filled 2016-11-14: qty 1

## 2016-11-14 MED ORDER — BUPIVACAINE HCL (PF) 0.25 % IJ SOLN
INTRAMUSCULAR | Status: DC | PRN
  Administered 2016-11-14: 10 mL

## 2016-11-14 MED ORDER — FENTANYL CITRATE (PF) 100 MCG/2ML IJ SOLN
INTRAMUSCULAR | Status: DC | PRN
  Administered 2016-11-14: 50 ug via INTRAVENOUS
  Administered 2016-11-14: 25 ug via INTRAVENOUS
  Administered 2016-11-14: 50 ug via INTRAVENOUS
  Administered 2016-11-14: 25 ug via INTRAVENOUS
  Administered 2016-11-14: 50 ug via INTRAVENOUS

## 2016-11-14 MED ORDER — PROPOFOL 10 MG/ML IV BOLUS
INTRAVENOUS | Status: DC | PRN
  Administered 2016-11-14: 200 mg via INTRAVENOUS

## 2016-11-14 MED ORDER — FENTANYL CITRATE (PF) 100 MCG/2ML IJ SOLN
25.0000 ug | Freq: Once | INTRAMUSCULAR | Status: AC | PRN
  Administered 2016-11-14: 25 ug via INTRAVENOUS

## 2016-11-14 MED ORDER — RIVAROXABAN 20 MG PO TABS
20.0000 mg | ORAL_TABLET | Freq: Every day | ORAL | Status: DC
  Administered 2016-11-14: 18:00:00 20 mg via ORAL
  Filled 2016-11-14: qty 1

## 2016-11-14 MED ORDER — TEMAZEPAM 15 MG PO CAPS
15.0000 mg | ORAL_CAPSULE | Freq: Every day | ORAL | Status: DC
  Administered 2016-11-14: 15 mg via ORAL
  Filled 2016-11-14: qty 1

## 2016-11-14 MED ORDER — ONDANSETRON HCL 4 MG/2ML IJ SOLN: 4.0000 mg | Freq: Four times a day (QID) | INTRAMUSCULAR | Status: DC | PRN

## 2016-11-14 MED ORDER — HEPARIN SODIUM (PORCINE) 1000 UNIT/ML IJ SOLN
INTRAMUSCULAR | Status: AC
  Filled 2016-11-14: qty 1

## 2016-11-14 MED ORDER — ALPRAZOLAM 0.25 MG PO TABS
ORAL_TABLET | ORAL | Status: AC
  Filled 2016-11-14: qty 2

## 2016-11-14 MED ORDER — ISOPROTERENOL HCL 0.2 MG/ML IJ SOLN
INTRAVENOUS | Status: DC | PRN
  Administered 2016-11-14: 20 ug/min via INTRAVENOUS

## 2016-11-14 MED ORDER — PROTAMINE SULFATE 10 MG/ML IV SOLN
INTRAVENOUS | Status: DC | PRN
  Administered 2016-11-14: 30 mg via INTRAVENOUS

## 2016-11-14 SURGICAL SUPPLY — 20 items
BAG SNAP BAND KOVER 36X36 (MISCELLANEOUS) ×2 IMPLANT
BLANKET WARM UNDERBOD FULL ACC (MISCELLANEOUS) ×2 IMPLANT
CATH NAVISTAR SMARTTOUCH DF (ABLATOR) ×2 IMPLANT
CATH SOUNDSTAR 3D IMAGING (CATHETERS) ×2 IMPLANT
CATH VARIABLE LASSO NAV 2515 (CATHETERS) ×2 IMPLANT
CATH WEBSTER BI DIR CS D-F CRV (CATHETERS) ×2 IMPLANT
COVER SWIFTLINK CONNECTOR (BAG) ×2 IMPLANT
FEM STOP ARCH (HEMOSTASIS) ×1
NEEDLE TRANSEP BRK 71CM 407200 (NEEDLE) ×2 IMPLANT
PACK EP LATEX FREE (CUSTOM PROCEDURE TRAY) ×1
PACK EP LF (CUSTOM PROCEDURE TRAY) ×1 IMPLANT
PAD DEFIB LIFELINK (PAD) ×2 IMPLANT
PATCH CARTO3 (PAD) ×2 IMPLANT
SHEATH AVANTI 11F 11CM (SHEATH) ×2 IMPLANT
SHEATH PINNACLE 7F 10CM (SHEATH) ×4 IMPLANT
SHEATH PINNACLE 9F 10CM (SHEATH) ×2 IMPLANT
SHEATH SWARTZ TS SL2 63CM 8.5F (SHEATH) ×2 IMPLANT
SHIELD RADPAD SCOOP 12X17 (MISCELLANEOUS) ×2 IMPLANT
SYSTEM COMPRESSION FEMOSTOP (HEMOSTASIS) ×1 IMPLANT
TUBING SMART ABLATE COOLFLOW (TUBING) ×2 IMPLANT

## 2016-11-14 NOTE — Transfer of Care (Signed)
Immediate Anesthesia Transfer of Care Note  Patient: Wayne Pollard  Procedure(s) Performed: Procedure(s): Atrial Fibrillation Ablation (N/A)  Patient Location: PACU  Anesthesia Type:General  Level of Consciousness: awake, alert  and oriented  Airway & Oxygen Therapy: Patient Spontanous Breathing and Patient connected to nasal cannula oxygen  Post-op Assessment: Report given to RN, Post -op Vital signs reviewed and stable and Patient moving all extremities X 4  Post vital signs: Reviewed and stable  Last Vitals:  Vitals:   11/14/16 0826  BP: (!) 141/95  Pulse: 71  Resp: 18  Temp: 36.4 C    Last Pain:  Vitals:   11/14/16 0826  TempSrc: Oral      Patients Stated Pain Goal: 3 (11/14/16 0850)  Complications: No apparent anesthesia complications

## 2016-11-14 NOTE — H&P (View-Only) (Signed)
PCP: Raquel Sarnaonald W Lee, MD Primary Cardiologist:   Dr SwazilandJordan  Wayne Pollard is a 51 y.o. male who presents today for routine electrophysiology followup.  He was recently hospitalized with recurrent afib. He had post termination pause with presyncope.  He has had several additional episodes of afib including this afib.  He feels poorly upon conversion but has not fully passed out.  He is very anxious with the return of his afib.  Today, he denies symptoms of shortness of breath,  lower extremity edema, presyncope, or syncope.  The patient is otherwise without complaint today.   Past Medical History  Diagnosis Date  . Anxiety   . Paroxysmal atrial fibrillation     a. previously failed flecainide/dilt/lopressor;  b. 08/2013 TEE: EF 55-60%, no rwma, no LAA thrombus or veg;  c. s/p PVI and afib ablation 08/2013;  d. chronic xarelto.  . Chest pain     a. 04/2010 Cath: essentially nl cors, nl EF.  Marland Kitchen. Syncope     a. 09/2013 adm - not clear if related to bradycardia. Suspected orthostasis.  . Bradycardia     a. 09/2013 adm - pindolol discontinued.   Past Surgical History  Procedure Laterality Date  . Appendectomy    . Cholecystectomy    . Hernia repair    . Cardiac catheterization      [facility], Pt reports no CAD  . Tee without cardioversion N/A 08/18/2013    Procedure: TRANSESOPHAGEAL ECHOCARDIOGRAM (TEE);  Surgeon: Lewayne BuntingBrian S Crenshaw, MD;  [facility];  Service: Cardiovascular;  Laterality: N/A;  . Ablation  08/19/2013    PVI by Dr Johney FrameAllred  . Atrial fibrillation ablation N/A 08/19/2013    Procedure: ATRIAL FIBRILLATION ABLATION;  Surgeon: Gardiner RhymeJames D Boone Gear, MD;  Location: [facility] CATH LAB;  Service: Cardiovascular;  Laterality: N/A;    Current Outpatient Prescriptions:  .  ALPRAZolam (XANAX) 0.5 MG tablet, Take 0.5 mg by mouth 3 (three) times daily as needed for anxiety. , Disp: , Rfl:  .  amLODipine (NORVASC) 5 MG tablet, Take 5 mg by mouth daily., Disp: , Rfl:  .  esomeprazole  (NEXIUM) 20 MG capsule, Take 20 mg by mouth daily at 12 noon., Disp: , Rfl:  .  HYDROcodone-acetaminophen (NORCO/VICODIN) 5-325 MG per tablet, Take 1 tablet by mouth every 6 (six) hours as needed for moderate pain., Disp: , Rfl:  .  nitroGLYCERIN (NITROSTAT) 0.4 MG SL tablet, Place 1 tablet (0.4 mg total) under the tongue every 5 (five) minutes as needed for chest pain., Disp: 90 tablet, Rfl: 3 .  temazepam (RESTORIL) 15 MG capsule, Take 15 mg by mouth at bedtime., Disp: , Rfl:     Physical Exam: Vitals:   10/23/16 1435  BP: (!) 150/96  Pulse: 73   GEN- The patient is well appearing, alert and oriented x 3 today.   Head- normocephalic, atraumatic Eyes-  Sclera clear, conjunctiva pink Ears- hearing intact Oropharynx- clear Lungs- Clear to ausculation bilaterally, normal work of breathing Heart- Regular rate and rhythm, no murmurs, rubs or gallops, PMI not laterally displaced GI- soft, NT, ND, + BS Extremities- no clubbing, cyanosis, or edema  ekg today reveals sinus rhythm, T wave inversions are nonspecific and similar to 06/16/2015  Assessment and Plan:  1. Bradycardia Resolved Will need to follow closely for post termination pauses with recurrence of his afib  2. Paroxysmal afib Recurrent afib post ablation Therapeutic strategies for afib including medicine and repeat ablation were discussed in detail with  the patient today. Risk, benefits, and alternatives to EP study and radiofrequency ablation for afib were also discussed in detail today. These risks include but are not limited to stroke, bleeding, vascular damage, tamponade, perforation, damage to the esophagus, lungs, and other structures, pulmonary vein stenosis, worsening renal function, and death. The patient understands these risk and wishes to proceed.  We will therefore proceed with catheter ablation once the patient has been adequately anticoagulated.  Start xarelto 20mg  daily today Echo to evaluate for structural  changes Cardiac CT prior to ablation to exclude LAA thromus Prn cardizem advised  3. tobaccco Cessation advised  4. HTN BP elevated Will increase norvasc to 10mg  daily  He will contact my office should any problems arise in the interim.  Hillis Range MD, [facility] 10/23/2016 3:14 PM

## 2016-11-14 NOTE — Interval H&P Note (Signed)
History and Physical Interval Note:  11/14/2016 10:29 AM  Wayne Pollard  has presented today for surgery, with the diagnosis of afib  The various methods of treatment have been discussed with the patient and family. After consideration of risks, benefits and other options for treatment, the patient has consented to  Procedure(s): Atrial Fibrillation Ablation (N/A) as a surgical intervention .  The patient's history has been reviewed, patient examined, no change in status, stable for surgery.  I have reviewed the patient's chart and labs.  Questions were answered to the patient's satisfaction.     Hillis RangeJames Meggen Spaziani

## 2016-11-14 NOTE — Anesthesia Procedure Notes (Signed)
Procedure Name: LMA Insertion Date/Time: 11/14/2016 11:05 AM Performed by: Little IshikawaMERCER, Ananya Mccleese L Pre-anesthesia Checklist: Patient identified, Emergency Drugs available, Suction available, Patient being monitored and Timeout performed Patient Re-evaluated:Patient Re-evaluated prior to inductionOxygen Delivery Method: Circle system utilized Preoxygenation: Pre-oxygenation with 100% oxygen Intubation Type: IV induction LMA: LMA inserted LMA Size: 5.0 Number of attempts: 2 Placement Confirmation: positive ETCO2 and breath sounds checked- equal and bilateral Tube secured with: Tape Dental Injury: Teeth and Oropharynx as per pre-operative assessment

## 2016-11-14 NOTE — Progress Notes (Signed)
Pt re-bleed and manual pressure held again for 40 min.  Pt cough and right groin venous site re-bleed.  Dr Johney FrameAllred in to see pt and Femstop was put on at 1330mm/Hg by Virl DiamondBrian Robinson RCIS .  Fentanyl 25mg  IV given for discomfort at site.  Groin site a level 0, no brusing, tender to touch.

## 2016-11-14 NOTE — Anesthesia Preprocedure Evaluation (Addendum)
Anesthesia Evaluation  Patient identified by MRN, date of birth, ID band Patient awake    Reviewed: Allergy & Precautions, NPO status , Patient's Chart, lab work & pertinent test results  Airway Mallampati: II  TM Distance: >3 FB Neck ROM: Full    Dental  (+) Dental Advisory Given   Pulmonary Current Smoker,    breath sounds clear to auscultation       Cardiovascular hypertension, Pt. on medications + Peripheral Vascular Disease  + dysrhythmias Atrial Fibrillation  Rhythm:Regular Rate:Normal  Study Conclusions  - Left ventricle: The cavity size was normal. Wall thickness was  increased in a pattern of mild LVH. Systolic function was normal.  The estimated ejection fraction was in the range of 60% to 65%.  Wall motion was normal; there were no regional wall motion abnormalities. Features are consistent with a pseudonormal left  ventricular filling pattern, with concomitant abnormal relaxation  and increased filling pressure (grade 2 diastolic dysfunction). - Aortic valve: Moderately thickened, moderately calcified   leaflets; fusion of the right-left coronary commissure. Mean gradient of 15mmHg   Neuro/Psych Anxiety negative neurological ROS     GI/Hepatic negative GI ROS, Neg liver ROS,   Endo/Other  negative endocrine ROS  Renal/GU negative Renal ROS     Musculoskeletal   Abdominal   Peds  Hematology negative hematology ROS (+)   Anesthesia Other Findings   Reproductive/Obstetrics                            Lab Results  Component Value Date   WBC 7.9 10/30/2016   HGB 16.3 10/17/2016   HCT 45.5 10/30/2016   MCV 91 10/30/2016   PLT 220 10/30/2016   Lab Results  Component Value Date   CREATININE 0.87 10/30/2016   BUN 12 10/30/2016   NA 142 10/30/2016   K 4.0 10/30/2016   CL 103 10/30/2016   CO2 23 10/30/2016    Anesthesia Physical Anesthesia Plan  ASA: III  Anesthesia Plan:  General   Post-op Pain Management:    Induction: Intravenous  Airway Management Planned: Oral ETT  Additional Equipment:   Intra-op Plan:   Post-operative Plan: Extubation in OR  Informed Consent: I have reviewed the patients History and Physical, chart, labs and discussed the procedure including the risks, benefits and alternatives for the proposed anesthesia with the patient or authorized representative who has indicated his/her understanding and acceptance.   Dental advisory given  Plan Discussed with: CRNA  Anesthesia Plan Comments:         Anesthesia Quick Evaluation

## 2016-11-14 NOTE — Anesthesia Postprocedure Evaluation (Signed)
Anesthesia Post Note  Patient: Waverly Ferrariimothy D Acevedo  Procedure(s) Performed: Procedure(s) (LRB): Atrial Fibrillation Ablation (N/A)  Patient location during evaluation: PACU Anesthesia Type: General Level of consciousness: awake and alert Pain management: pain level controlled Vital Signs Assessment: post-procedure vital signs reviewed and stable Respiratory status: spontaneous breathing, nonlabored ventilation, respiratory function stable and patient connected to nasal cannula oxygen Cardiovascular status: blood pressure returned to baseline and stable Postop Assessment: no signs of nausea or vomiting Anesthetic complications: no       Last Vitals:  Vitals:   11/14/16 1430 11/14/16 1435  BP:  123/82  Pulse: 74 73  Resp: 19 18  Temp:      Last Pain:  Vitals:   11/14/16 1435  TempSrc:   PainSc: 2                  Kennieth RadFitzgerald, Chistopher Mangino E

## 2016-11-14 NOTE — Progress Notes (Signed)
Site area: Right groin   Site Prior to Removal: level 1, steady oozing, no hematoma`  Pressure Applied For 20 MINUTES   Minutes Beginning at 1350  Manual: Yes   Patient Status During Pull: Alert and oriented, tender at holding site.  Post Pull Groin Site: level 0  Post Pull Instructions Given:Yes, verbalized understanding   Post Pull Pulses Present: 2+ palpable Rt DP   Dressing Applied:gauze and tegaderm, clean, dry and intact.

## 2016-11-15 ENCOUNTER — Encounter (HOSPITAL_COMMUNITY): Payer: Self-pay | Admitting: Internal Medicine

## 2016-11-15 DIAGNOSIS — I480 Paroxysmal atrial fibrillation: Secondary | ICD-10-CM

## 2016-11-15 DIAGNOSIS — F419 Anxiety disorder, unspecified: Secondary | ICD-10-CM | POA: Diagnosis not present

## 2016-11-15 DIAGNOSIS — Z7901 Long term (current) use of anticoagulants: Secondary | ICD-10-CM | POA: Diagnosis not present

## 2016-11-15 DIAGNOSIS — I10 Essential (primary) hypertension: Secondary | ICD-10-CM | POA: Diagnosis not present

## 2016-11-15 NOTE — Discharge Instructions (Signed)
No driving for 4 days. No lifting over 5 lbs for 1 week. No sexual activity for 1 week. You may return to work in 1 week. Keep procedure site clean & dry. If you notice increased pain, swelling, bleeding or pus, call/return!  You may shower, but no soaking baths/hot tubs/pools for 1 week.  ° ° °

## 2016-11-15 NOTE — Discharge Summary (Signed)
ELECTROPHYSIOLOGY PROCEDURE DISCHARGE SUMMARY    Patient ID: Wayne Pollard,  MRN: [id number], DOB/AGE: 51/02/1966 51 y.o.  Admit date: 11/14/2016 Discharge date: 11/15/2016  Primary Care Physician: Wayne Sarnaonald W Lee, MD Primary Cardiologist: Wayne Pollard Electrophysiologist: Wayne RangeJames Kathyrn Warmuth, MD  Primary Discharge Diagnosis:  Paroxysmal atrial fibrillation status post ablation this admission  Secondary Discharge Diagnosis:  1.  Bradycardia - resolved off BB 2.  Prior syncope  Procedures This Admission:  1.  Electrophysiology study and radiofrequency catheter ablation on 11/14/16 by Wayne Pollard.  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  This was felt to be the culprit for his afib.  He had very mild conduction within the left superior pulmonary vein also.  The right and left inferior pulmonary veins were quiescent from a prior ablation procedure at baseline today; the patient underwent successful sequential electrical re isolation and anatomical encircling of the superior pulmonary veins.   As his prior ablation was an antral procedure, I elected to perform WACA of all 4 PVs today; no inducible arrhythmias following ablation both on and off of Isuprel.  Adenosine also administered; no early apparent complications..    Brief HPI: Wayne Pollard is a 51 y.o. male with a history of paroxysmal atrial fibrillation.  They have failed medical therapy and underwent prior PVI in 2014.  He did well initially but developed recurrent atrial fibrillation. Risks, benefits, and alternatives to catheter ablation of atrial fibrillation were reviewed with the patient who wished to proceed.  The patient underwent cardiac CT prior to the procedure which demonstrated normal LV function and no LAA  thrombus.    Hospital Course:  The patient was admitted and underwent EPS/RFCA of atrial fibrillation with details as outlined above.  They were monitored on telemetry overnight which demonstrated sinus rhythm.  Groin was without complication on the day of discharge.  The patient was examined and considered to be stable for discharge.  Wound care and restrictions were reviewed with the patient.  The patient will be seen back by Wayne Cocoonna Carroll, NP in 4 weeks and Wayne Pollard in 12 weeks for post ablation follow up.   This patients CHA2DS2-VASc Score and unadjusted Ischemic Stroke Rate (% per year) is equal to 0.2 % stroke rate/year from a score of 0 Above score calculated as 1 point each if present [CHF, HTN, DM, Vascular=MI/PAD/Aortic Plaque, Age if 65-74, or Male] Above score calculated as 2 points each if present [Age > 75, or Stroke/TIA/TE]   Physical Exam: Vitals:   11/14/16 1706 11/14/16 2000 11/14/16 2030 11/15/16 0433  BP: 129/87 (!) 145/91  139/78  Pulse: 79 76  74  Resp: 18 (!) 24 18 17   Temp: 98.3 F (36.8 C) 98 F (36.7 C)  98.3 F (36.8 C)  TempSrc: Oral Oral  Oral  SpO2: 94% 96%  93%  Weight:    216 lb 0.8 oz (98 kg)  Height:        GEN- The patient is well appearing, alert and oriented x 3 today.   HEENT: normocephalic, atraumatic; sclera clear, conjunctiva pink; hearing intact; oropharynx clear; neck supple  Lungs- Clear to ausculation bilaterally, normal work of breathing.  No wheezes, rales, rhonchi Heart- Regular rate and rhythm, no murmurs, rubs or gallops  GI- soft, non-tender, non-distended, bowel sounds present  Extremities- no clubbing,  cyanosis, or edema; DP/PT/radial pulses 2+ bilaterally, groin without hematoma/bruit MS- no significant deformity or atrophy Skin- warm and dry, no rash or lesion Psych- euthymic mood, full affect Neuro- strength and sensation are intact   Labs:   Lab Results  Component Value Date   WBC 7.9 10/30/2016   HGB 16.3  10/17/2016   HCT 45.5 10/30/2016   MCV 91 10/30/2016   PLT 220 10/30/2016   No results for input(s): NA, K, CL, CO2, BUN, CREATININE, CALCIUM, PROT, BILITOT, ALKPHOS, ALT, AST, GLUCOSE in the last 168 hours.  Invalid input(s): LABALBU   Discharge Medications:  Allergies as of 11/15/2016      Reactions   Codeine Nausea And Vomiting      Medication List    TAKE these medications   ALPRAZolam 0.5 MG tablet Commonly known as:  XANAX Take 0.5 mg by mouth 3 (three) times daily as needed for anxiety.   amLODipine 10 MG tablet Commonly known as:  NORVASC Take 1 tablet (10 mg total) by mouth daily.   esomeprazole 20 MG capsule Commonly known as:  NEXIUM Take 20 mg by mouth daily at 12 noon.   GOODY HEADACHE PO Take 1 packet by mouth daily as needed (headaches).   HYDROcodone-acetaminophen 5-325 MG tablet Commonly known as:  NORCO/VICODIN Take 1 tablet by mouth every 6 (six) hours as needed for moderate pain.   nitroGLYCERIN 0.4 MG SL tablet Commonly known as:  NITROSTAT Place 1 tablet (0.4 mg total) under the tongue every 5 (five) minutes as needed for chest pain.   rivaroxaban 20 MG Tabs tablet Commonly known as:  XARELTO Take 1 tablet (20 mg total) by mouth daily with supper.   temazepam 15 MG capsule Commonly known as:  RESTORIL Take 15 mg by mouth at bedtime.   VISINE OP Apply 1 drop to eye daily as needed (dry eyes).       Disposition:  Discharge Instructions    Diet - low sodium heart healthy    Complete by:  As directed    Increase activity slowly    Complete by:  As directed      Follow-up Information    [facility] Follow up on 12/18/2016.   Specialty:  Cardiology Why:  at 2:30PM  Contact information: [address] [id number] [place] [address] [phone]       Wayne Range, MD Follow up on 02/12/2017.   Specialty:  Cardiology Why:  at 9:30AM  Contact information: [address] [place] [address] [phone]           Duration of Discharge Encounter: Greater than 30 minutes including physician time.  Signed, Wayne Balsam, NP 11/15/2016 7:17 AM  I have seen, examined the patient, and reviewed the above assessment and plan.  On exam RRR.  Groin looks ok today.  Changes to above are made where necessary.  DC to home.  Routine post ablation care.  Co Sign: Wayne Range, MD 11/15/2016 12:20 PM

## 2016-11-15 NOTE — Care Management Note (Signed)
Case Management Note  Patient Details  Name: Wayne Pollard MRN: [id number] Date of Birth: 11/15/1965  Subjective/Objective:   S/p afib ablation, for dc today, no needs.                 Action/Plan:   Expected Discharge Date:  11/15/16               Expected Discharge Plan:  Home/Self Care  In-House Referral:     Discharge planning Services  CM Consult  Post Acute Care Choice:    Choice offered to:     DME Arranged:    DME Agency:     HH Arranged:    HH Agency:     Status of Service:  Completed, signed off  If discussed at [employer] Length of Stay Meetings, dates discussed:    Additional Comments:  Wayne Pollard, Wayne Aumiller Clinton, RN 11/15/2016, 9:11 AM

## 2016-11-21 DIAGNOSIS — K219 Gastro-esophageal reflux disease without esophagitis: Secondary | ICD-10-CM | POA: Diagnosis not present

## 2016-11-21 DIAGNOSIS — I10 Essential (primary) hypertension: Secondary | ICD-10-CM | POA: Diagnosis not present

## 2016-11-21 DIAGNOSIS — I480 Paroxysmal atrial fibrillation: Secondary | ICD-10-CM | POA: Diagnosis not present

## 2016-11-21 DIAGNOSIS — M545 Low back pain: Secondary | ICD-10-CM | POA: Diagnosis not present

## 2016-12-18 ENCOUNTER — Encounter (HOSPITAL_COMMUNITY): Payer: Self-pay | Admitting: Nurse Practitioner

## 2016-12-18 ENCOUNTER — Ambulatory Visit (HOSPITAL_COMMUNITY)
Admission: RE | Admit: 2016-12-18 | Discharge: 2016-12-18 | Disposition: A | Discharge status: Home / Self Care | Payer: BLUE CROSS/BLUE SHIELD | Source: Ambulatory Visit | Attending: Nurse Practitioner | Admitting: Nurse Practitioner

## 2016-12-18 VITALS — BP 126/80 | HR 80 | Ht 72.0 in | Wt 217.0 lb

## 2016-12-18 DIAGNOSIS — I252 Old myocardial infarction: Secondary | ICD-10-CM | POA: Insufficient documentation

## 2016-12-18 DIAGNOSIS — R001 Bradycardia, unspecified: Secondary | ICD-10-CM | POA: Insufficient documentation

## 2016-12-18 DIAGNOSIS — Z9889 Other specified postprocedural states: Secondary | ICD-10-CM | POA: Diagnosis not present

## 2016-12-18 DIAGNOSIS — F419 Anxiety disorder, unspecified: Secondary | ICD-10-CM | POA: Diagnosis not present

## 2016-12-18 DIAGNOSIS — F17210 Nicotine dependence, cigarettes, uncomplicated: Secondary | ICD-10-CM | POA: Insufficient documentation

## 2016-12-18 DIAGNOSIS — Z7901 Long term (current) use of anticoagulants: Secondary | ICD-10-CM | POA: Diagnosis not present

## 2016-12-18 DIAGNOSIS — Z7982 Long term (current) use of aspirin: Secondary | ICD-10-CM | POA: Diagnosis not present

## 2016-12-18 DIAGNOSIS — I480 Paroxysmal atrial fibrillation: Secondary | ICD-10-CM | POA: Diagnosis not present

## 2016-12-18 DIAGNOSIS — R55 Syncope and collapse: Secondary | ICD-10-CM | POA: Insufficient documentation

## 2016-12-18 NOTE — Progress Notes (Signed)
Primary Care Physician: Raquel Sarnaonald W Lee, MD Referring Physician: Dr. Alena BillsAllred   Wayne Pollard is a 51 y.o. male with a h/o afib, s/p ablation 08/19/13 and 11/14/16. He states that he had some irregular heart beat last week but is in SR today. No swallowing or rt groin issues. Continues on xarelto.   Today, he denies symptoms of palpitations, chest pain, shortness of breath, orthopnea, PND, lower extremity edema, dizziness, presyncope, syncope, or neurologic sequela. The patient is tolerating medications without difficulties and is otherwise without complaint today.   Past Medical History:  Diagnosis Date  . Anxiety   . Bradycardia    a. 09/2013 adm - pindolol discontinued.  . Cervical disc disease 02/10/2016  . Chest pain    a. 04/2010 Cath: essentially nl cors, nl EF. Myoview low risk Aug 2016  . Paroxysmal atrial fibrillation (HCC)    a. previously failed flecainide/dilt/lopressor;  b. 08/2013 TEE: EF 55-60%, no rwma, no LAA thrombus or veg;  c. s/p PVI and afib ablation 08/2013;  d. chronic xarelto.  . Syncope    a. 09/2013 adm - not clear if related to bradycardia. Suspected orthostasis.   Past Surgical History:  Procedure Laterality Date  . APPENDECTOMY    . ATRIAL FIBRILLATION ABLATION N/A 08/19/2013   Procedure: ATRIAL FIBRILLATION ABLATION;  Surgeon: Gardiner RhymeJames D Allred, MD;  [facility]: [facility];  Service: Cardiovascular;  Laterality: N/A;  . CARDIAC CATHETERIZATION  2011   [facility], Pt reports no CAD  . CERVICAL LAMINECTOMY    . CHOLECYSTECTOMY    . ELECTROPHYSIOLOGIC STUDY N/A 11/14/2016   Procedure: Atrial Fibrillation Ablation;  Surgeon: Hillis RangeJames Allred, MD;  [facility];  Service: Cardiovascular;  Laterality: N/A;  . HERNIA REPAIR    . TEE WITHOUT CARDIOVERSION N/A 08/18/2013   Procedure: TRANSESOPHAGEAL ECHOCARDIOGRAM (TEE);  Surgeon: Lewayne BuntingBrian S Crenshaw, MD;  [facility];  Service: Cardiovascular;  Laterality: N/A;    Current Outpatient  Prescriptions  Medication Sig Dispense Refill  . ALPRAZolam (XANAX) 0.5 MG tablet Take 0.5 mg by mouth 3 (three) times daily as needed for anxiety.     Marland Kitchen. amLODipine (NORVASC) 10 MG tablet Take 1 tablet (10 mg total) by mouth daily. 90 tablet 3  . Aspirin-Acetaminophen-Caffeine (GOODY HEADACHE PO) Take 1 packet by mouth daily as needed (headaches).    . esomeprazole (NEXIUM) 20 MG capsule Take 20 mg by mouth daily at 12 noon.    Marland Kitchen. HYDROcodone-acetaminophen (NORCO/VICODIN) 5-325 MG per tablet Take 1 tablet by mouth every 6 (six) hours as needed for moderate pain.    . nitroGLYCERIN (NITROSTAT) 0.4 MG SL tablet Place 1 tablet (0.4 mg total) under the tongue every 5 (five) minutes as needed for chest pain. 90 tablet 3  . rivaroxaban (XARELTO) 20 MG TABS tablet Take 1 tablet (20 mg total) by mouth daily with supper. 30 tablet 11  . temazepam (RESTORIL) 15 MG capsule Take 15 mg by mouth at bedtime.    . Tetrahydrozoline HCl (VISINE OP) Apply 1 drop to eye daily as needed (dry eyes).     No current facility-administered medications for this encounter.     Allergies  Allergen Reactions  . Codeine Nausea And Vomiting    Social History   Social History  . Marital status: Married    Spouse name: N/A  . Number of children: 2  . Years of education: N/A   Occupational History  . communications    Social History Main Topics  .  Smoking status: Current Every Day Smoker    Types: Cigarettes    Last attempt to quit: 12/24/2012  . Smokeless tobacco: Never Used  . Alcohol use No  . Drug use: No  . Sexual activity: Not on file   Other Topics Concern  . Not on file   Social History Narrative   Pt lives in [place] with spouse.  Owns a Nurse, adult.    Family History  Problem Relation Age of Onset  . Heart attack Father     died @ 44.  Marland Kitchen Heart failure Father   . Hypertension Mother     ROS- All systems are reviewed and negative except as per the HPI above  Physical  Exam: Vitals:   12/18/16 1429  BP: 126/80  Pulse: 80  Weight: 217 lb (98.4 kg)  Height: 6' (1.829 m)   Wt Readings from Last 3 Encounters:  12/18/16 217 lb (98.4 kg)  11/15/16 216 lb 0.8 oz (98 kg)  10/23/16 211 lb 6.4 oz (95.9 kg)    Labs: Lab Results  Component Value Date   NA 142 10/30/2016   K 4.0 10/30/2016   CL 103 10/30/2016   CO2 23 10/30/2016   GLUCOSE 76 10/30/2016   BUN 12 10/30/2016   CREATININE 0.87 10/30/2016   CALCIUM 9.2 10/30/2016   MG 1.8 10/13/2013   Lab Results  Component Value Date   INR 1.02 10/16/2016   Lab Results  Component Value Date   CHOL 161 10/17/2016   HDL 35 (L) 10/17/2016   LDLCALC 105 (H) 10/17/2016   TRIG 104 10/17/2016     GEN- The patient is well appearing, alert and oriented x 3 today.   Head- normocephalic, atraumatic Eyes-  Sclera clear, conjunctiva pink Ears- hearing intact Oropharynx- clear Neck- supple, no JVP Lymph- no cervical lymphadenopathy Lungs- Clear to ausculation bilaterally, normal work of breathing Heart- Regular rate and rhythm, no murmurs, rubs or gallops, PMI not laterally displaced GI- soft, NT, ND, + BS Extremities- no clubbing, cyanosis, or edema MS- no significant deformity or atrophy Skin- no rash or lesion Psych- euthymic mood, full affect Neuro- strength and sensation are intact  EKG- NSR at 80 bpm, pr int 148 ms, qrs int 88 ms, qtc 438 ms Epic records reviewed    Assessment and Plan: 1. Afib S/p ablation Today in SR but had some irregular heart beat last week as picked up by BP cuff,  with pulse being read as irregular  Was encouraged to come by clinic if he feels like he may be in afib/machine reads HR as irregualr and will run an EKG tp confirm Continue xarelto   F/u with Dr. Johney Frame as scheduled 02/12/17  [place]vina Sidle. [facility] [address] [place], [place] [address] [phone]

## 2016-12-19 DIAGNOSIS — K219 Gastro-esophageal reflux disease without esophagitis: Secondary | ICD-10-CM | POA: Diagnosis not present

## 2016-12-19 DIAGNOSIS — I480 Paroxysmal atrial fibrillation: Secondary | ICD-10-CM | POA: Diagnosis not present

## 2016-12-19 DIAGNOSIS — I10 Essential (primary) hypertension: Secondary | ICD-10-CM | POA: Diagnosis not present

## 2016-12-19 DIAGNOSIS — Z79891 Long term (current) use of opiate analgesic: Secondary | ICD-10-CM | POA: Diagnosis not present

## 2016-12-19 DIAGNOSIS — M545 Low back pain: Secondary | ICD-10-CM | POA: Diagnosis not present

## 2017-01-16 DIAGNOSIS — I480 Paroxysmal atrial fibrillation: Secondary | ICD-10-CM | POA: Diagnosis not present

## 2017-01-16 DIAGNOSIS — K219 Gastro-esophageal reflux disease without esophagitis: Secondary | ICD-10-CM | POA: Diagnosis not present

## 2017-01-16 DIAGNOSIS — I10 Essential (primary) hypertension: Secondary | ICD-10-CM | POA: Diagnosis not present

## 2017-01-16 DIAGNOSIS — M545 Low back pain: Secondary | ICD-10-CM | POA: Diagnosis not present

## 2017-02-12 ENCOUNTER — Ambulatory Visit (INDEPENDENT_AMBULATORY_CARE_PROVIDER_SITE_OTHER): Payer: BLUE CROSS/BLUE SHIELD | Admitting: Internal Medicine

## 2017-02-12 ENCOUNTER — Encounter: Payer: Self-pay | Admitting: Internal Medicine

## 2017-02-12 ENCOUNTER — Encounter (INDEPENDENT_AMBULATORY_CARE_PROVIDER_SITE_OTHER): Payer: Self-pay

## 2017-02-12 VITALS — BP 132/80 | HR 66 | Ht 72.0 in | Wt 215.0 lb

## 2017-02-12 DIAGNOSIS — I10 Essential (primary) hypertension: Secondary | ICD-10-CM

## 2017-02-12 DIAGNOSIS — I480 Paroxysmal atrial fibrillation: Secondary | ICD-10-CM | POA: Diagnosis not present

## 2017-02-12 DIAGNOSIS — F17200 Nicotine dependence, unspecified, uncomplicated: Secondary | ICD-10-CM

## 2017-02-12 NOTE — Patient Instructions (Signed)
Medication Instructions:  STOP xarelto  Follow-Up: Your physician recommends that you schedule a follow-up appointment in: 3 MONTHS with Dr. Allred   Any Other Special Instructions Will Be Listed Below (If Applicable).     If you need a refill on your cardiac medications before your next appointment, please call your pharmacy.   

## 2017-02-12 NOTE — Progress Notes (Signed)
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 [name] today for routine electrophysiology followup.  Doing reasonably well.  Has rare palpitations at the end of the day but does not think he is having afib.  Denies complications from his afib ablation.  He has occasional sharp L chest pains early in the morning which he thinks is indigestion.  Denies exertional symptoms and declines any additional testing at this time.  Today, he denies symptoms of shortness of breath,  lower extremity edema, presyncope, or syncope.  The patient is otherwise without complaint today.   Past Medical History  Diagnosis Date  . Anxiety   . Paroxysmal atrial fibrillation     a. previously failed flecainide/dilt/lopressor;  b. 08/2013 TEE: EF 55-60%, no rwma, no LAA thrombus or veg;  c. s/p PVI and afib ablation 08/2013;  d. chronic xarelto.  . Chest pain     a. 04/2010 Cath: essentially nl cors, nl EF.  Marland Kitchen Syncope     a. 09/2013 adm - not clear if related to bradycardia. Suspected orthostasis.  . Bradycardia     a. 09/2013 adm - pindolol discontinued.   Past Surgical History  Procedure Laterality Date  . Appendectomy    . Cholecystectomy    . Hernia repair    . Cardiac catheterization      [facility], Pt reports no CAD  . Tee without cardioversion N/A 08/18/2013    Procedure: TRANSESOPHAGEAL ECHOCARDIOGRAM (TEE);  Surgeon: Lewayne Bunting, MD;  Location: [facility] ENDOSCOPY;  Service: Cardiovascular;  Laterality: N/A;  . Ablation  08/19/2013    PVI by Dr Johney Frame  . Atrial fibrillation ablation N/A 08/19/2013    Procedure: ATRIAL FIBRILLATION ABLATION;  Surgeon: Gardiner Rhyme, MD;  [facility];  Service: Cardiovascular;  Laterality: N/A;    Current Outpatient Prescriptions:  .  ALPRAZolam (XANAX) 0.5 MG tablet, Take 0.5 mg by mouth 3 (three) times daily as needed for anxiety. , Disp: , Rfl:  .  amLODipine (NORVASC) 10 MG tablet, Take 1 tablet  (10 mg total) by mouth daily., Disp: 90 tablet, Rfl: 3 .  Aspirin-Acetaminophen-Caffeine (GOODY HEADACHE PO), Take 1 packet by mouth daily as needed (headaches)., Disp: , Rfl:  .  esomeprazole (NEXIUM) 20 MG capsule, Take 20 mg by mouth daily at 12 noon., Disp: , Rfl:  .  HYDROcodone-acetaminophen (NORCO/VICODIN) 5-325 MG per tablet, Take 1 tablet by mouth every 6 (six) hours as needed for moderate pain., Disp: , Rfl:  .  nitroGLYCERIN (NITROSTAT) 0.4 MG SL tablet, Place 1 tablet (0.4 mg total) under the tongue every 5 (five) minutes as needed for chest pain., Disp: 90 tablet, Rfl: 3 .  rivaroxaban (XARELTO) 20 MG TABS tablet, Take 1 tablet (20 mg total) by mouth daily with supper., Disp: 30 tablet, Rfl: 11 .  temazepam (RESTORIL) 15 MG capsule, Take 15 mg by mouth at bedtime., Disp: , Rfl:  .  Tetrahydrozoline HCl (VISINE OP), Apply 1 drop to eye daily as needed (dry eyes)., Disp: , Rfl:     Physical Exam: Vitals:   02/12/17 0941  BP: 132/80  Pulse: 66   GEN- The patient is well appearing, alert and oriented x 3 today.   Head- normocephalic, atraumatic Eyes-  Sclera clear, conjunctiva pink Ears- hearing intact Oropharynx- clear Lungs- Clear to ausculation bilaterally, normal work of breathing Heart- Regular rate and rhythm, no murmurs, rubs or gallops, PMI  not laterally displaced GI- soft, NT, ND, + BS Extremities- no clubbing, cyanosis, or edema  ekg today reveals sinus rhythm 64 bpm, otherwise normal ekg  Assessment and Plan:  1. Bradycardia Resolved  2. Paroxysmal afib Doing well post ablation Stop xarelto today (chads2vasc score 1.  He is clear that he does not wish to continue anticoagulation) If he continues to have palpitations, would consider ILR placement vs Kardia  3. tobaccco Cessation advised He is not ready to quit  4. HTN Stable No change required today He says his BP is well controlled at home.  He does not wish to take antihypertensive medicines going  forward unless Bp elevates further  Return to see me in 3 months  Hillis Range MD, [facility] 02/12/2017 9:57 AM

## 2017-02-13 DIAGNOSIS — L409 Psoriasis, unspecified: Secondary | ICD-10-CM | POA: Diagnosis not present

## 2017-02-13 DIAGNOSIS — I480 Paroxysmal atrial fibrillation: Secondary | ICD-10-CM | POA: Diagnosis not present

## 2017-02-13 DIAGNOSIS — G4700 Insomnia, unspecified: Secondary | ICD-10-CM | POA: Diagnosis not present

## 2017-02-13 DIAGNOSIS — K219 Gastro-esophageal reflux disease without esophagitis: Secondary | ICD-10-CM | POA: Diagnosis not present

## 2017-03-14 DIAGNOSIS — I480 Paroxysmal atrial fibrillation: Secondary | ICD-10-CM | POA: Diagnosis not present

## 2017-03-14 DIAGNOSIS — L409 Psoriasis, unspecified: Secondary | ICD-10-CM | POA: Diagnosis not present

## 2017-03-14 DIAGNOSIS — G4700 Insomnia, unspecified: Secondary | ICD-10-CM | POA: Diagnosis not present

## 2017-03-14 DIAGNOSIS — K219 Gastro-esophageal reflux disease without esophagitis: Secondary | ICD-10-CM | POA: Diagnosis not present

## 2017-04-12 DIAGNOSIS — L400 Psoriasis vulgaris: Secondary | ICD-10-CM | POA: Diagnosis not present

## 2017-04-12 DIAGNOSIS — G4700 Insomnia, unspecified: Secondary | ICD-10-CM | POA: Diagnosis not present

## 2017-04-12 DIAGNOSIS — K219 Gastro-esophageal reflux disease without esophagitis: Secondary | ICD-10-CM | POA: Diagnosis not present

## 2017-04-12 DIAGNOSIS — I480 Paroxysmal atrial fibrillation: Secondary | ICD-10-CM | POA: Diagnosis not present

## 2017-05-10 DIAGNOSIS — L409 Psoriasis, unspecified: Secondary | ICD-10-CM | POA: Diagnosis not present

## 2017-05-10 DIAGNOSIS — I480 Paroxysmal atrial fibrillation: Secondary | ICD-10-CM | POA: Diagnosis not present

## 2017-05-10 DIAGNOSIS — G4700 Insomnia, unspecified: Secondary | ICD-10-CM | POA: Diagnosis not present

## 2017-05-10 DIAGNOSIS — K219 Gastro-esophageal reflux disease without esophagitis: Secondary | ICD-10-CM | POA: Diagnosis not present

## 2017-05-16 ENCOUNTER — Encounter: Payer: Self-pay | Admitting: Internal Medicine

## 2017-05-16 ENCOUNTER — Encounter (INDEPENDENT_AMBULATORY_CARE_PROVIDER_SITE_OTHER): Payer: Self-pay

## 2017-05-16 ENCOUNTER — Ambulatory Visit (INDEPENDENT_AMBULATORY_CARE_PROVIDER_SITE_OTHER): Payer: BLUE CROSS/BLUE SHIELD | Admitting: Internal Medicine

## 2017-05-16 VITALS — BP 118/76 | HR 66 | Ht 72.0 in | Wt 212.0 lb

## 2017-05-16 DIAGNOSIS — I480 Paroxysmal atrial fibrillation: Secondary | ICD-10-CM | POA: Diagnosis not present

## 2017-05-16 DIAGNOSIS — I10 Essential (primary) hypertension: Secondary | ICD-10-CM | POA: Diagnosis not present

## 2017-05-16 DIAGNOSIS — F17200 Nicotine dependence, unspecified, uncomplicated: Secondary | ICD-10-CM

## 2017-05-16 NOTE — Patient Instructions (Addendum)
Medication Instructions:  Your physician recommends that you continue on your current medications as directed. Please refer to the Current Medication list given to you today.   Labwork: None Ordered   Testing/Procedures: You will [facility] a LINQ monitor [facility] Report to [facility] Entrance at 6:30 am and proceed to Admitting You can eat and drink on the morning of your procedure You may take your medications on the morning of your procedure You may drive yourself to this procedure   Follow-Up: Your physician recommends that you schedule a follow-up appointment a wound check for 10 days after the insertion of your LINQ monitor   Your physician recommends that you schedule a follow-up appointment in: 3 months with Dr. Johney FrameAllred   If you need a refill on your cardiac medications before your next appointment, please call your pharmacy.   Thank you for choosing [facility]! Eligha BridegroomMichelle Anjelina Dung, RN [phone]

## 2017-05-16 NOTE — Progress Notes (Signed)
PCP: Raquel SarnaLee, Donald W, MD Primary Cardiologist: Dr SwazilandJordan  Wayne Pollard is a 51 y.o. male who presents today for routine electrophysiology followup.  Since last being seen in our clinic, the patient reports doing very well.  afib is well controlled.  He did wake last Tuesday with tachypalpitations and sweats of unclear etiology.  This self terminated after about 5 minutes.  Today, he denies symptoms of  chest pain, shortness of breath,  lower extremity edema, dizziness, presyncope, or syncope.  The patient is otherwise without complaint today.   Past Medical History:  Diagnosis Date  . Anxiety   . Bradycardia    a. 09/2013 adm - pindolol discontinued.  . Cervical disc disease 02/10/2016  . Chest pain    a. 04/2010 Cath: essentially nl cors, nl EF. Myoview low risk Aug 2016  . Paroxysmal atrial fibrillation (HCC)    a. previously failed flecainide/dilt/lopressor;  b. 08/2013 TEE: EF 55-60%, no rwma, no LAA thrombus or veg;  c. s/p PVI and afib ablation 08/2013;  d. chronic xarelto.  . Syncope    a. 09/2013 adm - not clear if related to bradycardia. Suspected orthostasis.   Past Surgical History:  Procedure Laterality Date  . APPENDECTOMY    . ATRIAL FIBRILLATION ABLATION N/A 08/19/2013   Procedure: ATRIAL FIBRILLATION ABLATION;  Surgeon: Gardiner RhymeJames D Ismaeel Arvelo, MD;  [facility]: [facility] CATH [facility];  Service: Cardiovascular;  Laterality: N/A;  . CARDIAC CATHETERIZATION  2011   [facility], Pt reports no CAD  . CERVICAL LAMINECTOMY    . CHOLECYSTECTOMY    . ELECTROPHYSIOLOGIC STUDY N/A 11/14/2016   Procedure: Atrial Fibrillation Ablation;  Surgeon: Hillis RangeJames Emmitt Matthews, MD;  [facility];  Service: Cardiovascular;  Laterality: N/A;  . HERNIA REPAIR    . TEE WITHOUT CARDIOVERSION N/A 08/18/2013   Procedure: TRANSESOPHAGEAL ECHOCARDIOGRAM (TEE);  Surgeon: Lewayne BuntingBrian S Crenshaw, MD;  [facility];  Service: Cardiovascular;  Laterality: N/A;    ROS- all systems are reviewed and negatives  except as per HPI above  Current Outpatient Prescriptions  Medication Sig Dispense Refill  . ALPRAZolam (XANAX) 0.5 MG tablet Take 0.5 mg by mouth 3 (three) times daily as needed for anxiety.     Marland Kitchen. amLODipine (NORVASC) 10 MG tablet Take 1 tablet (10 mg total) by mouth daily. 90 tablet 3  . Aspirin-Acetaminophen-Caffeine (GOODY HEADACHE PO) Take 1 packet by mouth daily as needed (headaches).    . esomeprazole (NEXIUM) 20 MG capsule Take 20 mg by mouth daily at 12 noon.    Marland Kitchen. HYDROcodone-acetaminophen (NORCO/VICODIN) 5-325 MG per tablet Take 1 tablet by mouth every 6 (six) hours as needed for moderate pain.    . nitroGLYCERIN (NITROSTAT) 0.4 MG SL tablet Place 1 tablet (0.4 mg total) under the tongue every 5 (five) minutes as needed for chest pain. 90 tablet 3  . temazepam (RESTORIL) 15 MG capsule Take 15 mg by mouth at bedtime.     No current facility-administered medications for this visit.     Physical Exam: Vitals:   05/16/17 1002  BP: 118/76  Pulse: 66  SpO2: 98%  Weight: 212 lb (96.2 kg)  Height: 6' (1.829 m)    GEN- The patient is well appearing, alert and oriented x 3 today.   Head- normocephalic, atraumatic Eyes-  Sclera clear, conjunctiva pink Ears- hearing intact Oropharynx- clear Lungs- Clear to ausculation bilaterally, normal work of breathing Heart- Regular rate and rhythm, no murmurs, rubs or gallops, PMI not laterally displaced GI-  soft, NT, ND, + BS Extremities- no clubbing, cyanosis, or edema  Assessment and Plan:  1. paroxysmal atrial fibrillation Doing well s/p ablation off AAD therapy chads2vasc score is 1.  No anticoagulation at this time according to guideline directed recommendations He does have palpitations (mostly nocturnal) of unclear significance. I would therefore advise implantable loop recorder placement for afib management post ablation.  Risks and benefits of the procedure were discussed with the patient who wishes to proceed.  2.  HTN Stable No change required today  3. Tobacco Smokes an occasional cigar to relax He is not ready to quit  Return to see me in 3 months   Hillis RangeJames Lenna Hagarty MD, [facility] 05/16/2017 10:24 AM

## 2017-05-24 ENCOUNTER — Encounter (HOSPITAL_COMMUNITY)
Admission: RE | Disposition: A | Discharge status: Home / Self Care | Payer: Self-pay | Source: Ambulatory Visit | Attending: Internal Medicine

## 2017-05-24 ENCOUNTER — Encounter (HOSPITAL_COMMUNITY): Payer: Self-pay | Admitting: Internal Medicine

## 2017-05-24 ENCOUNTER — Ambulatory Visit (HOSPITAL_COMMUNITY)
Admission: RE | Admit: 2017-05-24 | Discharge: 2017-05-24 | Disposition: A | Discharge status: Home / Self Care | Payer: BLUE CROSS/BLUE SHIELD | Source: Ambulatory Visit | Attending: Internal Medicine | Admitting: Internal Medicine

## 2017-05-24 DIAGNOSIS — F17290 Nicotine dependence, other tobacco product, uncomplicated: Secondary | ICD-10-CM | POA: Diagnosis not present

## 2017-05-24 DIAGNOSIS — I10 Essential (primary) hypertension: Secondary | ICD-10-CM | POA: Insufficient documentation

## 2017-05-24 DIAGNOSIS — F419 Anxiety disorder, unspecified: Secondary | ICD-10-CM | POA: Insufficient documentation

## 2017-05-24 DIAGNOSIS — Z79899 Other long term (current) drug therapy: Secondary | ICD-10-CM | POA: Diagnosis not present

## 2017-05-24 DIAGNOSIS — Z7982 Long term (current) use of aspirin: Secondary | ICD-10-CM | POA: Insufficient documentation

## 2017-05-24 DIAGNOSIS — I480 Paroxysmal atrial fibrillation: Secondary | ICD-10-CM | POA: Insufficient documentation

## 2017-05-24 DIAGNOSIS — I4891 Unspecified atrial fibrillation: Secondary | ICD-10-CM | POA: Diagnosis not present

## 2017-05-24 HISTORY — PX: LOOP RECORDER INSERTION: EP1214

## 2017-05-24 SURGERY — LOOP RECORDER INSERTION

## 2017-05-24 MED ORDER — LIDOCAINE-EPINEPHRINE 1 %-1:100000 IJ SOLN
INTRAMUSCULAR | Status: AC
  Filled 2017-05-24: qty 1

## 2017-05-24 SURGICAL SUPPLY — 2 items
LOOP REVEAL LINQSYS (Prosthesis & Implant Heart) ×2 IMPLANT
PACK LOOP INSERTION (CUSTOM PROCEDURE TRAY) ×2 IMPLANT

## 2017-05-24 NOTE — Discharge Instructions (Signed)
Verbal and Written instructions given to patient and wife.  Both verbalize understanding and deny further questions.

## 2017-05-24 NOTE — H&P (View-Only) (Signed)
 PCP: Lee, Donald W, MD Primary Cardiologist: Dr Jordan  Wayne Pollard is a 51 y.o. male who presents today for routine electrophysiology followup.  Since last being seen in our clinic, the patient reports doing very well.  afib is well controlled.  He did wake last Tuesday with tachypalpitations and sweats of unclear etiology.  This self terminated after about 5 minutes.  Today, he denies symptoms of  chest pain, shortness of breath,  lower extremity edema, dizziness, presyncope, or syncope.  The patient is otherwise without complaint today.   Past Medical History:  Diagnosis Date  . Anxiety   . Bradycardia    a. 09/2013 adm - pindolol discontinued.  . Cervical disc disease 02/10/2016  . Chest pain    a. 04/2010 Cath: essentially nl cors, nl EF. Myoview low risk Aug 2016  . Paroxysmal atrial fibrillation (HCC)    a. previously failed flecainide/dilt/lopressor;  b. 08/2013 TEE: EF 55-60%, no rwma, no LAA thrombus or veg;  c. s/p PVI and afib ablation 08/2013;  d. chronic xarelto.  . Syncope    a. 09/2013 adm - not clear if related to bradycardia. Suspected orthostasis.   Past Surgical History:  Procedure Laterality Date  . APPENDECTOMY    . ATRIAL FIBRILLATION ABLATION N/A 08/19/2013   Procedure: ATRIAL FIBRILLATION ABLATION;  Surgeon: Dealie Koelzer D Stanly Si, MD;  [facility]: [facility] CATH [facility];  Service: Cardiovascular;  Laterality: N/A;  . CARDIAC CATHETERIZATION  2011   [facility], Pt reports no CAD  . CERVICAL LAMINECTOMY    . CHOLECYSTECTOMY    . ELECTROPHYSIOLOGIC STUDY N/A 11/14/2016   Procedure: Atrial Fibrillation Ablation;  Surgeon: Elisse Pennick, MD;  [facility] INVASIVE CV LAB;  Service: Cardiovascular;  Laterality: N/A;  . HERNIA REPAIR    . TEE WITHOUT CARDIOVERSION N/A 08/18/2013   Procedure: TRANSESOPHAGEAL ECHOCARDIOGRAM (TEE);  Surgeon: Brian S Crenshaw, MD;  Location: [facility] ENDOSCOPY;  Service: Cardiovascular;  Laterality: N/A;    ROS- all systems are reviewed and negatives  except as per HPI above  Current Outpatient Prescriptions  Medication Sig Dispense Refill  . ALPRAZolam (XANAX) 0.5 MG tablet Take 0.5 mg by mouth 3 (three) times daily as needed for anxiety.     . amLODipine (NORVASC) 10 MG tablet Take 1 tablet (10 mg total) by mouth daily. 90 tablet 3  . Aspirin-Acetaminophen-Caffeine (GOODY HEADACHE PO) Take 1 packet by mouth daily as needed (headaches).    . esomeprazole (NEXIUM) 20 MG capsule Take 20 mg by mouth daily at 12 noon.    . HYDROcodone-acetaminophen (NORCO/VICODIN) 5-325 MG per tablet Take 1 tablet by mouth every 6 (six) hours as needed for moderate pain.    . nitroGLYCERIN (NITROSTAT) 0.4 MG SL tablet Place 1 tablet (0.4 mg total) under the tongue every 5 (five) minutes as needed for chest pain. 90 tablet 3  . temazepam (RESTORIL) 15 MG capsule Take 15 mg by mouth at bedtime.     No current facility-administered medications for this visit.     Physical Exam: Vitals:   05/16/17 1002  BP: 118/76  Pulse: 66  SpO2: 98%  Weight: 212 lb (96.2 kg)  Height: 6' (1.829 m)    GEN- The patient is well appearing, alert and oriented x 3 today.   Head- normocephalic, atraumatic Eyes-  Sclera clear, conjunctiva pink Ears- hearing intact Oropharynx- clear Lungs- Clear to ausculation bilaterally, normal work of breathing Heart- Regular rate and rhythm, no murmurs, rubs or gallops, PMI not laterally displaced GI-   soft, NT, ND, + BS Extremities- no clubbing, cyanosis, or edema  Assessment and Plan:  1. paroxysmal atrial fibrillation Doing well s/p ablation off AAD therapy chads2vasc score is 1.  No anticoagulation at this time according to guideline directed recommendations He does have palpitations (mostly nocturnal) of unclear significance. I would therefore advise implantable loop recorder placement for afib management post ablation.  Risks and benefits of the procedure were discussed with the patient who wishes to proceed.  2.  HTN Stable No change required today  3. Tobacco Smokes an occasional cigar to relax He is not ready to quit  Return to see me in 3 months   Hillis RangeJames Willie Plain MD, [facility] 05/16/2017 10:24 AM

## 2017-05-24 NOTE — Interval H&P Note (Signed)
History and Physical Interval Note:  05/24/2017 7:28 AM  Wayne Pollard  has presented today for surgery, with the diagnosis of paf  The various methods of treatment have been discussed with the patient and family. After consideration of risks, benefits and other options for treatment, the patient has consented to  Procedure(s): LOOP RECORDER INSERTION (N/A) as a surgical intervention .  The patient's history has been reviewed, patient examined, no change in status, stable for surgery.  I have reviewed the patient's chart and labs.  Questions were answered to the patient's satisfaction.     [name]

## 2017-05-31 ENCOUNTER — Telehealth: Payer: Self-pay | Admitting: Internal Medicine

## 2017-05-31 NOTE — Telephone Encounter (Signed)
Patient reports he accidentally knocked his home monitor off his nightstand and when he picked it back up it had an orange screen with a phone number on it.  Patient proceeded to send three manual transmissions to clear the alert.  He does not recall what the number was and he reports that his handbook says that alert can mean "that there's something wrong with my heart."  Advised patient that I reviewed the manual reports that he sent and they suggest no abnormalities, presenting rhythm is sinus rhythm.  1 symptom episode from 05/26/17 was accidental per patient.  No other episodes.  Gave patient phone number for [employer] tech services as he would like a further explanation about this possible error code.  He is appreciative and denies additional questions or concerns at this time.

## 2017-05-31 NOTE — Telephone Encounter (Signed)
New message    1. Has your device fired? Lighting up orange  2. Is you device beeping? yes  3. Are you experiencing draining or swelling at device site? no  4. Are you calling to see if we received your device transmission? no  5. Have you passed out? No, device just beeping and lighting up orange

## 2017-06-06 ENCOUNTER — Ambulatory Visit (INDEPENDENT_AMBULATORY_CARE_PROVIDER_SITE_OTHER): Payer: Self-pay | Admitting: *Deleted

## 2017-06-06 DIAGNOSIS — I480 Paroxysmal atrial fibrillation: Secondary | ICD-10-CM

## 2017-06-06 LAB — CUP PACEART INCLINIC DEVICE CHECK
Date Time Interrogation Session: 20180822091857
MDC IDC PG IMPLANT DT: 20180809

## 2017-06-06 NOTE — Progress Notes (Signed)
Loop wound check in clinic. LINQ implanted for AF management s/p AF ablation. Battery status: good. R-waves 50mV. 1 symptom episode- Wayne Pollard reports that his grandson pressed the button, he was asymptomatic at the time. Monthly summary reports and ROV with JA 08/20/17.

## 2017-06-07 DIAGNOSIS — I480 Paroxysmal atrial fibrillation: Secondary | ICD-10-CM | POA: Diagnosis not present

## 2017-06-07 DIAGNOSIS — K219 Gastro-esophageal reflux disease without esophagitis: Secondary | ICD-10-CM | POA: Diagnosis not present

## 2017-06-07 DIAGNOSIS — G4700 Insomnia, unspecified: Secondary | ICD-10-CM | POA: Diagnosis not present

## 2017-06-07 DIAGNOSIS — L409 Psoriasis, unspecified: Secondary | ICD-10-CM | POA: Diagnosis not present

## 2017-06-25 ENCOUNTER — Ambulatory Visit (INDEPENDENT_AMBULATORY_CARE_PROVIDER_SITE_OTHER): Payer: BLUE CROSS/BLUE SHIELD | Admitting: *Deleted

## 2017-06-25 DIAGNOSIS — I480 Paroxysmal atrial fibrillation: Secondary | ICD-10-CM | POA: Diagnosis not present

## 2017-06-25 NOTE — Progress Notes (Signed)
[employer] Summary Report / Loop Recorder 

## 2017-06-27 LAB — CUP PACEART REMOTE DEVICE CHECK
Implantable Pulse Generator Implant Date: 20180809
MDC IDC SESS DTM: 20180908113939

## 2017-07-06 DIAGNOSIS — K219 Gastro-esophageal reflux disease without esophagitis: Secondary | ICD-10-CM | POA: Diagnosis not present

## 2017-07-06 DIAGNOSIS — I10 Essential (primary) hypertension: Secondary | ICD-10-CM | POA: Diagnosis not present

## 2017-07-06 DIAGNOSIS — G4700 Insomnia, unspecified: Secondary | ICD-10-CM | POA: Diagnosis not present

## 2017-07-06 DIAGNOSIS — I480 Paroxysmal atrial fibrillation: Secondary | ICD-10-CM | POA: Diagnosis not present

## 2017-07-12 DIAGNOSIS — S161XXA Strain of muscle, fascia and tendon at neck level, initial encounter: Secondary | ICD-10-CM | POA: Diagnosis not present

## 2017-07-12 DIAGNOSIS — M5032 Other cervical disc degeneration, mid-cervical region, unspecified level: Secondary | ICD-10-CM | POA: Diagnosis not present

## 2017-07-12 DIAGNOSIS — M9902 Segmental and somatic dysfunction of thoracic region: Secondary | ICD-10-CM | POA: Diagnosis not present

## 2017-07-12 DIAGNOSIS — M9901 Segmental and somatic dysfunction of cervical region: Secondary | ICD-10-CM | POA: Diagnosis not present

## 2017-07-24 ENCOUNTER — Ambulatory Visit (INDEPENDENT_AMBULATORY_CARE_PROVIDER_SITE_OTHER): Payer: BLUE CROSS/BLUE SHIELD | Admitting: *Deleted

## 2017-07-24 DIAGNOSIS — I480 Paroxysmal atrial fibrillation: Secondary | ICD-10-CM

## 2017-07-25 NOTE — Progress Notes (Signed)
[employer] Summary Report / Loop Recorder 

## 2017-07-30 LAB — CUP PACEART REMOTE DEVICE CHECK
Date Time Interrogation Session: 20181008124115
MDC IDC PG IMPLANT DT: 20180809

## 2017-08-02 DIAGNOSIS — I10 Essential (primary) hypertension: Secondary | ICD-10-CM | POA: Diagnosis not present

## 2017-08-02 DIAGNOSIS — I480 Paroxysmal atrial fibrillation: Secondary | ICD-10-CM | POA: Diagnosis not present

## 2017-08-02 DIAGNOSIS — K219 Gastro-esophageal reflux disease without esophagitis: Secondary | ICD-10-CM | POA: Diagnosis not present

## 2017-08-02 DIAGNOSIS — G4700 Insomnia, unspecified: Secondary | ICD-10-CM | POA: Diagnosis not present

## 2017-08-20 ENCOUNTER — Encounter: Payer: BLUE CROSS/BLUE SHIELD | Admitting: Internal Medicine

## 2017-08-21 ENCOUNTER — Other Ambulatory Visit: Payer: Self-pay | Admitting: Internal Medicine

## 2017-08-22 ENCOUNTER — Ambulatory Visit (INDEPENDENT_AMBULATORY_CARE_PROVIDER_SITE_OTHER): Payer: BLUE CROSS/BLUE SHIELD | Admitting: *Deleted

## 2017-08-22 DIAGNOSIS — I480 Paroxysmal atrial fibrillation: Secondary | ICD-10-CM

## 2017-08-22 NOTE — Progress Notes (Signed)
[employer] Summary Report / Loop Recorder 

## 2017-08-23 LAB — CUP PACEART REMOTE DEVICE CHECK
Date Time Interrogation Session: 20181107131058
Implantable Pulse Generator Implant Date: 20180809

## 2017-08-30 DIAGNOSIS — I10 Essential (primary) hypertension: Secondary | ICD-10-CM | POA: Diagnosis not present

## 2017-08-30 DIAGNOSIS — G4700 Insomnia, unspecified: Secondary | ICD-10-CM | POA: Diagnosis not present

## 2017-08-30 DIAGNOSIS — K219 Gastro-esophageal reflux disease without esophagitis: Secondary | ICD-10-CM | POA: Diagnosis not present

## 2017-08-30 DIAGNOSIS — I480 Paroxysmal atrial fibrillation: Secondary | ICD-10-CM | POA: Diagnosis not present

## 2017-09-17 ENCOUNTER — Other Ambulatory Visit: Payer: Self-pay | Admitting: Internal Medicine

## 2017-09-21 ENCOUNTER — Ambulatory Visit (INDEPENDENT_AMBULATORY_CARE_PROVIDER_SITE_OTHER): Payer: BLUE CROSS/BLUE SHIELD | Admitting: *Deleted

## 2017-09-21 DIAGNOSIS — I480 Paroxysmal atrial fibrillation: Secondary | ICD-10-CM | POA: Diagnosis not present

## 2017-09-24 NOTE — Progress Notes (Signed)
[employer] Summary Report / Loop Recorder 

## 2017-09-26 ENCOUNTER — Encounter: Payer: BLUE CROSS/BLUE SHIELD | Admitting: Internal Medicine

## 2017-09-28 ENCOUNTER — Encounter: Payer: Self-pay | Admitting: Internal Medicine

## 2017-09-29 LAB — CUP PACEART REMOTE DEVICE CHECK
Date Time Interrogation Session: 20181207143800
MDC IDC PG IMPLANT DT: 20180809

## 2017-10-03 DIAGNOSIS — L409 Psoriasis, unspecified: Secondary | ICD-10-CM | POA: Diagnosis not present

## 2017-10-03 DIAGNOSIS — I480 Paroxysmal atrial fibrillation: Secondary | ICD-10-CM | POA: Diagnosis not present

## 2017-10-03 DIAGNOSIS — G4700 Insomnia, unspecified: Secondary | ICD-10-CM | POA: Diagnosis not present

## 2017-10-03 DIAGNOSIS — K219 Gastro-esophageal reflux disease without esophagitis: Secondary | ICD-10-CM | POA: Diagnosis not present

## 2017-10-10 ENCOUNTER — Encounter: Payer: Self-pay | Admitting: Internal Medicine

## 2017-10-22 ENCOUNTER — Ambulatory Visit (INDEPENDENT_AMBULATORY_CARE_PROVIDER_SITE_OTHER): Payer: BLUE CROSS/BLUE SHIELD | Admitting: *Deleted

## 2017-10-22 DIAGNOSIS — I480 Paroxysmal atrial fibrillation: Secondary | ICD-10-CM

## 2017-10-22 NOTE — Progress Notes (Signed)
[employer] Summary Report / Loop Recorder 

## 2017-11-02 LAB — CUP PACEART REMOTE DEVICE CHECK
Date Time Interrogation Session: 20190106150958
MDC IDC PG IMPLANT DT: 20180809

## 2017-11-06 DIAGNOSIS — I480 Paroxysmal atrial fibrillation: Secondary | ICD-10-CM | POA: Diagnosis not present

## 2017-11-06 DIAGNOSIS — I10 Essential (primary) hypertension: Secondary | ICD-10-CM | POA: Diagnosis not present

## 2017-11-07 ENCOUNTER — Telehealth: Payer: Self-pay | Admitting: Cardiology

## 2017-11-07 NOTE — Telephone Encounter (Signed)
Spoke w/ pt and requested that he send a manual transmission b/c his home monitor has not updated in at least 14 days.   

## 2017-11-20 ENCOUNTER — Ambulatory Visit (INDEPENDENT_AMBULATORY_CARE_PROVIDER_SITE_OTHER): Payer: BLUE CROSS/BLUE SHIELD | Admitting: *Deleted

## 2017-11-20 DIAGNOSIS — I480 Paroxysmal atrial fibrillation: Secondary | ICD-10-CM

## 2017-11-20 NOTE — Progress Notes (Signed)
[employer] Summary Report / Loop Recorder 

## 2017-11-21 ENCOUNTER — Telehealth: Payer: Self-pay | Admitting: Cardiology

## 2017-11-21 NOTE — Telephone Encounter (Signed)
Spoke with pt and reminded pt of remote transmission that is due today. Pt verbalized understanding.   

## 2017-12-08 DIAGNOSIS — G4700 Insomnia, unspecified: Secondary | ICD-10-CM | POA: Diagnosis not present

## 2017-12-08 DIAGNOSIS — I480 Paroxysmal atrial fibrillation: Secondary | ICD-10-CM | POA: Diagnosis not present

## 2017-12-08 DIAGNOSIS — K219 Gastro-esophageal reflux disease without esophagitis: Secondary | ICD-10-CM | POA: Diagnosis not present

## 2017-12-08 DIAGNOSIS — L409 Psoriasis, unspecified: Secondary | ICD-10-CM | POA: Diagnosis not present

## 2017-12-11 LAB — CUP PACEART REMOTE DEVICE CHECK
Date Time Interrogation Session: 20190205151531
MDC IDC PG IMPLANT DT: 20180809

## 2017-12-24 ENCOUNTER — Ambulatory Visit (INDEPENDENT_AMBULATORY_CARE_PROVIDER_SITE_OTHER): Payer: BLUE CROSS/BLUE SHIELD | Admitting: *Deleted

## 2017-12-24 DIAGNOSIS — I480 Paroxysmal atrial fibrillation: Secondary | ICD-10-CM

## 2017-12-24 NOTE — Progress Notes (Signed)
[employer] Summary Report / Loop Recorder 

## 2018-01-02 ENCOUNTER — Telehealth: Payer: Self-pay | Admitting: *Deleted

## 2018-01-02 NOTE — Telephone Encounter (Signed)
Follow up ° ° ° °Returning call  °

## 2018-01-02 NOTE — Telephone Encounter (Signed)
Spoke with patient.  He reports he was working from his home office on date/time of episode and does not recall any symptoms or anything that could've caused interference.  Patient is agreeable to scheduling overdue f/u appointment with Dr. Johney FrameAllred on 01/16/18 at 11:30am.  He is appreciative of call and denies additional questions or concerns at this time.

## 2018-01-02 NOTE — Telephone Encounter (Signed)
Attempted to reach patient to discuss tachy episode noted on LINQ from 12/31/17 at 1352--ECG appears EMI.  Will attempt to determine what patient was doing at the time of the episode.  Will also schedule overdue f/u with Dr. Johney FrameAllred.    No VM set up at cell number, home number out of service.

## 2018-01-04 ENCOUNTER — Encounter: Payer: Self-pay | Admitting: Internal Medicine

## 2018-01-07 ENCOUNTER — Other Ambulatory Visit: Payer: Self-pay | Admitting: Internal Medicine

## 2018-01-16 ENCOUNTER — Encounter: Payer: BLUE CROSS/BLUE SHIELD | Admitting: Internal Medicine

## 2018-01-21 ENCOUNTER — Ambulatory Visit: Payer: BLUE CROSS/BLUE SHIELD | Admitting: Internal Medicine

## 2018-01-21 ENCOUNTER — Encounter: Payer: Self-pay | Admitting: Internal Medicine

## 2018-01-21 VITALS — BP 156/98 | HR 70 | Ht 72.0 in | Wt 212.0 lb

## 2018-01-21 DIAGNOSIS — I480 Paroxysmal atrial fibrillation: Secondary | ICD-10-CM | POA: Diagnosis not present

## 2018-01-21 DIAGNOSIS — G4700 Insomnia, unspecified: Secondary | ICD-10-CM | POA: Diagnosis not present

## 2018-01-21 DIAGNOSIS — R42 Dizziness and giddiness: Secondary | ICD-10-CM | POA: Diagnosis not present

## 2018-01-21 DIAGNOSIS — I10 Essential (primary) hypertension: Secondary | ICD-10-CM

## 2018-01-21 DIAGNOSIS — R400 Somnolence: Secondary | ICD-10-CM | POA: Diagnosis not present

## 2018-01-21 DIAGNOSIS — F419 Anxiety disorder, unspecified: Secondary | ICD-10-CM | POA: Diagnosis not present

## 2018-01-21 LAB — CUP PACEART INCLINIC DEVICE CHECK
Date Time Interrogation Session: 20190408100621
Implantable Pulse Generator Implant Date: 20180809

## 2018-01-21 NOTE — Progress Notes (Signed)
PCP: Philemon Kingdom, MD Primary Cardiologist: Dr [place] Primary EP: Dr Johney Frame  Wayne Pollard is a 52 y.o. male who presents today for routine electrophysiology followup.  Since last being seen in our clinic, the patient reports doing very well.  He has rare dizziness episodes, with dizziness lasting several minutes.  He attributed this to BP medicines and has stopped all BP medications except for metoprolol.  He states that he is seeing a new primary doctor today and is going to "start over" with his BP management.  Of note, he does not sleep well.  He is not well rested upon waking and has daytime fatigue.  He has not had a prior sleep study.  Today, he denies symptoms of palpitations, chest pain, shortness of breath,  lower extremity edema, dizziness, presyncope, or syncope.  The patient is otherwise without complaint today.   Past Medical History:  Diagnosis Date  . Anxiety   . Bradycardia    a. 09/2013 adm - pindolol discontinued.  . Cervical disc disease 02/10/2016  . Chest pain    a. 04/2010 Cath: essentially nl cors, nl EF. Myoview low risk Aug 2016  . Paroxysmal atrial fibrillation (HCC)    a. previously failed flecainide/dilt/lopressor;  b. 08/2013 TEE: EF 55-60%, no rwma, no LAA thrombus or veg;  c. s/p PVI and afib ablation 08/2013;  d. chronic xarelto.  . Syncope    a. 09/2013 adm - not clear if related to bradycardia. Suspected orthostasis.   Past Surgical History:  Procedure Laterality Date  . APPENDECTOMY    . ATRIAL FIBRILLATION ABLATION N/A 08/19/2013   Procedure: ATRIAL FIBRILLATION ABLATION;  Surgeon: Gardiner Rhyme, MD;  Location: [facility] CATH LAB;  Service: Cardiovascular;  Laterality: N/A;  . CARDIAC CATHETERIZATION  2011   [facility], Pt reports no CAD  . CERVICAL LAMINECTOMY    . CHOLECYSTECTOMY    . ELECTROPHYSIOLOGIC STUDY N/A 11/14/2016   Procedure: Atrial Fibrillation Ablation;  Surgeon: Hillis Range, MD;  Location: [facility] INVASIVE CV LAB;  Service:  Cardiovascular;  Laterality: N/A;  . HERNIA REPAIR    . LOOP RECORDER INSERTION N/A 05/24/2017   Procedure: LOOP RECORDER INSERTION;  Surgeon: Hillis Range, MD;  Location: [facility] INVASIVE CV LAB;  Service: Cardiovascular;  Laterality: N/A;  . TEE WITHOUT CARDIOVERSION N/A 08/18/2013   Procedure: TRANSESOPHAGEAL ECHOCARDIOGRAM (TEE);  Surgeon: Lewayne Bunting, MD;  Location: [facility] ENDOSCOPY;  Service: Cardiovascular;  Laterality: N/A;    ROS- all systems are reviewed and negatives except as per HPI above  Current Outpatient Medications  Medication Sig Dispense Refill  . ALPRAZolam (XANAX) 0.5 MG tablet Take 0.5 mg by mouth 2 (two) times daily as needed for anxiety.     . Aspirin-Acetaminophen-Caffeine (GOODY HEADACHE PO) Take 1 packet by mouth daily as needed (headaches).    . esomeprazole (NEXIUM) 40 MG capsule Take 40 mg by mouth daily at 12 noon.    Marland Kitchen HYDROcodone-acetaminophen (NORCO) 10-325 MG tablet Take 1 tablet by mouth 3 (three) times daily as needed for severe pain.    . metoprolol succinate (TOPROL-XL) 50 MG 24 hr tablet Take 50 mg by mouth daily. Take with or immediately following a meal.    . nitroGLYCERIN (NITROSTAT) 0.4 MG SL tablet Place 1 tablet (0.4 mg total) under the tongue every 5 (five) minutes as needed for chest pain. 90 tablet 3  . temazepam (RESTORIL) 30 MG capsule Take 30 mg by mouth at bedtime.    . Tetrahydrozoline HCl (VISINE  OP) Apply 1 drop to eye daily.    Marland Kitchen. Ustekinumab (STELARA [place]) Inject 1 Dose into the skin every 3 (three) months.     No current facility-administered medications for this visit.     Physical Exam: Vitals:   01/21/18 0955  BP: (!) 156/98  Pulse: 70  SpO2: 98%  Weight: 212 lb (96.2 kg)  Height: 6' (1.829 m)    GEN- The patient is well appearing, alert and oriented x 3 today.   Head- normocephalic, atraumatic Eyes-  Sclera clear, conjunctiva pink Ears- hearing intact Oropharynx- clear Lungs- Clear to ausculation bilaterally, normal work  of breathing Heart- Regular rate and rhythm, no murmurs, rubs or gallops, PMI not laterally displaced GI- soft, NT, ND, + BS Extremities- no clubbing, cyanosis, or edema  EKG tracing ordered today is personally reviewed and shows sinus rhythm,  Assessment and Plan:  1. afib Well controlled No episodes by ILR interrogation chads2vasc score is 1.  He is not on anticoagulation  2. Hypertension Elevated today He wishes to make no changes until he sees his new PCP I have advised sleep study which may reveal a secondary cause for his sleep apnea.  He will discuss with PCP  3. Dizziness Unclear etiology No arrhythmias on ILR Adequate hydration is encouraged Pause detections have been turned on today    [facility] He wishes to return in a year  Hillis RangeJames Kamryn Messineo MD, [facility] HospitalFACC 01/21/2018 9:56 AM

## 2018-01-21 NOTE — Patient Instructions (Signed)

## 2018-01-25 ENCOUNTER — Ambulatory Visit (INDEPENDENT_AMBULATORY_CARE_PROVIDER_SITE_OTHER): Payer: BLUE CROSS/BLUE SHIELD | Admitting: *Deleted

## 2018-01-25 DIAGNOSIS — I480 Paroxysmal atrial fibrillation: Secondary | ICD-10-CM

## 2018-01-28 NOTE — Progress Notes (Signed)
[employer] Summary Report / Loop Recorder 

## 2018-01-31 LAB — CUP PACEART REMOTE DEVICE CHECK
Implantable Pulse Generator Implant Date: 20180809
MDC IDC SESS DTM: 20190310154056

## 2018-02-19 DIAGNOSIS — F419 Anxiety disorder, unspecified: Secondary | ICD-10-CM | POA: Diagnosis not present

## 2018-02-19 DIAGNOSIS — I10 Essential (primary) hypertension: Secondary | ICD-10-CM | POA: Diagnosis not present

## 2018-02-26 IMAGING — CT CT HEART MORPH/PULM VEIN W/ CM & W/O CA SCORE
1 of 5 series · 10 of 20 positions shown, 13 images · IV contrast (Omni 300)
Comparison: 10/16/2016

CLINICAL DATA: Pre Ablation Atrial Fibrillation

EXAM:
Cardiac CTA
MEDICATIONS:
None
TECHNIQUE: The patient was scanned on a Siemens [REDACTED]ice scanner. Gantry
rotation speed was 270 msecs. Collimation was .9mm. A 100 kV
prospective scan was triggered in the descending thoracic aorta at
111 HU's with 5% padding centered around 78% of the R-R interval.
Average HR during the scan was 68 bpm. The 3D data set was
interpreted on a dedicated work station using MPR, MIP and VRT
modes. A total of 80cc of contrast was used.

[Series 8: coradseq 0.5 i26f 3 65 - 80 % · axial · 0.44mm/px · z∈[+1089,+1229]mm · 10 of 2296 slices shown, 13 images]
[im 209/2296  vessel]
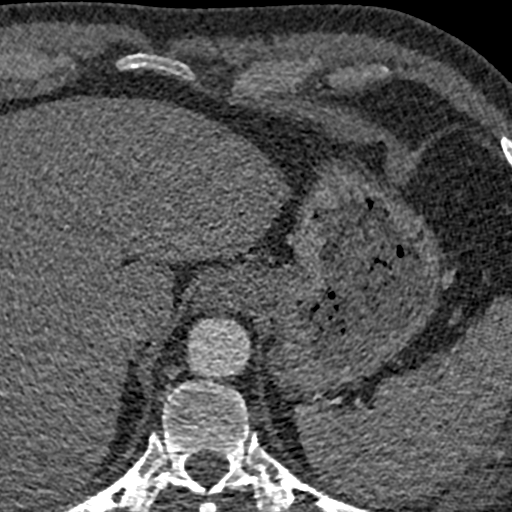
[im 209/2296  lung]
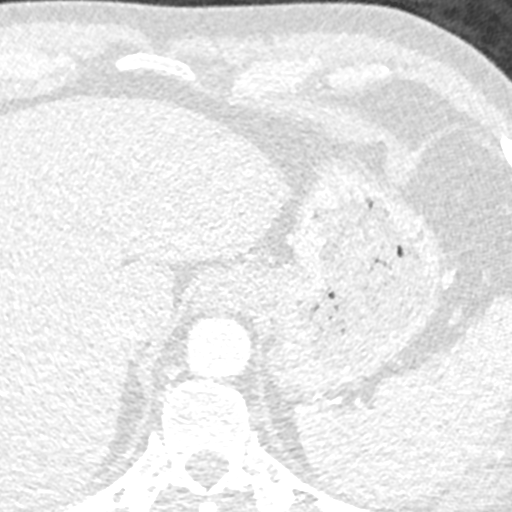
[im 418/2296  vessel]
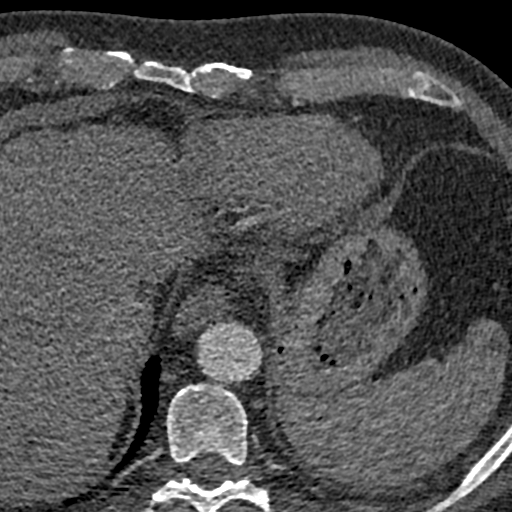
[im 626/2296  vessel]
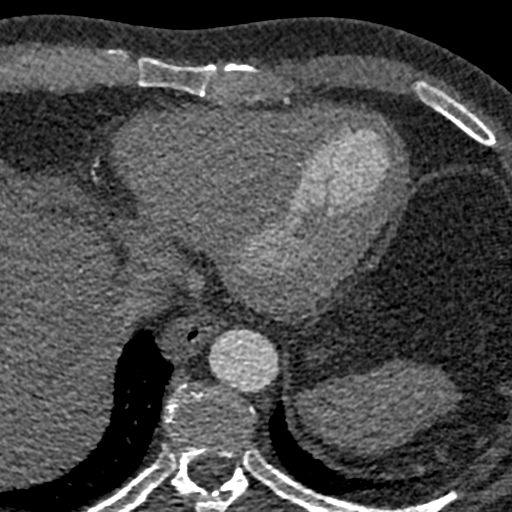
[im 835/2296  vessel]
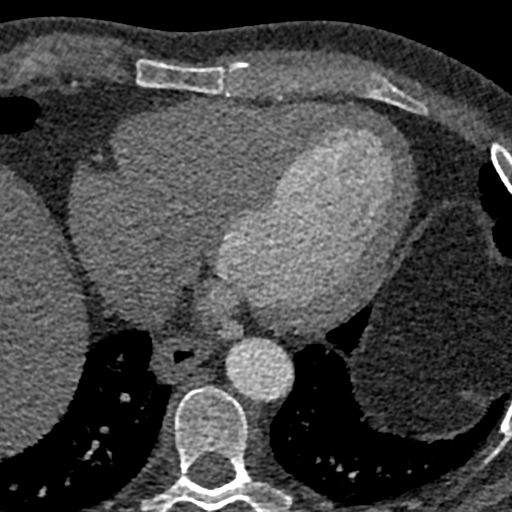
[im 1044/2296  vessel]
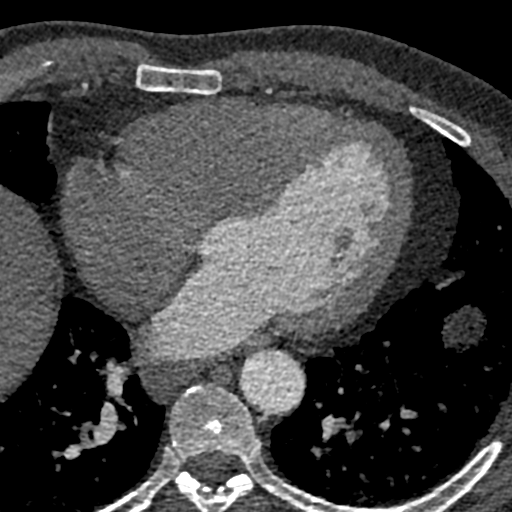
[im 1044/2296  lung]
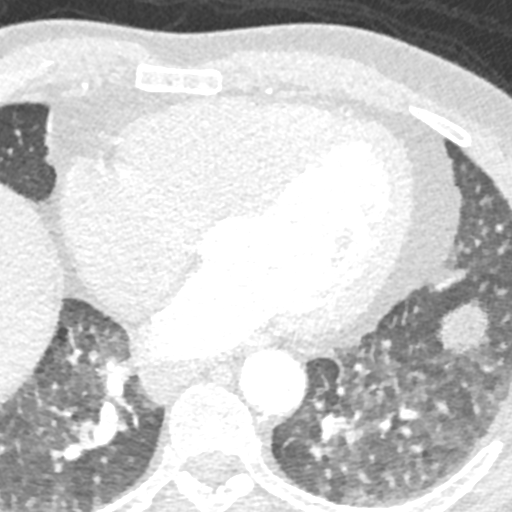
[im 1252/2296  vessel]
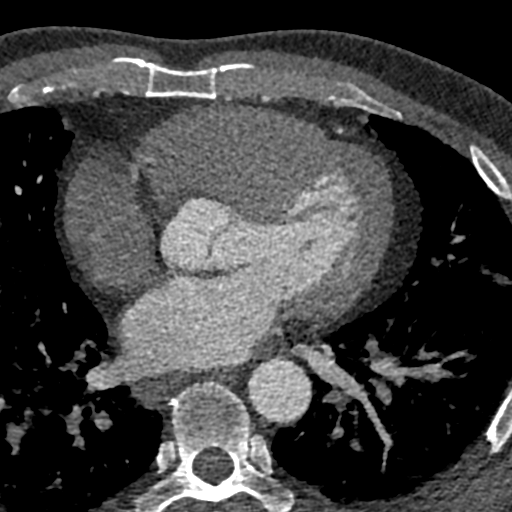
[im 1461/2296  vessel]
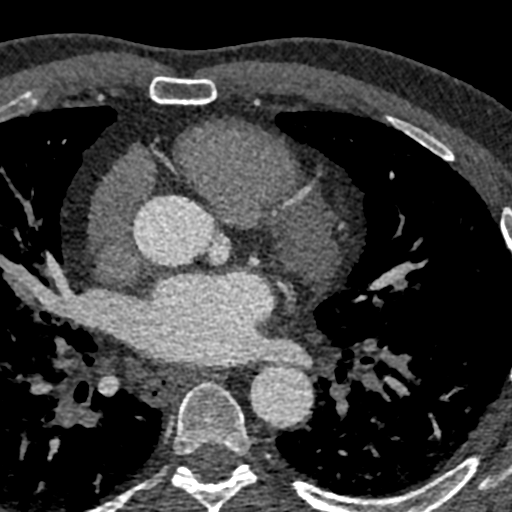
[im 1670/2296  vessel]
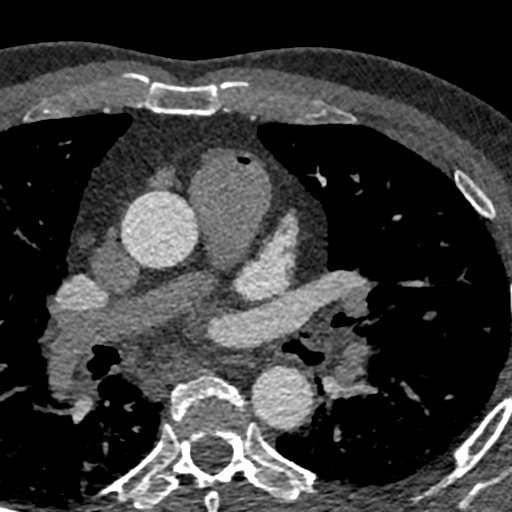
[im 1878/2296  vessel]
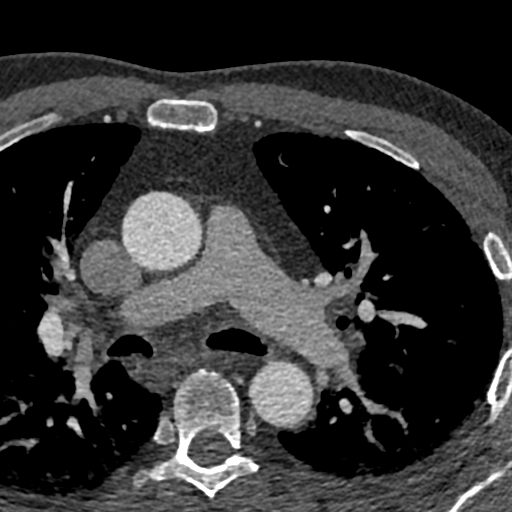
[im 1878/2296  lung]
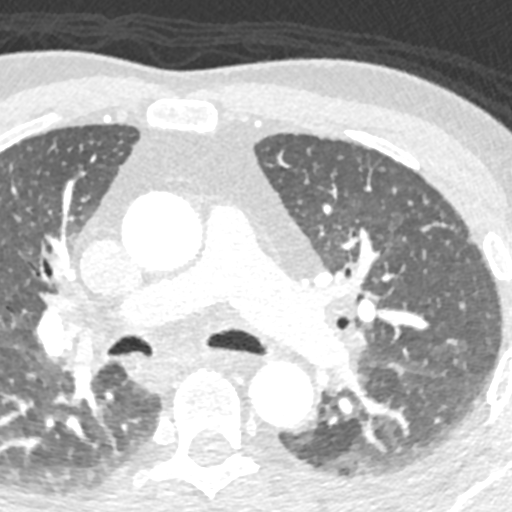
[im 2087/2296  vessel]
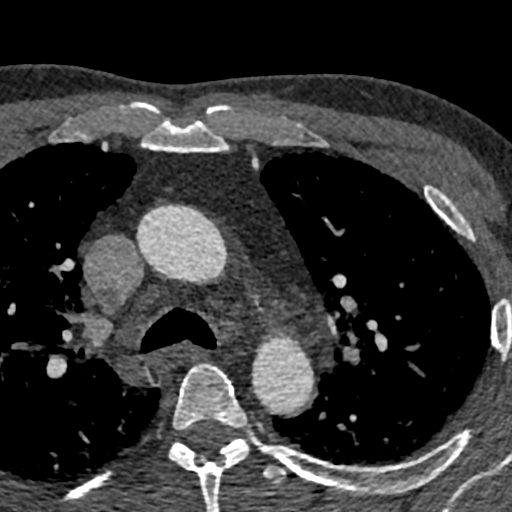

[10 of 20 positions shown; findings below may reference images not displayed]

FINDINGS: Non-cardiac: See separate report from [REDACTED]. No
significant findings on limited lung and soft tissue windows.

Calcium Score:  20 isolated calcium seen in mid LAD

There was mild LAE. There was no ASD/VSD. There were no anomalous
veins appreciated. There was no pericardial effusion. There was no
MELENA thrombus. The esophagus coursed closest to the RLPV
All 4 pulmonary veins drained normally into the LA. Measurements as
below

LUPV Ostium 15 mm Area 1.7 cm2

LLPV Ostium 15 mm Area 1.4 cm2

RUPV Ostium 19.5 mm Area 2.8 cm2

RLPV Ostium 20 mm Area 2.9 cm2
IMPRESSION: 1) No MELENA thrombus

2) Normal pulmonary veins measurements as above

3) Mild LAE

4) No pericardial effusion

5) Esophagus courses closest to RLPV

6) Coronary calcium socre 20 which is 74th percentile for age and
sex

Gdull Sang Jati

EXAM:
OVER-READ INTERPRETATION  CT CHEST

The following report is an over-read performed by radiologist Dr.
Kevic Sotong [REDACTED] on 11/09/2016. This over-read
does not include interpretation of cardiac or coronary anatomy or
pathology. The coronary CTA interpretation by the cardiologist is
attached.
FINDINGS: Cardiovascular: Heart is borderline in size. Aorta is normal
caliber.

Mediastinum/Nodes: Scattered small mediastinal lymph nodes, none
pathologically enlarged or changed since prior study.

Lungs/Pleura: Visualized lungs are clear.  No effusions.

Upper Abdomen: Imaging into the upper abdomen shows no acute
findings.

Musculoskeletal: Visualized chest wall soft tissues are
unremarkable. No acute bony abnormality.
IMPRESSION: No acute or significant extracardiac abnormality.

## 2018-02-27 ENCOUNTER — Ambulatory Visit (INDEPENDENT_AMBULATORY_CARE_PROVIDER_SITE_OTHER): Payer: BLUE CROSS/BLUE SHIELD | Admitting: *Deleted

## 2018-02-27 DIAGNOSIS — I480 Paroxysmal atrial fibrillation: Secondary | ICD-10-CM

## 2018-02-27 LAB — CUP PACEART REMOTE DEVICE CHECK
Date Time Interrogation Session: 20190412161002
Implantable Pulse Generator Implant Date: 20180809

## 2018-02-28 NOTE — Progress Notes (Signed)
[employer] Summary Report / Loop Recorder 

## 2018-03-22 LAB — CUP PACEART REMOTE DEVICE CHECK
MDC IDC PG IMPLANT DT: 20180809
MDC IDC SESS DTM: 20190515174011

## 2018-03-27 DIAGNOSIS — L4050 Arthropathic psoriasis, unspecified: Secondary | ICD-10-CM | POA: Diagnosis not present

## 2018-03-27 DIAGNOSIS — L400 Psoriasis vulgaris: Secondary | ICD-10-CM | POA: Diagnosis not present

## 2018-04-01 ENCOUNTER — Ambulatory Visit (INDEPENDENT_AMBULATORY_CARE_PROVIDER_SITE_OTHER): Payer: BLUE CROSS/BLUE SHIELD | Admitting: *Deleted

## 2018-04-01 DIAGNOSIS — I480 Paroxysmal atrial fibrillation: Secondary | ICD-10-CM | POA: Diagnosis not present

## 2018-04-01 NOTE — Progress Notes (Signed)
[employer] Summary Report / Loop Recorder 

## 2018-04-08 DIAGNOSIS — Z79899 Other long term (current) drug therapy: Secondary | ICD-10-CM | POA: Diagnosis not present

## 2018-04-08 DIAGNOSIS — F419 Anxiety disorder, unspecified: Secondary | ICD-10-CM | POA: Diagnosis not present

## 2018-04-08 DIAGNOSIS — I10 Essential (primary) hypertension: Secondary | ICD-10-CM | POA: Diagnosis not present

## 2018-04-08 DIAGNOSIS — R531 Weakness: Secondary | ICD-10-CM | POA: Diagnosis not present

## 2018-04-08 DIAGNOSIS — L400 Psoriasis vulgaris: Secondary | ICD-10-CM | POA: Diagnosis not present

## 2018-04-29 DIAGNOSIS — L400 Psoriasis vulgaris: Secondary | ICD-10-CM | POA: Diagnosis not present

## 2018-05-02 ENCOUNTER — Other Ambulatory Visit: Payer: Self-pay | Admitting: Internal Medicine

## 2018-05-06 ENCOUNTER — Ambulatory Visit (INDEPENDENT_AMBULATORY_CARE_PROVIDER_SITE_OTHER): Payer: BLUE CROSS/BLUE SHIELD | Admitting: *Deleted

## 2018-05-06 DIAGNOSIS — I480 Paroxysmal atrial fibrillation: Secondary | ICD-10-CM

## 2018-05-06 NOTE — Progress Notes (Signed)
[employer] Summary Report / Loop Recorder 

## 2018-05-08 LAB — CUP PACEART REMOTE DEVICE CHECK
Implantable Pulse Generator Implant Date: 20180809
MDC IDC SESS DTM: 20190617173652

## 2018-05-20 DIAGNOSIS — R51 Headache: Secondary | ICD-10-CM | POA: Diagnosis not present

## 2018-05-20 DIAGNOSIS — H538 Other visual disturbances: Secondary | ICD-10-CM | POA: Diagnosis not present

## 2018-05-20 DIAGNOSIS — R112 Nausea with vomiting, unspecified: Secondary | ICD-10-CM | POA: Diagnosis not present

## 2018-05-20 DIAGNOSIS — R410 Disorientation, unspecified: Secondary | ICD-10-CM | POA: Diagnosis not present

## 2018-06-06 ENCOUNTER — Ambulatory Visit (INDEPENDENT_AMBULATORY_CARE_PROVIDER_SITE_OTHER): Payer: BLUE CROSS/BLUE SHIELD | Admitting: *Deleted

## 2018-06-06 DIAGNOSIS — I10 Essential (primary) hypertension: Secondary | ICD-10-CM

## 2018-06-06 DIAGNOSIS — I480 Paroxysmal atrial fibrillation: Secondary | ICD-10-CM | POA: Diagnosis not present

## 2018-06-06 NOTE — Progress Notes (Signed)
[employer] Summary Report / Loop Recorder 

## 2018-06-20 LAB — CUP PACEART REMOTE DEVICE CHECK
Date Time Interrogation Session: 20190720180704
MDC IDC PG IMPLANT DT: 20180809

## 2018-07-09 ENCOUNTER — Ambulatory Visit (INDEPENDENT_AMBULATORY_CARE_PROVIDER_SITE_OTHER): Payer: BLUE CROSS/BLUE SHIELD | Admitting: *Deleted

## 2018-07-09 DIAGNOSIS — I480 Paroxysmal atrial fibrillation: Secondary | ICD-10-CM

## 2018-07-09 LAB — CUP PACEART REMOTE DEVICE CHECK
Implantable Pulse Generator Implant Date: 20180809
MDC IDC SESS DTM: 20190822183801

## 2018-07-10 NOTE — Progress Notes (Signed)
[employer] Summary Report / Loop Recorder 

## 2018-07-15 LAB — CUP PACEART REMOTE DEVICE CHECK
MDC IDC PG IMPLANT DT: 20180809
MDC IDC SESS DTM: 20190924214125

## 2018-08-05 DIAGNOSIS — R42 Dizziness and giddiness: Secondary | ICD-10-CM | POA: Diagnosis not present

## 2018-08-05 DIAGNOSIS — Z79899 Other long term (current) drug therapy: Secondary | ICD-10-CM | POA: Diagnosis not present

## 2018-08-12 ENCOUNTER — Ambulatory Visit (INDEPENDENT_AMBULATORY_CARE_PROVIDER_SITE_OTHER): Payer: BLUE CROSS/BLUE SHIELD | Admitting: *Deleted

## 2018-08-12 DIAGNOSIS — I480 Paroxysmal atrial fibrillation: Secondary | ICD-10-CM

## 2018-08-12 NOTE — Progress Notes (Signed)
[employer] Summary Report / Loop Recorder 

## 2018-09-04 LAB — CUP PACEART REMOTE DEVICE CHECK
Implantable Pulse Generator Implant Date: 20180809
MDC IDC SESS DTM: 20191027221056

## 2018-09-11 DIAGNOSIS — I10 Essential (primary) hypertension: Secondary | ICD-10-CM | POA: Diagnosis not present

## 2018-09-11 DIAGNOSIS — Z79891 Long term (current) use of opiate analgesic: Secondary | ICD-10-CM | POA: Diagnosis not present

## 2018-09-11 DIAGNOSIS — G8929 Other chronic pain: Secondary | ICD-10-CM | POA: Diagnosis not present

## 2018-09-11 DIAGNOSIS — R51 Headache: Secondary | ICD-10-CM | POA: Diagnosis not present

## 2018-09-16 ENCOUNTER — Ambulatory Visit (INDEPENDENT_AMBULATORY_CARE_PROVIDER_SITE_OTHER): Payer: BLUE CROSS/BLUE SHIELD

## 2018-09-16 DIAGNOSIS — I480 Paroxysmal atrial fibrillation: Secondary | ICD-10-CM | POA: Diagnosis not present

## 2018-09-16 NOTE — Progress Notes (Signed)
[employer] Summary Report / Loop Recorder 

## 2018-09-30 ENCOUNTER — Ambulatory Visit: Payer: BLUE CROSS/BLUE SHIELD | Admitting: Neurology

## 2018-09-30 ENCOUNTER — Encounter: Payer: Self-pay | Admitting: Neurology

## 2018-09-30 VITALS — BP 161/106 | HR 102 | Ht 72.0 in | Wt 204.0 lb

## 2018-09-30 DIAGNOSIS — R51 Headache with orthostatic component, not elsewhere classified: Secondary | ICD-10-CM

## 2018-09-30 DIAGNOSIS — F1110 Opioid abuse, uncomplicated: Secondary | ICD-10-CM

## 2018-09-30 DIAGNOSIS — Z79899 Other long term (current) drug therapy: Secondary | ICD-10-CM

## 2018-09-30 DIAGNOSIS — R519 Headache, unspecified: Secondary | ICD-10-CM

## 2018-09-30 DIAGNOSIS — H5713 Ocular pain, bilateral: Secondary | ICD-10-CM | POA: Diagnosis not present

## 2018-09-30 DIAGNOSIS — Z91199 Patient's noncompliance with other medical treatment and regimen due to unspecified reason: Secondary | ICD-10-CM

## 2018-09-30 DIAGNOSIS — G441 Vascular headache, not elsewhere classified: Secondary | ICD-10-CM

## 2018-09-30 DIAGNOSIS — G4453 Primary thunderclap headache: Secondary | ICD-10-CM

## 2018-09-30 DIAGNOSIS — H93A1 Pulsatile tinnitus, right ear: Secondary | ICD-10-CM

## 2018-09-30 DIAGNOSIS — Z9119 Patient's noncompliance with other medical treatment and regimen: Secondary | ICD-10-CM

## 2018-09-30 DIAGNOSIS — F1310 Sedative, hypnotic or anxiolytic abuse, uncomplicated: Secondary | ICD-10-CM

## 2018-09-30 NOTE — Patient Instructions (Addendum)
MRI of the brain and MRA of the head Labs     Opioid Overdose Opioids are substances that relieve pain by binding to pain receptors in your brain and spinal cord. Opioids include illegal drugs, such as heroin, as well as prescription pain medicines.An opioid overdose happens when you take too much of an opioid substance. This can happen with any type of opioid, including:  Heroin.  Morphine.  Codeine.  Methadone.  Oxycodone.  Hydrocodone.  Fentanyl.  Hydromorphone.  Buprenorphine.  The effects of an overdose can be mild, dangerous, or even deadly. Opioid overdose is a medical emergency. What are the causes? This condition may be caused by:  Taking too much of an opioid by accident.  Taking too much of an opioid on purpose.  An error made by a health care provider who prescribes a medicine.  An error made by the pharmacist who fills the prescription order.  Using more than one substance that contains opioids at the same time.  Mixing an opioid with a substance that affects your heart, breathing, or blood pressure. These include alcohol, tranquilizers, sleeping pills, illegal drugs, and some over-the-counter medicines.  What increases the risk? This condition is more likely in:  Children. They may be attracted to colorful pills. Because of a child's small size, even a small amount of a drug can be dangerous.  Elderly people. They may be taking many different drugs. Elderly people may have difficulty reading labels or remembering when they last took their medicine.  People who take an opioid on a long-term basis.  People who use: ? Illegal drugs. ? Other substances, including alcohol, while using an opioid.  People who have: ? A history of drug or alcohol abuse. ? Certain mental health conditions.  People who take opioids that are not prescribed for them.  What are the signs or symptoms? Symptoms of this condition depend on the type of opioid and the  amount that was taken. Common symptoms include:  Sleepiness or difficulty waking from sleep.  Confusion.  Slurred speech.  Slowed breathing and a slow pulse.  Nausea and vomiting.  Abnormally small pupils.  Signs and symptoms that require emergency treatment include:  Cold, clammy, and pale skin.  Blue lips and fingernails.  Vomiting.  Gurgling sounds in the throat.  A pulse that is very slow or difficult to detect.  Breathing that is very slow, noisy, or difficult to detect.  Limp body.  Inability to respond to speech or be awakened from sleep (stupor).  How is this diagnosed? This condition is diagnosed based on your symptoms. It is important to tell your health care provider:  All of the opioidsthat you took.  When you took the opioids.  Whether you were drinking alcohol or using other substances.  Your health care provider will do a physical exam. This exam may include:  Checking and monitoring your heart rate and rhythm, your breathing rate and depth, your temperature, and your blood pressure (vital signs).  Checking for abnormally small pupils.  Measuring oxygen levels in your blood.  You may also have blood tests or urine tests. How is this treated? Supporting your vital signs and your breathing is the first step in treating an opioid overdose. Treatment may also include:  Giving fluids and minerals (electrolytes) through an IV tube.  Inserting a breathing tube (endotracheal tube) in your airway to help you breathe.  Giving oxygen.  Passing a tube through your nose and into your stomach (NG tube, or  nasogastric tube) to wash out your stomach.  Giving medicines that: ? Increase your blood pressure. ? Absorb any opioid that is in your digestive system. ? Reverse the effects of the opioid (naloxone).  Ongoing counseling and mental health support if you intentionally overdosed or used an illegal drug.  Follow these instructions at home:  Take  over-the-counter and prescription medicines only as told by your health care provider. Always ask your health care provider about possible side effects and interactions of any new medicine that you start taking.  Keep a list of all of the medicines that you take, including over-the-counter medicines. Bring this list with you to all of your medical visits.  Drink enough fluid to keep your urine clear or pale yellow.  Keep all follow-up visits as told by your health care provider. This is important. How is this prevented?  Get help if you are struggling with: ? Alcohol or drug use. ? Depression or another mental health problem.  Keep the phone number of your local poison control center near your phone or on your cell phone.  Store all medicines in safety containers that are out of the reach of children.  Read the drug inserts that come with your medicines.  Do not drink alcohol when taking opioids.  Do not use illegal drugs.  Do not take opioid medicines that are not prescribed for you. Contact a health care provider if:  Your symptoms return.  You develop new symptoms or side effects when you are taking medicines. Get help right away if:  You think that you or someone else may have taken too much of an opioid. The hotline of the [facility] is 516-144-4203.  You or someone else is having symptoms of an opioid overdose.  You have serious thoughts about hurting yourself or others.  You have: ? Chest pain. ? Difficulty breathing. ? A loss of consciousness. Opioid overdose is an emergency. Do not wait to see if the symptoms will go away. Get medical help right away. Call your local emergency services (911 in the [place] Do not drive yourself to the hospital. This information is not intended to replace advice given to you by your health care provider. Make sure you discuss any questions you have with your health care provider. Document Released: 11/09/2004  Document Revised: 03/09/2016 Document Reviewed: 03/18/2015 Elsevier Interactive Patient Education  [employer].

## 2018-09-30 NOTE — Progress Notes (Signed)
[facility]    Provider:  Dr Lucia Gaskins Referring Provider: Philemon Kingdom, MD Primary Care Physician:  Philemon Kingdom, MD  CC:  Memory loss, severe headaches, confusion, nausea  HPI:  Wayne Pollard is a 52 y.o. male here as requested by Dr. Sudie Bailey for headache and dizziness  Past medical history hypertension, depression, anxiety, A. Fib. He denies migraine history. He has daily headaches. His headaches last a few hours or 10 minutes. They are 3-4 to 10 in pain. The headaches start on the right side, pounding, sharp, no autonomic symptoms. Can be on the left as well. He was taking goody powders and naproxen every day multiple times a day 6-8. Taking goody powders "forever". He takes the Toradol twice daily. He declines an MRI of the brain. Worse when reading things and working. He has not had his eyes checked. He also gets dizzy. He is struggling with his numbers. He says he was at [place] and had a CT of the head. He says this started in August or prior, no inciting event. He has been on goody powder for his knees and low back. He can get started on conversation and lose all track. 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 Denies migraine history. He denies light sensitivity, but he may throw up from the pain. He has daily episodes 5-6 up to 10 episodes. Hurts in the right occipital area. Can switch sides. Memory worsens with the headaches.   Reviewed notes, labs and imaging from outside physicians, which showed:  Magnesium normal, BMP is unremarkable with BUN 9 and creatinine is 0.89.  He was given 90 hydrocodone acetaminophen tablets.  Reviewed Dr. Geoffery Spruce notes.  Patient is on hydrocodone and Xanax.  Patient has been getting very dizzy has a severe headache with it.  He describes the dizziness is like being on a merry-go-round.  States this is been going on for about 3 weeks now.  It is not all the time he has been unable to find a pattern  to it.  Anything that requires a lot of thinking or reading causes it to flareup very badly.  Sometimes when he is having a conversation he will even lose track of what he saying.  That seems to be getting worse.  Denies slurred speech.  States when it first started he had a prickly feeling on the right side of his face but that resolved.  He is lost his billfold in the last couple weeks has never done that before.  He has taken [facility] or Goody powder and/or naproxen and ibuprofen every 4 hours.  The headaches can come and go in a dime.  CT of the head was normal by report.   Review of Systems: Patient complains of symptoms per HPI as well as the following symptoms: .  Memory loss, confusion, headache, slurred speech, difficulty swallowing, dizziness, snoring, depression, anxiety, racing thoughts, blurred vision, loss of vision, fatigue, shortness of breath, easy bruising, easy bleeding, feeling cold, hearing loss, ringing in ears, spinning sensation, trouble swallowing, joint pain, cramps pertinent negatives and positives per HPI. All others negative.   Social History   Socioeconomic History  . Marital status: Married    Spouse name: Not on file  . Number of children: 2  . Years of education: Not on file  . Highest education level: Not on file  Occupational History  . Occupation: Engineer, site Needs  . Financial resource strain: Not on file  . Food  insecurity:    Worry: Not on file    Inability: Not on file  . Transportation needs:    Medical: Not on file    Non-medical: Not on file  Tobacco Use  . Smoking status: Current Every Day Smoker    Packs/day: 3.00    Types: Cigars    Last attempt to quit: 12/24/2012    Years since quitting: 5.7  . Smokeless tobacco: Never Used  . Tobacco comment: 3 cigars/day  Substance and Sexual Activity  . Alcohol use: No    Alcohol/week: 0.0 standard drinks  . Drug use: No  . Sexual activity: Not on file  Lifestyle  . Physical activity:     Days per week: Not on file    Minutes per session: Not on file  . Stress: Not on file  Relationships  . Social connections:    Talks on phone: Not on file    Gets together: Not on file    Attends religious service: Not on file    Active member of club or organization: Not on file    Attends meetings of clubs or organizations: Not on file    Relationship status: Not on file  . Intimate partner violence:    Fear of current or ex partner: Not on file    Emotionally abused: Not on file    Physically abused: Not on file    Forced sexual activity: Not on file  Other Topics Concern  . Not on file  Social History Narrative   Pt lives in [place] with spouse.  Owns a Nurse, adult.    Family History  Problem Relation Age of Onset  . Heart attack Father        died @ 9.  Marland Kitchen Heart failure Father   . Stroke Father   . Hypertension Mother   . Stroke Paternal Grandfather   . Stroke Paternal Uncle     Past Medical History:  Diagnosis Date  . Anxiety   . Bradycardia    a. 09/2013 adm - pindolol discontinued.  . Cervical disc disease 02/10/2016  . Chest pain    a. 04/2010 Cath: essentially nl cors, nl EF. Myoview low risk Aug 2016  . Hypertension   . Paroxysmal atrial fibrillation (HCC)    a. previously failed flecainide/dilt/lopressor;  b. 08/2013 TEE: EF 55-60%, no rwma, no LAA thrombus or veg;  c. s/p PVI and afib ablation 08/2013;  d. chronic xarelto.  . Syncope    a. 09/2013 adm - not clear if related to bradycardia. Suspected orthostasis.    Past Surgical History:  Procedure Laterality Date  . APPENDECTOMY    . ATRIAL FIBRILLATION ABLATION N/A 08/19/2013   Procedure: ATRIAL FIBRILLATION ABLATION;  Surgeon: Gardiner Rhyme, MD;  Location: [facility] CATH LAB;  Service: Cardiovascular;  Laterality: N/A;  . CARDIAC CATHETERIZATION  2011   [facility], Pt reports no CAD  . CERVICAL LAMINECTOMY    . CHOLECYSTECTOMY    . ELECTROPHYSIOLOGIC STUDY N/A 11/14/2016    Procedure: Atrial Fibrillation Ablation;  Surgeon: Hillis Range, MD;  Location: [facility] INVASIVE CV LAB;  Service: Cardiovascular;  Laterality: N/A;  . HERNIA REPAIR    . KNEE ARTHROSCOPY Bilateral   . LOOP RECORDER INSERTION N/A 05/24/2017   Procedure: LOOP RECORDER INSERTION;  Surgeon: Hillis Range, MD;  Location: [facility] INVASIVE CV LAB;  Service: Cardiovascular;  Laterality: N/A;  . TEE WITHOUT CARDIOVERSION N/A 08/18/2013   Procedure: TRANSESOPHAGEAL ECHOCARDIOGRAM (TEE);  Surgeon: Lewayne Bunting, MD;  Location: [facility] ENDOSCOPY;  Service: Cardiovascular;  Laterality: N/A;  . WRIST SURGERY Bilateral     Current Outpatient Medications  Medication Sig Dispense Refill  . ALPRAZolam (XANAX) 0.5 MG tablet Take 0.5 mg by mouth 2 (two) times daily as needed for anxiety.     . Aspirin-Acetaminophen-Caffeine (GOODY HEADACHE PO) Take 1 packet by mouth daily as needed (headaches).    . esomeprazole (NEXIUM) 40 MG capsule Take 40 mg by mouth daily at 12 noon.    Marland Kitchen. HYDROcodone-acetaminophen (NORCO) 10-325 MG tablet Take 1 tablet by mouth 3 (three) times daily as needed for severe pain.    . nitroGLYCERIN (NITROSTAT) 0.4 MG SL tablet Place 1 tablet (0.4 mg total) under the tongue every 5 (five) minutes as needed for chest pain. 90 tablet 3  . temazepam (RESTORIL) 30 MG capsule Take 30 mg by mouth at bedtime.    . Tetrahydrozoline HCl (VISINE OP) Apply 1 drop to eye daily.    Marland Kitchen. Ustekinumab (STELARA [place]) Inject 1 Dose into the skin every 3 (three) months.     No current facility-administered medications for this visit.     Allergies as of 09/30/2018 - Review Complete 09/30/2018  Allergen Reaction Noted  . Codeine Nausea And Vomiting 04/23/2013    Vitals: BP (!) 161/106   Pulse (!) 102   Ht 6' (1.829 m)   Wt 204 lb (92.5 kg)   BMI 27.67 kg/m  Last Weight:  Wt Readings from Last 1 Encounters:  09/30/18 204 lb (92.5 kg)   Last Height:   Ht Readings from Last 1 Encounters:  09/30/18 6' (1.829 m)    Physical exam: Exam: Gen: NAD, conversant, very tangential                   CV: RRR, no MRG. No Carotid Bruits. No peripheral edema, warm, nontender Eyes: Conjunctivae clear without exudates or hemorrhage  Neuro: Detailed Neurologic Exam  Speech:    Speech is normal; fluent and spontaneous with normal comprehension.  Cognition:    The patient is oriented to person, place, and time;     recent and remote memory intact;     language fluent;     normal attention, concentration,     fund of knowledge Cranial Nerves:    The pupils are equal, round, and reactive to light. Attempted fundoscopic exam could not visualize. Visual fields are full to finger confrontation. Extraocular movements are intact. Trigeminal sensation is intact and the muscles of mastication are normal. The face is symmetric. The palate elevates in the midline. Hearing intact. Voice is normal. Shoulder shrug is normal. The tongue has normal motion without fasciculations.   Coordination:    Normal finger to nose and heel to shin.   Gait:    Heel-toe and tandem gait are normal.   Motor Observation:    No asymmetry, no atrophy, and no involuntary movements noted. Tone:    Normal muscle tone.    Posture:    Posture is normal. normal erect    Strength:    Strength is V/V in the upper and lower limbs.      Sensation: intact to LT     Reflex Exam:  DTR's:    Deep tendon reflexes in the upper and lower extremities are symmetrical bilaterally.   Toes:    The toes are equiv bilaterally.   Clonus:    Clonus is absent.       Assessment/Plan:  Chronic daily headaches, denies any migraine history,  started possibly 4 or more months ago, can be severe, brief, not migrainous. Does not fit the pattern of any primary headache disorder suspect medical non compliance(has not filled his blood pressure meds in 4 months)  with polypharmacy and medication overuse(opioids,benzos,nsadis,goody powders); Memory complaints and  headaches - likely due to medication overuse including prescription polypharmacy (90 hydrocodone-acetaminophen a month, temazepam and alprazolam) and daily BC and NSAID use and not taking his blood pressure medications.  - at first said he doesn't take opioids regularly(90 a month) or benzos. Called pharmacy and checked registry, he fills them every monthly. Then he says he fills but does not take them and he has 150+ vicadin at home. Concerning, discussed with NP at Dr. Sudie Bailey office. He also denies he fills his benoz but drug registry shows he filles temazepam and alprazolam monthly.  - What he does NOT fill is his blood pressure medications - last filled August . He says he is taking his sister's meds, discussed (if true) this is concerning and dangerous. Most likely scenario is he is not taking his BP meds at all.  - He gets 90 vicadin, 30 temazepam and 30 alprazolam a month. I discussed how dangerous this is to patient, this can be deadly. I'm concerned and discussed with NP that a review of his medication is in order especially since he says he has >150 vicadin stocked at home   - very unusual, patient is quite scattered. There is a large component of polypharmacy, non compliance, untruthful statements, and OTC medication overuse and medical noncompliance.  I am concerned about his multiple meds. His blood pressure is sometimes significantly elevated per patient. I encouraged compliance  - Doesn't fit any primary headache disorder, no autonomic symptoms, not classic migraines and denies migraine history.  Will order MRI brain and MRA head  Orders Placed This Encounter  Procedures  . MR BRAIN W WO CONTRAST  . MR MRA HEAD WO CONTRAST  . Sedimentation rate  . C-reactive protein  . Comprehensive metabolic panel     Naomie Dean, MD  [facility] [address] [place], [place] [address]  Phone [phone] Fax [phone]

## 2018-10-01 ENCOUNTER — Encounter: Payer: Self-pay | Admitting: Neurology

## 2018-10-01 ENCOUNTER — Telehealth: Payer: Self-pay | Admitting: Neurology

## 2018-10-01 DIAGNOSIS — F1110 Opioid abuse, uncomplicated: Secondary | ICD-10-CM | POA: Insufficient documentation

## 2018-10-01 DIAGNOSIS — Z91199 Patient's noncompliance with other medical treatment and regimen due to unspecified reason: Secondary | ICD-10-CM | POA: Insufficient documentation

## 2018-10-01 DIAGNOSIS — F1310 Sedative, hypnotic or anxiolytic abuse, uncomplicated: Secondary | ICD-10-CM | POA: Insufficient documentation

## 2018-10-01 DIAGNOSIS — Z9119 Patient's noncompliance with other medical treatment and regimen: Secondary | ICD-10-CM | POA: Insufficient documentation

## 2018-10-01 DIAGNOSIS — Z79899 Other long term (current) drug therapy: Secondary | ICD-10-CM

## 2018-10-01 HISTORY — DX: Sedative, hypnotic or anxiolytic abuse, uncomplicated: F13.10

## 2018-10-01 HISTORY — DX: Patient's noncompliance with other medical treatment and regimen due to unspecified reason: Z91.199

## 2018-10-01 HISTORY — DX: Other long term (current) drug therapy: Z79.899

## 2018-10-01 HISTORY — DX: Opioid abuse, uncomplicated: F11.10

## 2018-10-01 NOTE — Telephone Encounter (Signed)
have it approved for [employer]  [employer] Auth: [id number] (exp. 10/01/18 to 10/30/18) spoke to the patient he wants to see how the medicine goes first & also has a Agricultural consultant[employer]BS nurse asigned to him that will help him find the cheapest place to have MRI.

## 2018-10-01 NOTE — Telephone Encounter (Signed)
Can you hand fax this to Dr. Addison BaileyPochnau's attention URGENT please read my concerns in assessment and plan, please to Dr. Sudie BaileyProchnau only. thanks

## 2018-10-01 NOTE — Telephone Encounter (Signed)
Message written to Dr. Sudie BaileyProchnau and faxed to her office attn: Dr. Sudie BaileyProchnau only. Received a receipt of confirmation.

## 2018-10-13 LAB — CUP PACEART REMOTE DEVICE CHECK
Date Time Interrogation Session: 20191202094030
MDC IDC PG IMPLANT DT: 20180809

## 2018-10-21 ENCOUNTER — Ambulatory Visit (INDEPENDENT_AMBULATORY_CARE_PROVIDER_SITE_OTHER): Payer: BLUE CROSS/BLUE SHIELD

## 2018-10-21 DIAGNOSIS — I480 Paroxysmal atrial fibrillation: Secondary | ICD-10-CM | POA: Diagnosis not present

## 2018-10-21 LAB — CUP PACEART REMOTE DEVICE CHECK
Date Time Interrogation Session: 20200104093552
Implantable Pulse Generator Implant Date: 20180809

## 2018-10-22 NOTE — Progress Notes (Signed)
[employer] Summary Report / Loop Recorder 

## 2018-11-21 ENCOUNTER — Ambulatory Visit (INDEPENDENT_AMBULATORY_CARE_PROVIDER_SITE_OTHER): Payer: BLUE CROSS/BLUE SHIELD

## 2018-11-21 DIAGNOSIS — I480 Paroxysmal atrial fibrillation: Secondary | ICD-10-CM

## 2018-11-24 LAB — CUP PACEART REMOTE DEVICE CHECK
Date Time Interrogation Session: 20200206124125
Implantable Pulse Generator Implant Date: 20180809

## 2018-12-02 ENCOUNTER — Ambulatory Visit: Payer: BLUE CROSS/BLUE SHIELD

## 2018-12-02 NOTE — Progress Notes (Signed)
[employer] Summary Report / Loop Recorder 

## 2018-12-04 LAB — CUP PACEART REMOTE DEVICE CHECK
Date Time Interrogation Session: 20200218143142
Implantable Pulse Generator Implant Date: 20180809

## 2018-12-24 ENCOUNTER — Ambulatory Visit (INDEPENDENT_AMBULATORY_CARE_PROVIDER_SITE_OTHER): Payer: BLUE CROSS/BLUE SHIELD | Admitting: *Deleted

## 2018-12-24 DIAGNOSIS — I480 Paroxysmal atrial fibrillation: Secondary | ICD-10-CM | POA: Diagnosis not present

## 2018-12-25 ENCOUNTER — Other Ambulatory Visit: Payer: Self-pay

## 2018-12-25 LAB — CUP PACEART REMOTE DEVICE CHECK
Date Time Interrogation Session: 20200310124301
Implantable Pulse Generator Implant Date: 20180809

## 2018-12-31 NOTE — Progress Notes (Signed)
[employer] Summary Report / Loop Recorder 

## 2019-01-16 ENCOUNTER — Telehealth: Payer: Self-pay

## 2019-01-16 ENCOUNTER — Telehealth: Payer: Self-pay | Admitting: Cardiology

## 2019-01-16 NOTE — Telephone Encounter (Signed)
I left a message for the pt letting him know he do not need to send a manual transmission everyday.

## 2019-01-16 NOTE — Telephone Encounter (Signed)
Spoke w/ pt and instructed him not to send manual transmissions everyday b/c his home monitor is automatic. Pt stated that he isn't pressing the button but he'll move it up a little higher in case his grandson is pressing the button.

## 2019-01-27 ENCOUNTER — Ambulatory Visit (INDEPENDENT_AMBULATORY_CARE_PROVIDER_SITE_OTHER): Payer: BLUE CROSS/BLUE SHIELD | Admitting: *Deleted

## 2019-01-27 ENCOUNTER — Other Ambulatory Visit: Payer: Self-pay

## 2019-01-27 DIAGNOSIS — I480 Paroxysmal atrial fibrillation: Secondary | ICD-10-CM | POA: Diagnosis not present

## 2019-01-27 LAB — CUP PACEART REMOTE DEVICE CHECK
Date Time Interrogation Session: 20200412133843
Implantable Pulse Generator Implant Date: 20180809

## 2019-02-06 NOTE — Progress Notes (Signed)
[employer] Summary Report / Loop Recorder 

## 2019-02-28 ENCOUNTER — Ambulatory Visit (INDEPENDENT_AMBULATORY_CARE_PROVIDER_SITE_OTHER): Payer: BLUE CROSS/BLUE SHIELD | Admitting: *Deleted

## 2019-02-28 ENCOUNTER — Other Ambulatory Visit: Payer: Self-pay

## 2019-02-28 DIAGNOSIS — I480 Paroxysmal atrial fibrillation: Secondary | ICD-10-CM

## 2019-02-28 LAB — CUP PACEART REMOTE DEVICE CHECK
Date Time Interrogation Session: 20200515134144
Implantable Pulse Generator Implant Date: 20180809

## 2019-03-03 NOTE — Progress Notes (Signed)
[employer] Summary Report / Loop Recorder 

## 2019-04-02 ENCOUNTER — Ambulatory Visit (INDEPENDENT_AMBULATORY_CARE_PROVIDER_SITE_OTHER): Payer: BC Managed Care – PPO | Admitting: *Deleted

## 2019-04-02 DIAGNOSIS — I480 Paroxysmal atrial fibrillation: Secondary | ICD-10-CM | POA: Diagnosis not present

## 2019-04-02 LAB — CUP PACEART REMOTE DEVICE CHECK
Date Time Interrogation Session: 20200617134417
Implantable Pulse Generator Implant Date: 20180809

## 2019-04-14 NOTE — Progress Notes (Signed)
[employer] Summary Report / Loop Recorder 

## 2019-04-21 DIAGNOSIS — L02415 Cutaneous abscess of right lower limb: Secondary | ICD-10-CM | POA: Diagnosis not present

## 2019-05-02 ENCOUNTER — Telehealth: Payer: Self-pay

## 2019-05-02 NOTE — Telephone Encounter (Signed)
Left message for patient regarding disconnected monitor.  

## 2019-05-05 ENCOUNTER — Ambulatory Visit (INDEPENDENT_AMBULATORY_CARE_PROVIDER_SITE_OTHER): Payer: BC Managed Care – PPO | Admitting: *Deleted

## 2019-05-05 DIAGNOSIS — I480 Paroxysmal atrial fibrillation: Secondary | ICD-10-CM | POA: Diagnosis not present

## 2019-05-05 LAB — CUP PACEART REMOTE DEVICE CHECK
Date Time Interrogation Session: 20200720154021
Implantable Pulse Generator Implant Date: 20180809

## 2019-05-19 NOTE — Progress Notes (Signed)
[employer] Summary Report / Loop Recorder 

## 2019-06-09 ENCOUNTER — Encounter: Payer: BC Managed Care – PPO | Admitting: *Deleted

## 2019-07-09 ENCOUNTER — Telehealth (INDEPENDENT_AMBULATORY_CARE_PROVIDER_SITE_OTHER): Payer: BC Managed Care – PPO | Admitting: Internal Medicine

## 2019-07-09 ENCOUNTER — Telehealth: Payer: Self-pay

## 2019-07-09 ENCOUNTER — Other Ambulatory Visit: Payer: Self-pay

## 2019-07-09 DIAGNOSIS — I480 Paroxysmal atrial fibrillation: Secondary | ICD-10-CM

## 2019-07-09 DIAGNOSIS — I10 Essential (primary) hypertension: Secondary | ICD-10-CM | POA: Diagnosis not present

## 2019-07-09 NOTE — Telephone Encounter (Signed)
LINQ manual transmission received today. Summary Report will be automatically generated 07/10/19 per Carelink. No symptom, tachy, pause, or brady episodes. 3 "AF" episodes--ECGs show SR w/artifact and ectopy. No true AF noted.

## 2019-07-09 NOTE — Telephone Encounter (Signed)
Transmission received.

## 2019-07-09 NOTE — Telephone Encounter (Signed)
-----   Message from Thompson Grayer, MD sent at 07/09/2019 10:26 AM EDT ----- Last received remote was from July. I have encouraged him to send a manual transmission today.  Please try to see why his remotes are not uptodate.

## 2019-07-09 NOTE — Progress Notes (Signed)
Electrophysiology TeleHealth Note  Due to national recommendations of social distancing due to [place] 19, an audio telehealth visit is felt to be most appropriate for this patient at this time.  Verbal consent was obtained by me for the telehealth visit today.  The patient does not have capability for a virtual visit.  A phone visit is therefore required today.   Date:  07/09/2019   ID:  Wayne Pollard, DOB Jun 17, 1966, MRN [id number]  Location: patient's home  Provider location:  [facility]  Evaluation Performed: Follow-up visit  PCP:  Ernestene Kiel, MD   Electrophysiologist:  Dr Rayann Heman  Chief Complaint:  palpitations  History of Present Illness:    Wayne Pollard is a 53 y.o. male who presents via telehealth conferencing today.  Since last being seen in our clinic, the patient reports doing very well.  Today, he denies symptoms of palpitations, chest pain, shortness of breath,  lower extremity edema, dizziness, presyncope, or syncope.  The patient is otherwise without complaint today.  The patient denies symptoms of fevers, chills, cough, or new SOB worrisome for COVID 19.  Past Medical History:  Diagnosis Date  . Anxiety   . Bradycardia    a. 09/2013 adm - pindolol discontinued.  . Cervical disc disease 02/10/2016  . Chest pain    a. 04/2010 Cath: essentially nl cors, nl EF. Myoview low risk Aug 2016  . Hypertension   . Paroxysmal atrial fibrillation ([place])    a. previously failed flecainide/dilt/lopressor;  b. 08/2013 TEE: EF 55-60%, no rwma, no LAA thrombus or veg;  c. s/p PVI and afib ablation 08/2013;  d. chronic xarelto.  . Syncope    a. 09/2013 adm - not clear if related to bradycardia. Suspected orthostasis.    Past Surgical History:  Procedure Laterality Date  . APPENDECTOMY    . ATRIAL FIBRILLATION ABLATION N/A 08/19/2013   Procedure: ATRIAL FIBRILLATION ABLATION;  Surgeon: Coralyn Mark, MD;  Location: [facility] CATH LAB;  Service: Cardiovascular;  Laterality:  N/A;  . CARDIAC CATHETERIZATION  2011   [facility], Pt reports no CAD  . CERVICAL LAMINECTOMY    . CHOLECYSTECTOMY    . ELECTROPHYSIOLOGIC STUDY N/A 11/14/2016   Procedure: Atrial Fibrillation Ablation;  Surgeon: Thompson Grayer, MD;  Location: [facility] CV LAB;  Service: Cardiovascular;  Laterality: N/A;  . HERNIA REPAIR    . KNEE ARTHROSCOPY Bilateral   . LOOP RECORDER INSERTION N/A 05/24/2017   Procedure: LOOP RECORDER INSERTION;  Surgeon: Thompson Grayer, MD;  Location: [facility] CV LAB;  Service: Cardiovascular;  Laterality: N/A;  . TEE WITHOUT CARDIOVERSION N/A 08/18/2013   Procedure: TRANSESOPHAGEAL ECHOCARDIOGRAM (TEE);  Surgeon: Lelon Perla, MD;  Location: [facility] ENDOSCOPY;  Service: Cardiovascular;  Laterality: N/A;  . WRIST SURGERY Bilateral     Current Outpatient Medications  Medication Sig Dispense Refill  . ALPRAZolam (XANAX) 0.5 MG tablet Take 0.5 mg by mouth 2 (two) times daily as needed for anxiety.     . Aspirin-Acetaminophen-Caffeine (GOODY HEADACHE PO) Take 1 packet by mouth daily as needed (headaches).    . esomeprazole (NEXIUM) 40 MG capsule Take 40 mg by mouth daily at 12 noon.    Marland Kitchen HYDROcodone-acetaminophen (NORCO) 10-325 MG tablet Take 1 tablet by mouth 3 (three) times daily as needed for severe pain.    . nitroGLYCERIN (NITROSTAT) 0.4 MG SL tablet Place 1 tablet (0.4 mg total) under the tongue every 5 (five) minutes as needed for chest pain. 90 tablet  3  . temazepam (RESTORIL) 30 MG capsule Take 30 mg by mouth at bedtime.    . Tetrahydrozoline HCl (VISINE OP) Apply 1 drop to eye daily.    Marland Kitchen Ustekinumab (STELARA [place]) Inject 1 Dose into the skin every 3 (three) months.     No current facility-administered medications for this visit.     Allergies:   Codeine   Social History:  The patient  reports that he has been smoking cigars. He has been smoking about 3.00 packs per day. He has never used smokeless tobacco. He reports that he does not drink alcohol or  use drugs.   Family History:  The patient's  family history includes Heart attack in his father; Heart failure in his father; Hypertension in his mother; Stroke in his father, paternal grandfather, and paternal uncle.   ROS:  Please see the history of present illness.   All other systems are personally reviewed and negative.    Exam:    Vital Signs:  There were no vitals taken for this visit.  Well sounding, alert and conversant   Labs/Other Tests and Data Reviewed:    Recent Labs: No results found for requested labs within last 8760 hours.   Wt Readings from Last 3 Encounters:  09/30/18 204 lb (92.5 kg)  01/21/18 212 lb (96.2 kg)  05/24/17 220 lb (99.8 kg)     ASSESSMENT & PLAN:    1.  afib Well controlled ILR is reviewed chads2vasc score is 1.  He is not on anticoagulation  2. HTN Stable No change required today  Follow-up:  remotes Return to see me in a year   Patient Risk:  after full review of this patients clinical status, I feel that they are at moderate risk at this time.  Today, I have spent 15 minutes with the patient with telehealth technology discussing arrhythmia management .    Randolm Idol, MD  07/09/2019 10:21 AM     [facility] [address] [place] [address] [phone] (office) [phone] (fax)

## 2019-07-10 ENCOUNTER — Ambulatory Visit (INDEPENDENT_AMBULATORY_CARE_PROVIDER_SITE_OTHER): Payer: BC Managed Care – PPO | Admitting: *Deleted

## 2019-07-10 DIAGNOSIS — I480 Paroxysmal atrial fibrillation: Secondary | ICD-10-CM | POA: Diagnosis not present

## 2019-07-10 LAB — CUP PACEART REMOTE DEVICE CHECK
Date Time Interrogation Session: 20200924162103
Implantable Pulse Generator Implant Date: 20180809

## 2019-07-17 NOTE — Progress Notes (Signed)
[employer] Summary Report / Loop Recorder 

## 2019-07-30 DIAGNOSIS — R05 Cough: Secondary | ICD-10-CM | POA: Diagnosis not present

## 2019-07-30 DIAGNOSIS — Z20828 Contact with and (suspected) exposure to other viral communicable diseases: Secondary | ICD-10-CM | POA: Diagnosis not present

## 2019-07-30 DIAGNOSIS — R519 Headache, unspecified: Secondary | ICD-10-CM | POA: Diagnosis not present

## 2019-07-30 DIAGNOSIS — J029 Acute pharyngitis, unspecified: Secondary | ICD-10-CM | POA: Diagnosis not present

## 2019-08-12 ENCOUNTER — Ambulatory Visit (INDEPENDENT_AMBULATORY_CARE_PROVIDER_SITE_OTHER): Payer: BC Managed Care – PPO | Admitting: *Deleted

## 2019-08-12 DIAGNOSIS — I480 Paroxysmal atrial fibrillation: Secondary | ICD-10-CM

## 2019-08-13 LAB — CUP PACEART REMOTE DEVICE CHECK
Date Time Interrogation Session: 20201027181056
Implantable Pulse Generator Implant Date: 20180809

## 2019-09-02 NOTE — Progress Notes (Signed)
[employer] Summary Report / Loop Recorder 

## 2019-09-15 ENCOUNTER — Encounter: Payer: BC Managed Care – PPO | Admitting: *Deleted

## 2019-10-20 ENCOUNTER — Telehealth: Payer: Self-pay

## 2019-10-20 NOTE — Telephone Encounter (Signed)
I told the pt his monitor sent an Alert for Afib but it only sent a short picture. I asked him to send a manual transmission with his home monitor. He states he is not at home but will send one when he get home.

## 2019-10-21 NOTE — Telephone Encounter (Signed)
Pt states when he tried to send the transmission his monitor would do it half way and stop. He put the handset back on the monitor to charge. I gave the pt the number to [facility] tech support in case his monitor give him the same issue.

## 2019-10-22 ENCOUNTER — Telehealth: Payer: Self-pay | Admitting: Emergency Medicine

## 2019-10-22 NOTE — Telephone Encounter (Signed)
Alerts have been received for false AF that appears to be SR with PVCs. Patient will come in for programming changes on 10/30/19. He will call office to reschedule appointment if his work schedule changes.

## 2019-10-23 DIAGNOSIS — L4050 Arthropathic psoriasis, unspecified: Secondary | ICD-10-CM | POA: Diagnosis not present

## 2019-10-23 DIAGNOSIS — L400 Psoriasis vulgaris: Secondary | ICD-10-CM | POA: Diagnosis not present

## 2019-10-23 NOTE — Telephone Encounter (Signed)
Transmission received 03-20-2020

## 2019-10-24 NOTE — Telephone Encounter (Signed)
"  AF" episodes false, appear SR w/ ectopy. Scheduled for LINQ reprogramming on 10/30/19.

## 2019-11-19 ENCOUNTER — Ambulatory Visit (INDEPENDENT_AMBULATORY_CARE_PROVIDER_SITE_OTHER): Payer: BC Managed Care – PPO | Admitting: *Deleted

## 2019-11-19 DIAGNOSIS — I480 Paroxysmal atrial fibrillation: Secondary | ICD-10-CM | POA: Diagnosis not present

## 2019-11-19 LAB — CUP PACEART REMOTE DEVICE CHECK
Date Time Interrogation Session: 20210203133153
Implantable Pulse Generator Implant Date: 20180808200000

## 2019-11-20 NOTE — Progress Notes (Signed)
ILR Remote 

## 2019-12-22 ENCOUNTER — Ambulatory Visit (INDEPENDENT_AMBULATORY_CARE_PROVIDER_SITE_OTHER): Payer: BC Managed Care – PPO | Admitting: *Deleted

## 2019-12-22 DIAGNOSIS — I480 Paroxysmal atrial fibrillation: Secondary | ICD-10-CM

## 2019-12-22 LAB — CUP PACEART REMOTE DEVICE CHECK
Date Time Interrogation Session: 20210308023337
Implantable Pulse Generator Implant Date: 20180809

## 2019-12-22 NOTE — Progress Notes (Signed)
ILR Remote 

## 2020-01-06 ENCOUNTER — Telehealth: Payer: Self-pay

## 2020-01-06 NOTE — Telephone Encounter (Signed)
Spoke with pt, remote transmission received.  Presenting HR WNL.  Pt indicates he is still feeling quite dizzy.  Dizziness started upon waking today.  He has not taken any medications today, all meds that he takes are PRN.  Last noted episode was 3/17 and it appears to be noise.  Advised pt that the dizziness does not appear to be coming from HR and he should be assessed by PCP for current acute condition.

## 2020-01-06 NOTE — Telephone Encounter (Signed)
Patient returning call.

## 2020-01-06 NOTE — Telephone Encounter (Signed)
Remote transmission received noting 3 new AF episodes, no details avail.  Called pt to request manual transmission.  Upon answering phone pt stated he was not feeling well, having dizziness and SOB. Pt states he has also felt very fatigued lately, like he has not slept at all.   Attempted to walk pt through sending a manual transmission, received error message 3230 indicating handheld device needed to be charged. Advised to put handheld device in cradle and make sure the charging light comes on screen.  Asked if pt home alone,  Pt responded that he was not home alone, recommended he inform his family of how he was feeling, lay dawn elevate feet if he should experience chest pain/ tightness or any other new or worsening  symptoms call 911.  Advised pt I would callback in 20 minutes-1/2 hour to assist with manual send once battery on hand held device has charged.   1610- Attempted callback x3, no answer, VM is full/ not accepting messages.  Attempted to call pt spouse (DPR) on file. Phone listed as invalid.   9604- Attempted callback again, no answer, VM is still full

## 2020-01-08 DIAGNOSIS — Z114 Encounter for screening for human immunodeficiency virus [HIV]: Secondary | ICD-10-CM | POA: Diagnosis not present

## 2020-01-08 DIAGNOSIS — L400 Psoriasis vulgaris: Secondary | ICD-10-CM | POA: Diagnosis not present

## 2020-01-08 DIAGNOSIS — R531 Weakness: Secondary | ICD-10-CM | POA: Diagnosis not present

## 2020-01-22 ENCOUNTER — Ambulatory Visit (INDEPENDENT_AMBULATORY_CARE_PROVIDER_SITE_OTHER): Payer: BC Managed Care – PPO | Admitting: *Deleted

## 2020-01-22 DIAGNOSIS — I480 Paroxysmal atrial fibrillation: Secondary | ICD-10-CM

## 2020-01-22 NOTE — Progress Notes (Signed)
ILR Remote 

## 2020-02-11 ENCOUNTER — Telehealth: Payer: Self-pay

## 2020-02-11 NOTE — Telephone Encounter (Signed)
Spoke with patient to inform of disconnected monitor. °

## 2020-03-16 ENCOUNTER — Telehealth: Payer: Self-pay

## 2020-03-16 NOTE — Telephone Encounter (Signed)
Carelink alert received 03/13/2020 at 12:05 AM for 10 AF events appear 2 min duration. available EGM irregular R-R intervals w/ VR 130's. Appear SR w/ ectopy. No OAC noted.  Patient has hx of same, difficult to decipher. Routing for any recomendations to Dr. Johney Frame.

## 2020-03-29 ENCOUNTER — Ambulatory Visit (INDEPENDENT_AMBULATORY_CARE_PROVIDER_SITE_OTHER): Payer: BC Managed Care – PPO | Admitting: *Deleted

## 2020-03-29 DIAGNOSIS — I480 Paroxysmal atrial fibrillation: Secondary | ICD-10-CM

## 2020-03-29 LAB — CUP PACEART REMOTE DEVICE CHECK
Date Time Interrogation Session: 20210613232820
Implantable Pulse Generator Implant Date: 20180809

## 2020-03-31 NOTE — Progress Notes (Signed)
[employer] Summary Report / Loop Recorder 

## 2020-05-03 ENCOUNTER — Ambulatory Visit (INDEPENDENT_AMBULATORY_CARE_PROVIDER_SITE_OTHER): Payer: BC Managed Care – PPO | Admitting: *Deleted

## 2020-05-03 DIAGNOSIS — I480 Paroxysmal atrial fibrillation: Secondary | ICD-10-CM | POA: Diagnosis not present

## 2020-05-03 LAB — CUP PACEART REMOTE DEVICE CHECK
Date Time Interrogation Session: 20210718230542
Implantable Pulse Generator Implant Date: 20180809

## 2020-05-05 NOTE — Progress Notes (Signed)
[employer] Summary Report / Loop Recorder 

## 2020-05-10 ENCOUNTER — Telehealth: Payer: Self-pay

## 2020-05-10 NOTE — Telephone Encounter (Signed)
Carelink alert received 05/10/20 for possible AF episode. No episodes noted since Mar 09, 2020. Called patient to see if he can send a manual transmission. States he will send it in about 15 minutes.

## 2020-05-17 NOTE — Telephone Encounter (Signed)
The pt states his monitor is not working. I gave him the number to [facility] tech support to get help with the monitor. I also gave him my direct office number to call to let me know what tech support says. The pt verbalized understanding.

## 2020-05-20 NOTE — Telephone Encounter (Signed)
Spoke to patient regarding home monitor. States he called [employer], they were going to call him back but patient had to go out of town. States he will be back this evening and will call [employer]. Requested patient call us back at [phone] to give [place] an update on monitor. [employer] support number provided to patient.

## 2020-05-25 NOTE — Telephone Encounter (Signed)
Attempted to call patient, no answer, VM is full/ not accepting messages.

## 2020-05-27 NOTE — Telephone Encounter (Signed)
Spoke with pt.  He took [employer] tech support number and will call right now for assistance with transmission.

## 2020-05-31 ENCOUNTER — Telehealth: Payer: Self-pay | Admitting: Emergency Medicine

## 2020-05-31 NOTE — Telephone Encounter (Signed)
LMOM to send home remote transmission. Need complete transmission due to alert for multiple episodes of AF. DC # and office hours provided if patient needs assistance with transmission.

## 2020-06-02 NOTE — Telephone Encounter (Signed)
Called patient to assist with transmission.   3248- Code reading on monitor.   Patient given [employer] tech support number [phone] to call for assistance.    Patient agreeable to plan.

## 2020-06-07 ENCOUNTER — Ambulatory Visit (INDEPENDENT_AMBULATORY_CARE_PROVIDER_SITE_OTHER): Payer: BC Managed Care – PPO | Admitting: *Deleted

## 2020-06-07 DIAGNOSIS — I480 Paroxysmal atrial fibrillation: Secondary | ICD-10-CM | POA: Diagnosis not present

## 2020-06-07 LAB — CUP PACEART REMOTE DEVICE CHECK
Date Time Interrogation Session: 20210820230934
Implantable Pulse Generator Implant Date: 20180809

## 2020-06-10 NOTE — Progress Notes (Signed)
[employer] Summary Report / Loop Recorder 

## 2020-06-16 NOTE — Telephone Encounter (Signed)
Per DPR message left for patient to call office to give update on status of his monitor. Need remote transmission due to 05/31/20 alert. Device clinic hours and # provided.

## 2020-06-18 NOTE — Telephone Encounter (Signed)
Patient states he called [employer], was on the phone over 2 hours and could not get anything fix. States he ran out of time on the phone. Patient states he had an emergency in [place] and has not been able to call [employer] back, he just got back in town. Encouraged patient to call [employer] over the weekend to see if they are able to help him with his monitor or possibly send him a new monitor. Discussed with patient how important it is for him to be monitored with home remotes. Patient verbalizes understanding and agreeable to plan.

## 2020-06-22 NOTE — Telephone Encounter (Signed)
LMOVM for pt to return my call.  

## 2020-07-08 ENCOUNTER — Telehealth: Payer: Self-pay

## 2020-07-08 NOTE — Telephone Encounter (Signed)
Contacted [employer] via email to request patient have new device mailed to his house. Patient unable to remember the exact code when attempting to send transmission. Patient states after he places the receiver back on the base, an orange lights appears on the screen. Advised patient I will email [employer]. [employer] stated patient had no record on file or patient calling.  Patient new address. [address] [place], [place] [address]  email sent to [email or web address] requesting for assistance

## 2020-07-09 NOTE — Telephone Encounter (Signed)
Please see phone encounter from 07/08/20.

## 2020-08-19 DIAGNOSIS — M549 Dorsalgia, unspecified: Secondary | ICD-10-CM | POA: Diagnosis not present

## 2020-08-19 DIAGNOSIS — M4316 Spondylolisthesis, lumbar region: Secondary | ICD-10-CM | POA: Diagnosis not present

## 2020-08-19 DIAGNOSIS — M5136 Other intervertebral disc degeneration, lumbar region: Secondary | ICD-10-CM | POA: Diagnosis not present

## 2020-09-13 DIAGNOSIS — R296 Repeated falls: Secondary | ICD-10-CM | POA: Diagnosis not present

## 2020-09-13 DIAGNOSIS — R531 Weakness: Secondary | ICD-10-CM | POA: Diagnosis not present

## 2020-09-13 DIAGNOSIS — R200 Anesthesia of skin: Secondary | ICD-10-CM | POA: Diagnosis not present

## 2020-09-13 DIAGNOSIS — I4891 Unspecified atrial fibrillation: Secondary | ICD-10-CM | POA: Diagnosis not present

## 2020-09-13 DIAGNOSIS — J9811 Atelectasis: Secondary | ICD-10-CM | POA: Diagnosis not present

## 2020-09-13 DIAGNOSIS — I517 Cardiomegaly: Secondary | ICD-10-CM | POA: Diagnosis not present

## 2020-09-14 DIAGNOSIS — Z9114 Patient's other noncompliance with medication regimen: Secondary | ICD-10-CM | POA: Diagnosis not present

## 2020-09-14 DIAGNOSIS — I639 Cerebral infarction, unspecified: Secondary | ICD-10-CM | POA: Diagnosis not present

## 2020-09-14 DIAGNOSIS — R200 Anesthesia of skin: Secondary | ICD-10-CM | POA: Diagnosis not present

## 2020-09-14 DIAGNOSIS — R4781 Slurred speech: Secondary | ICD-10-CM | POA: Diagnosis not present

## 2020-09-14 DIAGNOSIS — F17210 Nicotine dependence, cigarettes, uncomplicated: Secondary | ICD-10-CM | POA: Diagnosis not present

## 2020-09-14 DIAGNOSIS — E876 Hypokalemia: Secondary | ICD-10-CM | POA: Diagnosis not present

## 2020-09-14 DIAGNOSIS — G8191 Hemiplegia, unspecified affecting right dominant side: Secondary | ICD-10-CM | POA: Diagnosis not present

## 2020-09-14 DIAGNOSIS — I517 Cardiomegaly: Secondary | ICD-10-CM | POA: Diagnosis not present

## 2020-09-14 DIAGNOSIS — Z79899 Other long term (current) drug therapy: Secondary | ICD-10-CM | POA: Diagnosis not present

## 2020-09-14 DIAGNOSIS — R296 Repeated falls: Secondary | ICD-10-CM | POA: Diagnosis not present

## 2020-09-14 DIAGNOSIS — F1590 Other stimulant use, unspecified, uncomplicated: Secondary | ICD-10-CM | POA: Diagnosis not present

## 2020-09-14 DIAGNOSIS — I480 Paroxysmal atrial fibrillation: Secondary | ICD-10-CM | POA: Diagnosis not present

## 2020-09-14 DIAGNOSIS — I10 Essential (primary) hypertension: Secondary | ICD-10-CM | POA: Diagnosis not present

## 2020-09-14 DIAGNOSIS — R531 Weakness: Secondary | ICD-10-CM | POA: Diagnosis not present

## 2020-09-14 DIAGNOSIS — K219 Gastro-esophageal reflux disease without esophagitis: Secondary | ICD-10-CM | POA: Diagnosis not present

## 2020-09-14 DIAGNOSIS — G8194 Hemiplegia, unspecified affecting left nondominant side: Secondary | ICD-10-CM | POA: Diagnosis not present

## 2020-09-14 DIAGNOSIS — F1290 Cannabis use, unspecified, uncomplicated: Secondary | ICD-10-CM | POA: Diagnosis not present

## 2020-09-14 DIAGNOSIS — R29703 NIHSS score 3: Secondary | ICD-10-CM | POA: Diagnosis not present

## 2020-09-28 DIAGNOSIS — I69322 Dysarthria following cerebral infarction: Secondary | ICD-10-CM | POA: Diagnosis not present

## 2020-10-25 ENCOUNTER — Ambulatory Visit (INDEPENDENT_AMBULATORY_CARE_PROVIDER_SITE_OTHER): Payer: BC Managed Care – PPO

## 2020-10-25 DIAGNOSIS — I480 Paroxysmal atrial fibrillation: Secondary | ICD-10-CM | POA: Diagnosis not present

## 2020-10-25 LAB — CUP PACEART REMOTE DEVICE CHECK
Date Time Interrogation Session: 20220108231000
Implantable Pulse Generator Implant Date: 20180809

## 2020-11-07 NOTE — Progress Notes (Signed)
[employer] Summary Report / Loop Recorder 

## 2020-11-27 LAB — CUP PACEART REMOTE DEVICE CHECK
Date Time Interrogation Session: 20220210232923
Implantable Pulse Generator Implant Date: 20180809

## 2020-11-29 ENCOUNTER — Ambulatory Visit (INDEPENDENT_AMBULATORY_CARE_PROVIDER_SITE_OTHER): Payer: BC Managed Care – PPO

## 2020-11-29 DIAGNOSIS — I480 Paroxysmal atrial fibrillation: Secondary | ICD-10-CM | POA: Diagnosis not present

## 2020-12-02 NOTE — Progress Notes (Signed)
[employer] Summary Report / Loop Recorder 

## 2020-12-30 LAB — CUP PACEART REMOTE DEVICE CHECK
Date Time Interrogation Session: 20220316003106
Implantable Pulse Generator Implant Date: 20180809

## 2021-01-03 ENCOUNTER — Ambulatory Visit (INDEPENDENT_AMBULATORY_CARE_PROVIDER_SITE_OTHER): Payer: BC Managed Care – PPO

## 2021-01-03 DIAGNOSIS — I480 Paroxysmal atrial fibrillation: Secondary | ICD-10-CM

## 2021-01-10 NOTE — Progress Notes (Unsigned)
Carelink Summary Report / Loop Recorder 

## 2021-01-13 DIAGNOSIS — L400 Psoriasis vulgaris: Secondary | ICD-10-CM | POA: Diagnosis not present

## 2021-03-08 ENCOUNTER — Telehealth: Payer: Self-pay | Admitting: Emergency Medicine

## 2021-03-08 NOTE — Telephone Encounter (Signed)
Patient met [facility] on 02/04/21. Attempted to contact patient, no answer unable to leave message.  Appointments cancelled in [place]. Patient status changed in Carelink. Return kit ordered. Will re attempt to contact patient.

## 2021-03-11 NOTE — Telephone Encounter (Signed)
Patient notified loop recorder at [facility]. Patient does not wish to have loop extraction at this time and will contact the office if he changes his mind to schedule appointment for extraction. Instructed to unplug home monitor and informed return ki has been ordered and should arrive in the mail to send home monitor back to [employer] at no cost to him.

## 2021-05-05 DIAGNOSIS — I639 Cerebral infarction, unspecified: Secondary | ICD-10-CM | POA: Diagnosis not present

## 2021-05-05 DIAGNOSIS — I10 Essential (primary) hypertension: Secondary | ICD-10-CM | POA: Diagnosis not present

## 2021-05-05 DIAGNOSIS — K219 Gastro-esophageal reflux disease without esophagitis: Secondary | ICD-10-CM | POA: Diagnosis not present

## 2021-05-05 DIAGNOSIS — R5382 Chronic fatigue, unspecified: Secondary | ICD-10-CM | POA: Diagnosis not present

## 2021-05-05 DIAGNOSIS — I480 Paroxysmal atrial fibrillation: Secondary | ICD-10-CM | POA: Diagnosis not present

## 2021-05-10 DIAGNOSIS — I10 Essential (primary) hypertension: Secondary | ICD-10-CM | POA: Diagnosis not present

## 2021-05-10 DIAGNOSIS — R7989 Other specified abnormal findings of blood chemistry: Secondary | ICD-10-CM | POA: Diagnosis not present

## 2021-05-10 DIAGNOSIS — I480 Paroxysmal atrial fibrillation: Secondary | ICD-10-CM | POA: Diagnosis not present

## 2021-05-10 DIAGNOSIS — I639 Cerebral infarction, unspecified: Secondary | ICD-10-CM | POA: Diagnosis not present

## 2021-05-10 DIAGNOSIS — Z1331 Encounter for screening for depression: Secondary | ICD-10-CM | POA: Diagnosis not present

## 2021-05-10 DIAGNOSIS — Z87891 Personal history of nicotine dependence: Secondary | ICD-10-CM | POA: Diagnosis not present

## 2021-05-24 DIAGNOSIS — I480 Paroxysmal atrial fibrillation: Secondary | ICD-10-CM | POA: Diagnosis not present

## 2021-05-24 DIAGNOSIS — B181 Chronic viral hepatitis B without delta-agent: Secondary | ICD-10-CM | POA: Diagnosis not present

## 2021-05-24 DIAGNOSIS — Z87891 Personal history of nicotine dependence: Secondary | ICD-10-CM | POA: Diagnosis not present

## 2021-05-24 DIAGNOSIS — I639 Cerebral infarction, unspecified: Secondary | ICD-10-CM | POA: Diagnosis not present

## 2021-05-24 DIAGNOSIS — I10 Essential (primary) hypertension: Secondary | ICD-10-CM | POA: Diagnosis not present

## 2021-05-25 ENCOUNTER — Ambulatory Visit: Payer: BC Managed Care – PPO | Admitting: Internal Medicine

## 2021-06-07 ENCOUNTER — Other Ambulatory Visit: Payer: Self-pay | Admitting: Nurse Practitioner

## 2021-06-07 DIAGNOSIS — R748 Abnormal levels of other serum enzymes: Secondary | ICD-10-CM

## 2021-06-07 DIAGNOSIS — B169 Acute hepatitis B without delta-agent and without hepatic coma: Secondary | ICD-10-CM

## 2021-06-23 ENCOUNTER — Other Ambulatory Visit: Payer: BC Managed Care – PPO

## 2021-06-30 ENCOUNTER — Other Ambulatory Visit: Payer: BC Managed Care – PPO

## 2021-07-18 ENCOUNTER — Other Ambulatory Visit: Payer: BC Managed Care – PPO

## 2021-07-21 ENCOUNTER — Other Ambulatory Visit: Payer: BC Managed Care – PPO

## 2021-08-08 ENCOUNTER — Encounter: Payer: Self-pay | Admitting: *Deleted

## 2021-08-08 ENCOUNTER — Ambulatory Visit: Payer: BC Managed Care – PPO | Admitting: Internal Medicine

## 2021-08-08 ENCOUNTER — Telehealth: Payer: Self-pay | Admitting: Internal Medicine

## 2021-08-08 DIAGNOSIS — I480 Paroxysmal atrial fibrillation: Secondary | ICD-10-CM

## 2021-08-08 NOTE — Telephone Encounter (Signed)
Faxed letter

## 2021-08-08 NOTE — Telephone Encounter (Signed)
Patient needs a paper saying that he has an appt today. Please fax to Charlann Boxer Attorney at [facility] and the fax is [phone] Attn to Mr. Manson Passey

## 2021-08-11 ENCOUNTER — Telehealth: Payer: Self-pay | Admitting: Internal Medicine

## 2021-08-11 NOTE — Telephone Encounter (Signed)
Patient had swelling after stroke ~9 months ago. The swelling started to return about a week ago. Swelling is in hands, feet mostly on his right side, which is weaker after the stroke per patient. The patient has some blood in his urine last night and this morning. No burning, discomfort or frequency. Patient does not take his weight or vital signs at home. Stuttering is not new. Advised the patient to call his PCP incase he has a UTI that needs to be treated. Asked patient if he is having any trouble with his Afib, "no that's is fine, just all this other stuff." Again advise patient to reach out to  PCP with all these different symptoms.    Verbalized understanding.  Will forward to Dr. Johney Frame for further advisement.

## 2021-08-11 NOTE — Telephone Encounter (Signed)
Pt c/o swelling: STAT is pt has developed SOB within 24 hours  If swelling, where is the swelling located? hands  How much weight have you gained and in what time span? Not sure  Have you gained 3 pounds in a day or 5 pounds in a week? Not sure  Do you have a log of your daily weights (if so, list)? no  Are you currently taking a fluid pill? No, because of his liver  Are you currently SOB? no  Have you traveled recently? No   Patient's friend Jovita Gamma calling, because the patient has swelling in his hands and had blood in his urine. She says he sometimes stutters his words, but has no other symptoms. She is currently with the patient and requests the call back go to her number, because the patient does not always answer. Phone: [phone]

## 2021-10-24 DIAGNOSIS — Z87891 Personal history of nicotine dependence: Secondary | ICD-10-CM | POA: Diagnosis not present

## 2021-10-24 DIAGNOSIS — B181 Chronic viral hepatitis B without delta-agent: Secondary | ICD-10-CM | POA: Diagnosis not present

## 2021-10-24 DIAGNOSIS — I480 Paroxysmal atrial fibrillation: Secondary | ICD-10-CM | POA: Diagnosis not present

## 2021-10-24 DIAGNOSIS — I639 Cerebral infarction, unspecified: Secondary | ICD-10-CM | POA: Diagnosis not present

## 2021-10-24 DIAGNOSIS — I10 Essential (primary) hypertension: Secondary | ICD-10-CM | POA: Diagnosis not present

## 2021-10-31 DIAGNOSIS — I639 Cerebral infarction, unspecified: Secondary | ICD-10-CM | POA: Diagnosis not present

## 2021-10-31 DIAGNOSIS — B181 Chronic viral hepatitis B without delta-agent: Secondary | ICD-10-CM | POA: Diagnosis not present

## 2021-10-31 DIAGNOSIS — I480 Paroxysmal atrial fibrillation: Secondary | ICD-10-CM | POA: Diagnosis not present

## 2021-10-31 DIAGNOSIS — I10 Essential (primary) hypertension: Secondary | ICD-10-CM | POA: Diagnosis not present

## 2021-10-31 DIAGNOSIS — Z87891 Personal history of nicotine dependence: Secondary | ICD-10-CM | POA: Diagnosis not present

## 2021-11-07 ENCOUNTER — Ambulatory Visit: Payer: BC Managed Care – PPO | Admitting: Cardiology

## 2021-11-07 DIAGNOSIS — B181 Chronic viral hepatitis B without delta-agent: Secondary | ICD-10-CM | POA: Diagnosis not present

## 2021-11-07 DIAGNOSIS — I10 Essential (primary) hypertension: Secondary | ICD-10-CM | POA: Diagnosis not present

## 2021-11-07 DIAGNOSIS — I480 Paroxysmal atrial fibrillation: Secondary | ICD-10-CM | POA: Diagnosis not present

## 2021-11-07 DIAGNOSIS — Z87891 Personal history of nicotine dependence: Secondary | ICD-10-CM | POA: Diagnosis not present

## 2021-11-07 DIAGNOSIS — I639 Cerebral infarction, unspecified: Secondary | ICD-10-CM | POA: Diagnosis not present

## 2021-11-07 NOTE — Progress Notes (Deleted)
Electrophysiology Office Note   Date:  11/07/2021   ID:  Wayne Pollard, DOB 08/28/1966, MRN [id number]  PCP:  Ernestene Kiel, MD  Cardiologist:  Allred Primary Electrophysiologist:  Allard Lightsey Meredith Leeds, MD    Chief Complaint: AF   History of Present Illness: Wayne Pollard is a 56 y.o. male who is being seen today for the evaluation of AF at the request of Ernestene Kiel, MD. Presenting today for electrophysiology evaluation.  He has a history significant for hypertension, atrial fibrillation status post ablation 11/14/2016 with PVI.  He has a loop monitor implanted.  Today, he denies*** symptoms of palpitations, chest pain, shortness of breath, orthopnea, PND, lower extremity edema, claudication, dizziness, presyncope, syncope, bleeding, or neurologic sequela. The patient is tolerating medications without difficulties.    Past Medical History:  Diagnosis Date   Anxiety    Bradycardia    a. 09/2013 adm - pindolol discontinued.   Cervical disc disease 02/10/2016   Chest pain    a. 04/2010 Cath: essentially nl cors, nl EF. Myoview low risk Aug 2016   Hypertension    Paroxysmal atrial fibrillation ([place])    a. previously failed flecainide/dilt/lopressor;  b. 08/2013 TEE: EF 55-60%, no rwma, no LAA thrombus or veg;  c. s/p PVI and afib ablation 08/2013;  d. chronic xarelto.   Syncope    a. 09/2013 adm - not clear if related to bradycardia. Suspected orthostasis.   Past Surgical History:  Procedure Laterality Date   APPENDECTOMY     ATRIAL FIBRILLATION ABLATION N/A 08/19/2013   Procedure: ATRIAL FIBRILLATION ABLATION;  Surgeon: Coralyn Mark, MD;  Location: [facility];  Service: Cardiovascular;  Laterality: N/A;   CARDIAC CATHETERIZATION  2011   [facility], Pt reports no CAD   CERVICAL LAMINECTOMY     CHOLECYSTECTOMY     ELECTROPHYSIOLOGIC STUDY N/A 11/14/2016   Procedure: Atrial Fibrillation Ablation;  Surgeon: Thompson Grayer, MD;  Location: [facility];   Service: Cardiovascular;  Laterality: N/A;   HERNIA REPAIR     KNEE ARTHROSCOPY Bilateral    LOOP RECORDER INSERTION N/A 05/24/2017   Procedure: LOOP RECORDER INSERTION;  Surgeon: Thompson Grayer, MD;  Location: [facility];  Service: Cardiovascular;  Laterality: N/A;   TEE WITHOUT CARDIOVERSION N/A 08/18/2013   Procedure: TRANSESOPHAGEAL ECHOCARDIOGRAM (TEE);  Surgeon: Lelon Perla, MD;  Location: [facility] ENDOSCOPY;  Service: Cardiovascular;  Laterality: N/A;   WRIST SURGERY Bilateral      Current Outpatient Medications  Medication Sig Dispense Refill   ALPRAZolam (XANAX) 0.5 MG tablet Take 0.5 mg by mouth 2 (two) times daily as needed for anxiety.      Aspirin-Acetaminophen-Caffeine (GOODY HEADACHE PO) Take 1 packet by mouth daily as needed (headaches).     esomeprazole (NEXIUM) 40 MG capsule Take 40 mg by mouth daily at 12 noon.     HYDROcodone-acetaminophen (NORCO) 10-325 MG tablet Take 1 tablet by mouth 3 (three) times daily as needed for severe pain.     nitroGLYCERIN (NITROSTAT) 0.4 MG SL tablet Place 1 tablet (0.4 mg total) under the tongue every 5 (five) minutes as needed for chest pain. 90 tablet 3   temazepam (RESTORIL) 30 MG capsule Take 30 mg by mouth at bedtime.     Tetrahydrozoline HCl (VISINE OP) Apply 1 drop to eye daily.     Ustekinumab (STELARA [place]) Inject 1 Dose into the skin every 3 (three) months.     No current facility-administered medications for this visit.  Allergies:   Codeine   Social History:  The patient  reports that he has been smoking cigars. He has been smoking an average of 3 packs per day. He has never used smokeless tobacco. He reports that he does not drink alcohol and does not use drugs.   Family History:  The patient's family history includes Heart attack in his father; Heart failure in his father; Hypertension in his mother; Stroke in his father, paternal grandfather, and paternal uncle.    ROS:  Please see the history of present illness.    Otherwise, review of systems is positive for none.   All other systems are reviewed and negative.    PHYSICAL EXAM: VS:  There were no vitals taken for this visit. , BMI There is no height or weight on file to calculate BMI. GEN: Well nourished, well developed, in no acute distress  HEENT: normal  Neck: no JVD, carotid bruits, or masses Cardiac: ***RRR; no murmurs, rubs, or gallops,no edema  Respiratory:  clear to auscultation bilaterally, normal work of breathing GI: soft, nontender, nondistended, + BS MS: no deformity or atrophy  Skin: warm and dry, device pocket is well healed Neuro:  Strength and sensation are intact Psych: euthymic mood, full affect  EKG:  EKG {ACTION; IS/IS GI:[id number] ordered today. Personal review of the ekg ordered *** shows ***  Device interrogation is reviewed today in detail.  See PaceArt for details.   Recent Labs: No results found for requested labs within last 8760 hours.    Lipid Panel     Component Value Date/Time   CHOL 161 10/17/2016 0246   TRIG 104 10/17/2016 0246   HDL 35 (L) 10/17/2016 0246   CHOLHDL 4.6 10/17/2016 0246   VLDL 21 10/17/2016 0246   LDLCALC 105 (H) 10/17/2016 0246     Wt Readings from Last 3 Encounters:  09/30/18 204 lb (92.5 kg)  01/21/18 212 lb (96.2 kg)  05/24/17 220 lb (99.8 kg)      Other studies Reviewed: Additional studies/ records that were reviewed today include: TTE 10/25/16  Review of the above records today demonstrates:  - Left ventricle: The cavity size was normal. Wall thickness was    increased in a pattern of mild LVH. Systolic function was normal.    The estimated ejection fraction was in the range of 60% to 65%.    Wall motion was normal; there were no regional wall motion    abnormalities. Features are consistent with a pseudonormal left    ventricular filling pattern, with concomitant abnormal relaxation    and increased filling pressure (grade 2 diastolic dysfunction).  - Aortic valve:  Moderately thickened, moderately calcified    leaflets; fusion of the right-left coronary commissure.    ASSESSMENT AND PLAN:  1.  Paroxysmal atrial fibrillation: CHA2DS2-VASc of 1 and thus not anticoagulated.  He is status post ablation with PVI in 2018.  He has a Linq monitor implanted which was reviewed today.***  2.  Hypertension:***  Current medicines are reviewed at length with the patient today.   The patient {ACTIONS; HAS/DOES NOT HAVE:19233} concerns regarding his medicines.  The following changes were made today:  {NONE DEFAULTED:18576}  Labs/ tests ordered today include: *** No orders of the defined types were placed in this encounter.    Disposition:   FU with Kanylah Muench {gen number [id number] {Days to years:10300}  Signed, Adella Manolis Meredith Leeds, MD  11/07/2021 10:55 AM     [facility] [place]  [address] [place] [address] [phone] (office) [phone] (fax)

## 2021-11-09 DIAGNOSIS — M542 Cervicalgia: Secondary | ICD-10-CM | POA: Diagnosis not present

## 2021-11-09 DIAGNOSIS — S40019A Contusion of unspecified shoulder, initial encounter: Secondary | ICD-10-CM | POA: Diagnosis not present

## 2021-11-09 DIAGNOSIS — S40012A Contusion of left shoulder, initial encounter: Secondary | ICD-10-CM | POA: Diagnosis not present

## 2021-11-09 DIAGNOSIS — W010XXA Fall on same level from slipping, tripping and stumbling without subsequent striking against object, initial encounter: Secondary | ICD-10-CM | POA: Diagnosis not present

## 2021-11-09 DIAGNOSIS — S20211A Contusion of right front wall of thorax, initial encounter: Secondary | ICD-10-CM | POA: Diagnosis not present

## 2021-11-10 DIAGNOSIS — W19XXXA Unspecified fall, initial encounter: Secondary | ICD-10-CM | POA: Diagnosis not present

## 2021-11-10 DIAGNOSIS — Z043 Encounter for examination and observation following other accident: Secondary | ICD-10-CM | POA: Diagnosis not present

## 2021-11-10 DIAGNOSIS — Z7902 Long term (current) use of antithrombotics/antiplatelets: Secondary | ICD-10-CM | POA: Diagnosis not present

## 2021-11-10 DIAGNOSIS — Z8673 Personal history of transient ischemic attack (TIA), and cerebral infarction without residual deficits: Secondary | ICD-10-CM | POA: Diagnosis not present

## 2021-11-10 DIAGNOSIS — R0781 Pleurodynia: Secondary | ICD-10-CM | POA: Diagnosis not present

## 2021-11-10 DIAGNOSIS — F17210 Nicotine dependence, cigarettes, uncomplicated: Secondary | ICD-10-CM | POA: Diagnosis not present

## 2021-11-10 DIAGNOSIS — M542 Cervicalgia: Secondary | ICD-10-CM | POA: Diagnosis not present

## 2021-11-10 DIAGNOSIS — M25512 Pain in left shoulder: Secondary | ICD-10-CM | POA: Diagnosis not present

## 2021-11-11 DIAGNOSIS — S4992XA Unspecified injury of left shoulder and upper arm, initial encounter: Secondary | ICD-10-CM | POA: Diagnosis not present

## 2021-11-21 DIAGNOSIS — Z87891 Personal history of nicotine dependence: Secondary | ICD-10-CM | POA: Diagnosis not present

## 2021-11-21 DIAGNOSIS — J208 Acute bronchitis due to other specified organisms: Secondary | ICD-10-CM | POA: Diagnosis not present

## 2021-11-21 DIAGNOSIS — I480 Paroxysmal atrial fibrillation: Secondary | ICD-10-CM | POA: Diagnosis not present

## 2021-11-21 DIAGNOSIS — I10 Essential (primary) hypertension: Secondary | ICD-10-CM | POA: Diagnosis not present

## 2021-11-21 DIAGNOSIS — B9689 Other specified bacterial agents as the cause of diseases classified elsewhere: Secondary | ICD-10-CM | POA: Diagnosis not present

## 2021-12-12 DIAGNOSIS — Z87891 Personal history of nicotine dependence: Secondary | ICD-10-CM | POA: Diagnosis not present

## 2021-12-12 DIAGNOSIS — I480 Paroxysmal atrial fibrillation: Secondary | ICD-10-CM | POA: Diagnosis not present

## 2021-12-12 DIAGNOSIS — L409 Psoriasis, unspecified: Secondary | ICD-10-CM | POA: Diagnosis not present

## 2021-12-12 DIAGNOSIS — I10 Essential (primary) hypertension: Secondary | ICD-10-CM | POA: Diagnosis not present

## 2021-12-12 DIAGNOSIS — B181 Chronic viral hepatitis B without delta-agent: Secondary | ICD-10-CM | POA: Diagnosis not present

## 2021-12-27 DIAGNOSIS — I10 Essential (primary) hypertension: Secondary | ICD-10-CM | POA: Diagnosis not present

## 2021-12-27 DIAGNOSIS — I480 Paroxysmal atrial fibrillation: Secondary | ICD-10-CM | POA: Diagnosis not present

## 2021-12-27 DIAGNOSIS — Z87891 Personal history of nicotine dependence: Secondary | ICD-10-CM | POA: Diagnosis not present

## 2021-12-27 DIAGNOSIS — M25512 Pain in left shoulder: Secondary | ICD-10-CM | POA: Diagnosis not present

## 2021-12-27 DIAGNOSIS — L409 Psoriasis, unspecified: Secondary | ICD-10-CM | POA: Diagnosis not present

## 2022-01-03 DIAGNOSIS — I10 Essential (primary) hypertension: Secondary | ICD-10-CM | POA: Diagnosis not present

## 2022-01-03 DIAGNOSIS — Z87891 Personal history of nicotine dependence: Secondary | ICD-10-CM | POA: Diagnosis not present

## 2022-01-03 DIAGNOSIS — I639 Cerebral infarction, unspecified: Secondary | ICD-10-CM | POA: Diagnosis not present

## 2022-01-03 DIAGNOSIS — L409 Psoriasis, unspecified: Secondary | ICD-10-CM | POA: Diagnosis not present

## 2022-01-03 DIAGNOSIS — I480 Paroxysmal atrial fibrillation: Secondary | ICD-10-CM | POA: Diagnosis not present

## 2022-01-31 DIAGNOSIS — I10 Essential (primary) hypertension: Secondary | ICD-10-CM | POA: Diagnosis not present

## 2022-02-02 DIAGNOSIS — I639 Cerebral infarction, unspecified: Secondary | ICD-10-CM | POA: Diagnosis not present

## 2022-02-02 DIAGNOSIS — I10 Essential (primary) hypertension: Secondary | ICD-10-CM | POA: Diagnosis not present

## 2022-02-02 DIAGNOSIS — L409 Psoriasis, unspecified: Secondary | ICD-10-CM | POA: Diagnosis not present

## 2022-02-02 DIAGNOSIS — Z87891 Personal history of nicotine dependence: Secondary | ICD-10-CM | POA: Diagnosis not present

## 2022-02-02 DIAGNOSIS — I480 Paroxysmal atrial fibrillation: Secondary | ICD-10-CM | POA: Diagnosis not present

## 2022-02-16 DIAGNOSIS — I10 Essential (primary) hypertension: Secondary | ICD-10-CM | POA: Diagnosis not present

## 2022-02-28 DIAGNOSIS — I10 Essential (primary) hypertension: Secondary | ICD-10-CM | POA: Diagnosis not present

## 2022-03-05 DIAGNOSIS — R4182 Altered mental status, unspecified: Secondary | ICD-10-CM | POA: Diagnosis not present

## 2022-03-05 DIAGNOSIS — G459 Transient cerebral ischemic attack, unspecified: Secondary | ICD-10-CM | POA: Diagnosis not present

## 2022-03-05 DIAGNOSIS — R0602 Shortness of breath: Secondary | ICD-10-CM | POA: Diagnosis not present

## 2022-03-05 DIAGNOSIS — R4781 Slurred speech: Secondary | ICD-10-CM | POA: Diagnosis not present

## 2022-03-05 DIAGNOSIS — F17210 Nicotine dependence, cigarettes, uncomplicated: Secondary | ICD-10-CM | POA: Diagnosis not present

## 2022-03-05 DIAGNOSIS — R531 Weakness: Secondary | ICD-10-CM | POA: Diagnosis not present

## 2022-03-05 DIAGNOSIS — I480 Paroxysmal atrial fibrillation: Secondary | ICD-10-CM | POA: Diagnosis not present

## 2022-03-05 DIAGNOSIS — Z8673 Personal history of transient ischemic attack (TIA), and cerebral infarction without residual deficits: Secondary | ICD-10-CM | POA: Diagnosis not present

## 2022-03-05 DIAGNOSIS — R200 Anesthesia of skin: Secondary | ICD-10-CM | POA: Diagnosis not present

## 2022-03-08 DIAGNOSIS — M25512 Pain in left shoulder: Secondary | ICD-10-CM | POA: Diagnosis not present

## 2022-03-09 DIAGNOSIS — R42 Dizziness and giddiness: Secondary | ICD-10-CM | POA: Diagnosis not present

## 2022-03-09 DIAGNOSIS — I480 Paroxysmal atrial fibrillation: Secondary | ICD-10-CM | POA: Diagnosis not present

## 2022-03-09 DIAGNOSIS — Z87891 Personal history of nicotine dependence: Secondary | ICD-10-CM | POA: Diagnosis not present

## 2022-03-09 DIAGNOSIS — F321 Major depressive disorder, single episode, moderate: Secondary | ICD-10-CM | POA: Diagnosis not present

## 2022-03-09 DIAGNOSIS — L409 Psoriasis, unspecified: Secondary | ICD-10-CM | POA: Diagnosis not present

## 2022-03-14 DIAGNOSIS — I10 Essential (primary) hypertension: Secondary | ICD-10-CM | POA: Diagnosis not present

## 2022-03-15 DIAGNOSIS — M25512 Pain in left shoulder: Secondary | ICD-10-CM | POA: Diagnosis not present

## 2022-03-15 DIAGNOSIS — M19012 Primary osteoarthritis, left shoulder: Secondary | ICD-10-CM | POA: Diagnosis not present

## 2022-03-15 DIAGNOSIS — S46012A Strain of muscle(s) and tendon(s) of the rotator cuff of left shoulder, initial encounter: Secondary | ICD-10-CM | POA: Diagnosis not present

## 2022-03-17 DIAGNOSIS — G8929 Other chronic pain: Secondary | ICD-10-CM | POA: Diagnosis not present

## 2022-03-17 DIAGNOSIS — M25512 Pain in left shoulder: Secondary | ICD-10-CM | POA: Diagnosis not present

## 2022-03-20 DIAGNOSIS — E871 Hypo-osmolality and hyponatremia: Secondary | ICD-10-CM | POA: Diagnosis not present

## 2022-03-28 DIAGNOSIS — Z87891 Personal history of nicotine dependence: Secondary | ICD-10-CM | POA: Diagnosis not present

## 2022-03-28 DIAGNOSIS — F321 Major depressive disorder, single episode, moderate: Secondary | ICD-10-CM | POA: Diagnosis not present

## 2022-03-28 DIAGNOSIS — I480 Paroxysmal atrial fibrillation: Secondary | ICD-10-CM | POA: Diagnosis not present

## 2022-03-28 DIAGNOSIS — L409 Psoriasis, unspecified: Secondary | ICD-10-CM | POA: Diagnosis not present

## 2022-03-28 DIAGNOSIS — I10 Essential (primary) hypertension: Secondary | ICD-10-CM | POA: Diagnosis not present

## 2022-04-17 DIAGNOSIS — I480 Paroxysmal atrial fibrillation: Secondary | ICD-10-CM | POA: Diagnosis not present

## 2022-04-17 DIAGNOSIS — Z87891 Personal history of nicotine dependence: Secondary | ICD-10-CM | POA: Diagnosis not present

## 2022-04-17 DIAGNOSIS — I10 Essential (primary) hypertension: Secondary | ICD-10-CM | POA: Diagnosis not present

## 2022-04-17 DIAGNOSIS — F321 Major depressive disorder, single episode, moderate: Secondary | ICD-10-CM | POA: Diagnosis not present

## 2022-04-17 DIAGNOSIS — L409 Psoriasis, unspecified: Secondary | ICD-10-CM | POA: Diagnosis not present

## 2022-04-28 DIAGNOSIS — I10 Essential (primary) hypertension: Secondary | ICD-10-CM | POA: Diagnosis not present

## 2022-05-12 DIAGNOSIS — I10 Essential (primary) hypertension: Secondary | ICD-10-CM | POA: Diagnosis not present

## 2022-06-12 DIAGNOSIS — I480 Paroxysmal atrial fibrillation: Secondary | ICD-10-CM | POA: Diagnosis not present

## 2022-06-12 DIAGNOSIS — R5382 Chronic fatigue, unspecified: Secondary | ICD-10-CM | POA: Diagnosis not present

## 2022-06-12 DIAGNOSIS — I10 Essential (primary) hypertension: Secondary | ICD-10-CM | POA: Diagnosis not present

## 2022-06-12 DIAGNOSIS — L409 Psoriasis, unspecified: Secondary | ICD-10-CM | POA: Diagnosis not present

## 2022-07-19 DIAGNOSIS — I10 Essential (primary) hypertension: Secondary | ICD-10-CM | POA: Diagnosis not present

## 2022-08-03 ENCOUNTER — Ambulatory Visit: Payer: Self-pay | Admitting: Licensed Clinical Social Worker

## 2022-08-03 DIAGNOSIS — I10 Essential (primary) hypertension: Secondary | ICD-10-CM | POA: Diagnosis not present

## 2022-08-03 NOTE — Patient Outreach (Signed)
  Care Coordination   08/03/2022 Name: Wayne Pollard MRN: [id number] DOB: 1966/10/12   Care Coordination Outreach Attempts:  An unsuccessful telephone outreach was attempted today to offer the patient information about available care coordination services as a benefit of their health plan.   Follow Up Plan:  Additional outreach attempts will be made to offer the patient care coordination information and services.   Encounter Outcome:  No Answer  Care Coordination Interventions Activated:  No   Care Coordination Interventions:  No, not indicated   Milus Height, MSW, [place] Social Worker [facility] Care Management  [phone]

## 2022-08-15 ENCOUNTER — Telehealth: Payer: Self-pay | Admitting: *Deleted

## 2022-08-15 NOTE — Patient Outreach (Signed)
  Care Coordination   08/15/2022 Name: Wayne Pollard MRN: [id number] DOB: 09-24-1966   Care Coordination Outreach Attempts:  A second unsuccessful outreach was attempted today to offer the patient with information about available care coordination services as a benefit of their health plan.     Follow Up Plan:  Additional outreach attempts will be made to offer the patient care coordination information and services.   Encounter Outcome:  No Answer  Care Coordination Interventions Activated:  No   Care Coordination Interventions:  No, not indicated    Eduard Clos MSW, LCSW Licensed Clinical Social Worker      [phone]

## 2022-08-17 DIAGNOSIS — Z20822 Contact with and (suspected) exposure to covid-19: Secondary | ICD-10-CM | POA: Diagnosis not present

## 2022-08-17 DIAGNOSIS — I10 Essential (primary) hypertension: Secondary | ICD-10-CM | POA: Diagnosis not present

## 2022-08-17 DIAGNOSIS — J069 Acute upper respiratory infection, unspecified: Secondary | ICD-10-CM | POA: Diagnosis not present

## 2022-08-17 DIAGNOSIS — F3341 Major depressive disorder, recurrent, in partial remission: Secondary | ICD-10-CM | POA: Diagnosis not present

## 2022-08-18 ENCOUNTER — Telehealth: Payer: Self-pay

## 2022-08-18 NOTE — Patient Outreach (Signed)
  Care Coordination   08/18/2022 Name: Wayne Pollard MRN: [id number] DOB: 1966/01/04   Care Coordination Outreach Attempts:  A third unsuccessful outreach was attempted today to offer the patient with information about available care coordination services as a benefit of their health plan.   Follow Up Plan:  No further outreach attempts will be made at this time. We have been unable to contact the patient to offer or enroll patient in care coordination services  Encounter Outcome:  No Answer  Care Coordination Interventions Activated:  No   Care Coordination Interventions:  No, not indicated    Tomasa Rand, RN, BSN, CEN [place] Coordinator [phone]

## 2022-08-23 DIAGNOSIS — H6692 Otitis media, unspecified, left ear: Secondary | ICD-10-CM | POA: Diagnosis not present

## 2022-08-23 DIAGNOSIS — F3341 Major depressive disorder, recurrent, in partial remission: Secondary | ICD-10-CM | POA: Diagnosis not present

## 2022-08-23 DIAGNOSIS — I10 Essential (primary) hypertension: Secondary | ICD-10-CM | POA: Diagnosis not present

## 2022-08-23 DIAGNOSIS — L409 Psoriasis, unspecified: Secondary | ICD-10-CM | POA: Diagnosis not present

## 2022-08-31 DIAGNOSIS — H6692 Otitis media, unspecified, left ear: Secondary | ICD-10-CM | POA: Diagnosis not present

## 2022-08-31 DIAGNOSIS — I10 Essential (primary) hypertension: Secondary | ICD-10-CM | POA: Diagnosis not present

## 2022-08-31 DIAGNOSIS — F3341 Major depressive disorder, recurrent, in partial remission: Secondary | ICD-10-CM | POA: Diagnosis not present

## 2022-08-31 DIAGNOSIS — L409 Psoriasis, unspecified: Secondary | ICD-10-CM | POA: Diagnosis not present

## 2022-09-14 DIAGNOSIS — I10 Essential (primary) hypertension: Secondary | ICD-10-CM | POA: Diagnosis not present

## 2022-09-14 DIAGNOSIS — H6692 Otitis media, unspecified, left ear: Secondary | ICD-10-CM | POA: Diagnosis not present

## 2022-09-14 DIAGNOSIS — F3341 Major depressive disorder, recurrent, in partial remission: Secondary | ICD-10-CM | POA: Diagnosis not present

## 2022-09-14 DIAGNOSIS — L409 Psoriasis, unspecified: Secondary | ICD-10-CM | POA: Diagnosis not present

## 2022-09-19 DIAGNOSIS — I10 Essential (primary) hypertension: Secondary | ICD-10-CM | POA: Diagnosis not present

## 2022-09-20 DIAGNOSIS — F3341 Major depressive disorder, recurrent, in partial remission: Secondary | ICD-10-CM | POA: Diagnosis not present

## 2022-09-20 DIAGNOSIS — I480 Paroxysmal atrial fibrillation: Secondary | ICD-10-CM | POA: Diagnosis not present

## 2022-09-20 DIAGNOSIS — I10 Essential (primary) hypertension: Secondary | ICD-10-CM | POA: Diagnosis not present

## 2022-09-20 DIAGNOSIS — L409 Psoriasis, unspecified: Secondary | ICD-10-CM | POA: Diagnosis not present

## 2022-09-28 DIAGNOSIS — I10 Essential (primary) hypertension: Secondary | ICD-10-CM | POA: Diagnosis not present

## 2022-10-03 DIAGNOSIS — L409 Psoriasis, unspecified: Secondary | ICD-10-CM | POA: Diagnosis not present

## 2022-10-03 DIAGNOSIS — J111 Influenza due to unidentified influenza virus with other respiratory manifestations: Secondary | ICD-10-CM | POA: Diagnosis not present

## 2022-10-03 DIAGNOSIS — I10 Essential (primary) hypertension: Secondary | ICD-10-CM | POA: Diagnosis not present

## 2022-10-03 DIAGNOSIS — F3341 Major depressive disorder, recurrent, in partial remission: Secondary | ICD-10-CM | POA: Diagnosis not present

## 2022-10-12 DIAGNOSIS — F419 Anxiety disorder, unspecified: Secondary | ICD-10-CM | POA: Diagnosis not present

## 2022-10-12 DIAGNOSIS — F3341 Major depressive disorder, recurrent, in partial remission: Secondary | ICD-10-CM | POA: Diagnosis not present

## 2022-10-12 DIAGNOSIS — M25511 Pain in right shoulder: Secondary | ICD-10-CM | POA: Diagnosis not present

## 2022-10-12 DIAGNOSIS — I10 Essential (primary) hypertension: Secondary | ICD-10-CM | POA: Diagnosis not present

## 2022-10-18 DIAGNOSIS — F419 Anxiety disorder, unspecified: Secondary | ICD-10-CM | POA: Diagnosis not present

## 2022-10-18 DIAGNOSIS — I10 Essential (primary) hypertension: Secondary | ICD-10-CM | POA: Diagnosis not present

## 2022-10-18 DIAGNOSIS — F3341 Major depressive disorder, recurrent, in partial remission: Secondary | ICD-10-CM | POA: Diagnosis not present

## 2022-10-18 DIAGNOSIS — R7989 Other specified abnormal findings of blood chemistry: Secondary | ICD-10-CM | POA: Diagnosis not present

## 2022-10-20 DIAGNOSIS — F3341 Major depressive disorder, recurrent, in partial remission: Secondary | ICD-10-CM | POA: Diagnosis not present

## 2022-10-20 DIAGNOSIS — I10 Essential (primary) hypertension: Secondary | ICD-10-CM | POA: Diagnosis not present

## 2022-10-20 DIAGNOSIS — R7989 Other specified abnormal findings of blood chemistry: Secondary | ICD-10-CM | POA: Diagnosis not present

## 2022-10-20 DIAGNOSIS — K529 Noninfective gastroenteritis and colitis, unspecified: Secondary | ICD-10-CM | POA: Diagnosis not present

## 2022-10-26 DIAGNOSIS — I10 Essential (primary) hypertension: Secondary | ICD-10-CM | POA: Diagnosis not present

## 2022-11-06 DIAGNOSIS — G8929 Other chronic pain: Secondary | ICD-10-CM | POA: Diagnosis not present

## 2022-11-06 DIAGNOSIS — M545 Low back pain, unspecified: Secondary | ICD-10-CM | POA: Diagnosis not present

## 2022-11-06 DIAGNOSIS — R7989 Other specified abnormal findings of blood chemistry: Secondary | ICD-10-CM | POA: Diagnosis not present

## 2022-11-06 DIAGNOSIS — I10 Essential (primary) hypertension: Secondary | ICD-10-CM | POA: Diagnosis not present

## 2022-11-10 DIAGNOSIS — I10 Essential (primary) hypertension: Secondary | ICD-10-CM | POA: Diagnosis not present

## 2022-11-24 DIAGNOSIS — I10 Essential (primary) hypertension: Secondary | ICD-10-CM | POA: Diagnosis not present

## 2022-12-08 DIAGNOSIS — I10 Essential (primary) hypertension: Secondary | ICD-10-CM | POA: Diagnosis not present

## 2022-12-08 DIAGNOSIS — L03116 Cellulitis of left lower limb: Secondary | ICD-10-CM | POA: Diagnosis not present

## 2022-12-08 DIAGNOSIS — G8929 Other chronic pain: Secondary | ICD-10-CM | POA: Diagnosis not present

## 2022-12-08 DIAGNOSIS — M545 Low back pain, unspecified: Secondary | ICD-10-CM | POA: Diagnosis not present

## 2022-12-08 DIAGNOSIS — R7989 Other specified abnormal findings of blood chemistry: Secondary | ICD-10-CM | POA: Diagnosis not present

## 2022-12-22 DIAGNOSIS — M545 Low back pain, unspecified: Secondary | ICD-10-CM | POA: Diagnosis not present

## 2022-12-22 DIAGNOSIS — I10 Essential (primary) hypertension: Secondary | ICD-10-CM | POA: Diagnosis not present

## 2022-12-22 DIAGNOSIS — R7989 Other specified abnormal findings of blood chemistry: Secondary | ICD-10-CM | POA: Diagnosis not present

## 2022-12-22 DIAGNOSIS — G8929 Other chronic pain: Secondary | ICD-10-CM | POA: Diagnosis not present

## 2023-01-05 DIAGNOSIS — I10 Essential (primary) hypertension: Secondary | ICD-10-CM | POA: Diagnosis not present

## 2023-01-08 DIAGNOSIS — I10 Essential (primary) hypertension: Secondary | ICD-10-CM | POA: Diagnosis not present

## 2023-01-08 DIAGNOSIS — G8929 Other chronic pain: Secondary | ICD-10-CM | POA: Diagnosis not present

## 2023-01-08 DIAGNOSIS — M545 Low back pain, unspecified: Secondary | ICD-10-CM | POA: Diagnosis not present

## 2023-01-08 DIAGNOSIS — R7989 Other specified abnormal findings of blood chemistry: Secondary | ICD-10-CM | POA: Diagnosis not present

## 2023-01-16 DIAGNOSIS — R7989 Other specified abnormal findings of blood chemistry: Secondary | ICD-10-CM | POA: Diagnosis not present

## 2023-01-16 DIAGNOSIS — G8929 Other chronic pain: Secondary | ICD-10-CM | POA: Diagnosis not present

## 2023-01-16 DIAGNOSIS — I10 Essential (primary) hypertension: Secondary | ICD-10-CM | POA: Diagnosis not present

## 2023-01-16 DIAGNOSIS — M545 Low back pain, unspecified: Secondary | ICD-10-CM | POA: Diagnosis not present

## 2023-01-30 DIAGNOSIS — N400 Enlarged prostate without lower urinary tract symptoms: Secondary | ICD-10-CM | POA: Diagnosis not present

## 2023-01-30 DIAGNOSIS — M545 Low back pain, unspecified: Secondary | ICD-10-CM | POA: Diagnosis not present

## 2023-01-30 DIAGNOSIS — G8929 Other chronic pain: Secondary | ICD-10-CM | POA: Diagnosis not present

## 2023-01-30 DIAGNOSIS — G4700 Insomnia, unspecified: Secondary | ICD-10-CM | POA: Diagnosis not present

## 2023-02-06 DIAGNOSIS — G4700 Insomnia, unspecified: Secondary | ICD-10-CM | POA: Diagnosis not present

## 2023-02-06 DIAGNOSIS — M545 Low back pain, unspecified: Secondary | ICD-10-CM | POA: Diagnosis not present

## 2023-02-06 DIAGNOSIS — N400 Enlarged prostate without lower urinary tract symptoms: Secondary | ICD-10-CM | POA: Diagnosis not present

## 2023-02-06 DIAGNOSIS — G8929 Other chronic pain: Secondary | ICD-10-CM | POA: Diagnosis not present

## 2023-02-26 DIAGNOSIS — N400 Enlarged prostate without lower urinary tract symptoms: Secondary | ICD-10-CM | POA: Diagnosis not present

## 2023-02-26 DIAGNOSIS — M545 Low back pain, unspecified: Secondary | ICD-10-CM | POA: Diagnosis not present

## 2023-02-26 DIAGNOSIS — G4700 Insomnia, unspecified: Secondary | ICD-10-CM | POA: Diagnosis not present

## 2023-02-26 DIAGNOSIS — G8929 Other chronic pain: Secondary | ICD-10-CM | POA: Diagnosis not present

## 2023-03-26 DIAGNOSIS — G8929 Other chronic pain: Secondary | ICD-10-CM | POA: Diagnosis not present

## 2023-03-26 DIAGNOSIS — G4700 Insomnia, unspecified: Secondary | ICD-10-CM | POA: Diagnosis not present

## 2023-03-26 DIAGNOSIS — M25551 Pain in right hip: Secondary | ICD-10-CM | POA: Diagnosis not present

## 2023-03-26 DIAGNOSIS — N400 Enlarged prostate without lower urinary tract symptoms: Secondary | ICD-10-CM | POA: Diagnosis not present

## 2023-03-26 DIAGNOSIS — M545 Low back pain, unspecified: Secondary | ICD-10-CM | POA: Diagnosis not present

## 2023-04-23 DIAGNOSIS — G4700 Insomnia, unspecified: Secondary | ICD-10-CM | POA: Diagnosis not present

## 2023-04-23 DIAGNOSIS — N400 Enlarged prostate without lower urinary tract symptoms: Secondary | ICD-10-CM | POA: Diagnosis not present

## 2023-04-23 DIAGNOSIS — I10 Essential (primary) hypertension: Secondary | ICD-10-CM | POA: Diagnosis not present

## 2023-04-23 DIAGNOSIS — M25551 Pain in right hip: Secondary | ICD-10-CM | POA: Diagnosis not present

## 2023-04-23 DIAGNOSIS — M545 Low back pain, unspecified: Secondary | ICD-10-CM | POA: Diagnosis not present

## 2023-04-23 DIAGNOSIS — G8929 Other chronic pain: Secondary | ICD-10-CM | POA: Diagnosis not present

## 2023-05-09 DIAGNOSIS — M25551 Pain in right hip: Secondary | ICD-10-CM | POA: Diagnosis not present

## 2023-05-09 DIAGNOSIS — G8929 Other chronic pain: Secondary | ICD-10-CM | POA: Diagnosis not present

## 2023-05-09 DIAGNOSIS — G4700 Insomnia, unspecified: Secondary | ICD-10-CM | POA: Diagnosis not present

## 2023-05-09 DIAGNOSIS — M545 Low back pain, unspecified: Secondary | ICD-10-CM | POA: Diagnosis not present

## 2023-05-09 DIAGNOSIS — N400 Enlarged prostate without lower urinary tract symptoms: Secondary | ICD-10-CM | POA: Diagnosis not present

## 2023-05-21 DIAGNOSIS — M545 Low back pain, unspecified: Secondary | ICD-10-CM | POA: Diagnosis not present

## 2023-05-21 DIAGNOSIS — G8929 Other chronic pain: Secondary | ICD-10-CM | POA: Diagnosis not present

## 2023-05-21 DIAGNOSIS — N400 Enlarged prostate without lower urinary tract symptoms: Secondary | ICD-10-CM | POA: Diagnosis not present

## 2023-05-21 DIAGNOSIS — I10 Essential (primary) hypertension: Secondary | ICD-10-CM | POA: Diagnosis not present

## 2023-05-21 DIAGNOSIS — G4700 Insomnia, unspecified: Secondary | ICD-10-CM | POA: Diagnosis not present

## 2023-06-05 DIAGNOSIS — M5116 Intervertebral disc disorders with radiculopathy, lumbar region: Secondary | ICD-10-CM | POA: Diagnosis not present

## 2023-06-05 DIAGNOSIS — M5136 Other intervertebral disc degeneration, lumbar region: Secondary | ICD-10-CM | POA: Diagnosis not present

## 2023-06-18 ENCOUNTER — Emergency Department (HOSPITAL_COMMUNITY)
Admission: EM | Admit: 2023-06-18 | Discharge: 2023-06-19 | Disposition: A | Discharge status: Home / Self Care | Payer: BC Managed Care – PPO | Attending: Emergency Medicine | Admitting: Emergency Medicine

## 2023-06-18 ENCOUNTER — Other Ambulatory Visit: Payer: Self-pay

## 2023-06-18 ENCOUNTER — Encounter (HOSPITAL_COMMUNITY): Payer: Self-pay

## 2023-06-18 DIAGNOSIS — M5442 Lumbago with sciatica, left side: Secondary | ICD-10-CM | POA: Diagnosis not present

## 2023-06-18 DIAGNOSIS — M5137 Other intervertebral disc degeneration, lumbosacral region: Secondary | ICD-10-CM | POA: Diagnosis not present

## 2023-06-18 DIAGNOSIS — G8929 Other chronic pain: Secondary | ICD-10-CM | POA: Diagnosis not present

## 2023-06-18 DIAGNOSIS — M5441 Lumbago with sciatica, right side: Secondary | ICD-10-CM | POA: Insufficient documentation

## 2023-06-18 DIAGNOSIS — M545 Low back pain, unspecified: Secondary | ICD-10-CM | POA: Diagnosis not present

## 2023-06-18 DIAGNOSIS — M48061 Spinal stenosis, lumbar region without neurogenic claudication: Secondary | ICD-10-CM | POA: Diagnosis not present

## 2023-06-18 DIAGNOSIS — I7143 Infrarenal abdominal aortic aneurysm, without rupture: Secondary | ICD-10-CM | POA: Diagnosis not present

## 2023-06-18 NOTE — ED Triage Notes (Signed)
Pt arrives with c/o lower back pain that had increased in the last 2 days. Pt has hx lumbar back issue and needs surgery. Pt endorse urinary and stool incontinence today. Pt c/o pain radiating into his right groin area.

## 2023-06-19 ENCOUNTER — Emergency Department (HOSPITAL_COMMUNITY): Payer: BC Managed Care – PPO

## 2023-06-19 DIAGNOSIS — M5137 Other intervertebral disc degeneration, lumbosacral region: Secondary | ICD-10-CM | POA: Diagnosis not present

## 2023-06-19 DIAGNOSIS — M48061 Spinal stenosis, lumbar region without neurogenic claudication: Secondary | ICD-10-CM | POA: Diagnosis not present

## 2023-06-19 DIAGNOSIS — M545 Low back pain, unspecified: Secondary | ICD-10-CM | POA: Diagnosis not present

## 2023-06-19 DIAGNOSIS — I7143 Infrarenal abdominal aortic aneurysm, without rupture: Secondary | ICD-10-CM | POA: Diagnosis not present

## 2023-06-19 LAB — CBC WITH DIFFERENTIAL/PLATELET
Abs Immature Granulocytes: 0.03 10*3/uL (ref 0.00–0.07)
Basophils Absolute: 0 10*3/uL (ref 0.0–0.1)
Basophils Relative: 1 %
Eosinophils Absolute: 0.2 10*3/uL (ref 0.0–0.5)
Eosinophils Relative: 2 %
HCT: 38.8 % — ABNORMAL LOW (ref 39.0–52.0)
Hemoglobin: 13.3 g/dL (ref 13.0–17.0)
Immature Granulocytes: 0 %
Lymphocytes Relative: 14 %
Lymphs Abs: 1.1 10*3/uL (ref 0.7–4.0)
MCH: 32.5 pg (ref 26.0–34.0)
MCHC: 34.3 g/dL (ref 30.0–36.0)
MCV: 94.9 fL (ref 80.0–100.0)
Monocytes Absolute: 0.8 10*3/uL (ref 0.1–1.0)
Monocytes Relative: 10 %
Neutro Abs: 6 10*3/uL (ref 1.7–7.7)
Neutrophils Relative %: 73 %
Platelets: 178 10*3/uL (ref 150–400)
RBC: 4.09 MIL/uL — ABNORMAL LOW (ref 4.22–5.81)
RDW: 12.3 % (ref 11.5–15.5)
WBC: 8.3 10*3/uL (ref 4.0–10.5)
nRBC: 0 % (ref 0.0–0.2)

## 2023-06-19 LAB — URINALYSIS, W/ REFLEX TO CULTURE (INFECTION SUSPECTED)
Bacteria, UA: NONE SEEN
Bilirubin Urine: NEGATIVE
Glucose, UA: NEGATIVE mg/dL
Ketones, ur: NEGATIVE mg/dL
Leukocytes,Ua: NEGATIVE
Nitrite: NEGATIVE
Protein, ur: NEGATIVE mg/dL
Specific Gravity, Urine: 1.014 (ref 1.005–1.030)
pH: 5 (ref 5.0–8.0)

## 2023-06-19 LAB — BASIC METABOLIC PANEL
Anion gap: 8 (ref 5–15)
BUN: 23 mg/dL — ABNORMAL HIGH (ref 6–20)
CO2: 27 mmol/L (ref 22–32)
Calcium: 8.8 mg/dL — ABNORMAL LOW (ref 8.9–10.3)
Chloride: 100 mmol/L (ref 98–111)
Creatinine, Ser: 1.22 mg/dL (ref 0.61–1.24)
GFR, Estimated: >60 mL/min (ref >60)
Glucose, Bld: 107 mg/dL — ABNORMAL HIGH (ref 70–99)
Potassium: 3.4 mmol/L — ABNORMAL LOW (ref 3.5–5.1)
Sodium: 135 mmol/L (ref 135–145)

## 2023-06-19 MED ORDER — ONDANSETRON HCL 4 MG/2ML IJ SOLN
4.0000 mg | Freq: Once | INTRAMUSCULAR | Status: AC
  Administered 2023-06-19: 4 mg via INTRAVENOUS
  Filled 2023-06-19: qty 2

## 2023-06-19 MED ORDER — KETOROLAC TROMETHAMINE 30 MG/ML IJ SOLN
30.0000 mg | Freq: Once | INTRAMUSCULAR | Status: AC
  Administered 2023-06-19: 30 mg via INTRAVENOUS
  Filled 2023-06-19: qty 1

## 2023-06-19 MED ORDER — SODIUM CHLORIDE 0.9 % IV BOLUS
500.0000 mL | Freq: Once | INTRAVENOUS | Status: AC
  Administered 2023-06-19: 500 mL via INTRAVENOUS

## 2023-06-19 MED ORDER — DEXAMETHASONE SODIUM PHOSPHATE 10 MG/ML IJ SOLN
10.0000 mg | Freq: Once | INTRAMUSCULAR | Status: AC
  Administered 2023-06-19: 10 mg via INTRAVENOUS
  Filled 2023-06-19: qty 1

## 2023-06-19 MED ORDER — HYDROMORPHONE HCL 1 MG/ML IJ SOLN
1.0000 mg | Freq: Once | INTRAMUSCULAR | Status: AC
  Administered 2023-06-19: 1 mg via INTRAVENOUS
  Filled 2023-06-19: qty 1

## 2023-06-19 MED ORDER — PREDNISONE 20 MG PO TABS: ORAL_TABLET | ORAL | 0 refills | Status: DC

## 2023-06-19 NOTE — ED Provider Notes (Signed)
[place] [facility] EMERGENCY DEPARTMENT AT [facility] Provider Note   CSN: [id number] Arrival date & time: 06/18/23  2323     History  Chief Complaint  Patient presents with   Back Pain    Wayne Pollard is a 57 y.o. male.  Patient presents to the emergency department for evaluation of lower back pain.  Patient has a history of chronic back pain.  He is under the care of Dr. Penne Lash who previously operated on his neck.  He has been recommended to have lumbar surgery but has not had surgery on his lower back previously.  Patient reports worsening pain over the last few days.  He reports that he always has some numbness of his feet but is decreased sensation has worsened and now he has been urinating without knowing he is going to go.       Home Medications Prior to Admission medications   Medication Sig Start Date End Date Taking? Authorizing Provider  predniSONE (DELTASONE) 20 MG tablet 3 tabs po daily x 3 days, then 2 tabs x 3 days, then 1.5 tabs x 3 days, then 1 tab x 3 days, then 0.5 tabs x 3 days 06/19/23  Yes Khamille Beynon, Canary Brim, MD  ALPRAZolam Prudy Feeler) 0.5 MG tablet Take 0.5 mg by mouth 2 (two) times daily as needed for anxiety.     [provider]  Aspirin-Acetaminophen-Caffeine (GOODY HEADACHE PO) Take 1 packet by mouth daily as needed (headaches).    [provider]  esomeprazole (NEXIUM) 40 MG capsule Take 40 mg by mouth daily at 12 noon.    [provider]  HYDROcodone-acetaminophen (NORCO) 10-325 MG tablet Take 1 tablet by mouth 3 (three) times daily as needed for severe pain.    [provider]  nitroGLYCERIN (NITROSTAT) 0.4 MG SL tablet Place 1 tablet (0.4 mg total) under the tongue every 5 (five) minutes as needed for chest pain. 06/16/15   Allred, Fayrene Fearing, MD  temazepam (RESTORIL) 30 MG capsule Take 30 mg by mouth at bedtime.    [provider]  Tetrahydrozoline HCl (VISINE OP) Apply 1 drop to eye daily.    [provider]  Ustekinumab (STELARA [place]) Inject 1 Dose into the skin every 3 (three) months.    [provider]      Allergies    Codeine    Review of Systems   Review of Systems  Physical Exam Updated Vital Signs BP 110/71   Pulse 60   Temp 98.2 F (36.8 C) (Oral)   Resp 14   Wt 93 kg   SpO2 91%   BMI 27.80 kg/m  Physical Exam Vitals and nursing note reviewed.  Constitutional:      General: He is not in acute distress.    Appearance: He is well-developed.  HENT:     Head: Normocephalic and atraumatic.     Mouth/Throat:     Mouth: Mucous membranes are moist.  Eyes:     General: Vision grossly intact. Gaze aligned appropriately.     Extraocular Movements: Extraocular movements intact.     Conjunctiva/sclera: Conjunctivae normal.  Cardiovascular:     Rate and Rhythm: Normal rate and regular rhythm.     Pulses: Normal pulses.     Heart sounds: Normal heart sounds, S1 normal and S2 normal. No murmur heard.    No friction rub. No gallop.  Pulmonary:     Effort: Pulmonary effort is normal. No respiratory distress.     Breath sounds:  Normal breath sounds.  Abdominal:     Palpations: Abdomen is soft.     Tenderness: There is no abdominal tenderness. There is no guarding or rebound.     Hernia: No hernia is present.  Musculoskeletal:        General: No swelling.     Cervical back: Full passive range of motion without pain, normal range of motion and neck supple. No pain with movement, spinous process tenderness or muscular tenderness. Normal range of motion.     Right lower leg: No edema.     Left lower leg: No edema.  Skin:    General: Skin is warm and dry.     Capillary Refill: Capillary refill takes less than 2 seconds.     Findings: No ecchymosis, erythema, lesion or wound.  Neurological:     Mental Status: He is alert and oriented to person, place, and time.     GCS: GCS eye subscore is 4. GCS verbal subscore is 5. GCS motor subscore is 6.     Cranial  Nerves: Cranial nerves 2-12 are intact.     Sensory: Sensation is intact.     Motor: Motor function is intact. No weakness or abnormal muscle tone.     Coordination: Coordination is intact.     Deep Tendon Reflexes:     Reflex Scores:      Patellar reflexes are 2+ on the right side and 2+ on the left side. Psychiatric:        Mood and Affect: Mood normal.        Speech: Speech normal.        Behavior: Behavior normal.     ED Results / Procedures / Treatments   Labs (all labs ordered are listed, but only abnormal results are displayed) Labs Reviewed  CBC WITH DIFFERENTIAL/PLATELET - Abnormal; Notable for the following components:      Result Value   RBC 4.09 (*)    HCT 38.8 (*)    All other components within normal limits  BASIC METABOLIC PANEL - Abnormal; Notable for the following components:   Potassium 3.4 (*)    Glucose, Bld 107 (*)    BUN 23 (*)    Calcium 8.8 (*)    All other components within normal limits  URINALYSIS, W/ REFLEX TO CULTURE (INFECTION SUSPECTED) - Abnormal; Notable for the following components:   Hgb urine dipstick LARGE (*)    All other components within normal limits    EKG None  Radiology MR LUMBAR SPINE WO CONTRAST  Result Date: 06/19/2023 CLINICAL DATA:  Initial evaluation for acute low back pain, cauda equina syndrome suspected. EXAM: MRI LUMBAR SPINE WITHOUT CONTRAST TECHNIQUE: Multiplanar, multisequence MR imaging of the lumbar spine was performed. No intravenous contrast was administered. COMPARISON:  Prior study from 04/21/2015. FINDINGS: Segmentation: Standard. Lowest well-formed disc space labeled the L5-S1 level. Alignment: Trace levoscoliosis. Alignment otherwise normal preservation of the normal lumbar lordosis. No significant listhesis. Vertebrae: Vertebral body height maintained without acute or chronic fracture. Bone marrow signal intensity within normal limits. No discrete or worrisome osseous lesions. No abnormal marrow edema. Conus  medullaris and cauda equina: Conus extends to the T12 level. Conus and cauda equina appear normal. Paraspinal and other soft tissues: Paraspinous soft tissues within normal limits. Few subcentimeter T2 hyperintense right renal cyst noted, benign in appearance, no follow-up imaging recommended. Infrarenal aortic aneurysm measuring up to 4.4 cm noted (series 8, image 27). Disc levels: L1-2: Normal interspace. Mild bilateral facet hypertrophy. No  canal or foraminal stenosis. L2-3: Disc desiccation with mild diffuse disc bulge. Superimposed small right foraminal to extraforaminal disc protrusion with annular fissure closely approximates the exiting right L2 nerve root (series 8, image 19). Mild bilateral facet hypertrophy. Mild narrowing of the right lateral recess. Central canal remains patent. No significant foraminal stenosis. L3-4: Disc desiccation with diffuse disc bulge. Mild bilateral facet hypertrophy. No significant spinal stenosis. Mild left greater than right L3 foraminal stenosis. L4-5: Disc desiccation with mild diffuse disc bulge. Superimposed small left subarticular disc protrusion with annular fissure closely approximates and/or contacts the descending left L5 nerve root (series 8, image 30). Mild bilateral facet hypertrophy. Resultant mild left lateral recess stenosis. Central canal remains patent. Mild left L4 foraminal narrowing. Right neural foramina remains adequately patent. Small annular fissure noted at the level of the right neural foramen as well. L5-S1: Disc desiccation with mild disc bulge. Central and right foraminal annular fissures. No spinal stenosis. Mild right L5 foraminal narrowing. Left neural foramen remains patent. IMPRESSION: 1. No acute abnormality within the lumbar spine. No evidence for cord compression. 2. Small right foraminal to extraforaminal disc protrusion at L2-3, potentially affecting the exiting right L2 nerve root. 3. Small left subarticular disc protrusion at L4-5,  closely approximating and potentially irritating the descending left L5 nerve root. 4. Infrarenal aortic aneurysm measuring up to 4.4 cm in diameter. Recommend follow-up every 12 months and vascular consultation. This recommendation follows ACR consensus guidelines: White Paper of the ACR Incidental Findings Committee II on Vascular Findings. J Am Coll Radiol 2013; 10:789-794. Electronically Signed   By: Rise Mu M.D.   On: 06/19/2023 03:41    Procedures Procedures    Medications Ordered in ED Medications  dexamethasone (DECADRON) injection 10 mg (has no administration in time range)  ketorolac (TORADOL) 30 MG/ML injection 30 mg (has no administration in time range)  sodium chloride 0.9 % bolus 500 mL (0 mLs Intravenous Stopped 06/19/23 0159)  HYDROmorphone (DILAUDID) injection 1 mg (1 mg Intravenous Given 06/19/23 0055)  ondansetron (ZOFRAN) injection 4 mg (4 mg Intravenous Given 06/19/23 0055)    ED Course/ Medical Decision Making/ A&P                                 Medical Decision Making Amount and/or Complexity of Data Reviewed Labs: ordered. Radiology: ordered.  Risk Prescription drug management.   Differential Diagnosis considered includes, but not limited to: Muscle strain; acute disc herniation; lumbar fracture; cauda equina syndrome; infection; malignancy  Patient with chronic low back pain presents to the emergency department with increasing back pain, subjective decrease sensation in both of his legs.  Patient reports that he has been incontinent of urine and stool.  Examination does not support concern for cauda equina syndrome.  Patient with retained reflexes, sensation.  No saddle anesthesia.  Because of his history, MR lumbar spine was performed to evaluate for cauda equina.  Patient with minor chronic abnormalities, no acute findings that would explain his symptoms.  Patient will not require emergent neurosurgical intervention, discharged with steroids,  follow-up with his spinal surgeon.        Final Clinical Impression(s) / ED Diagnoses Final diagnoses:  Chronic midline low back pain with bilateral sciatica    Rx / DC Orders ED Discharge Orders          Ordered    predniSONE (DELTASONE) 20 MG tablet  06/19/23 9147              Gilda Crease, MD 06/19/23 [phone]

## 2023-06-19 NOTE — ED Notes (Signed)
Patient transported to MRI 

## 2023-06-20 DIAGNOSIS — G4700 Insomnia, unspecified: Secondary | ICD-10-CM | POA: Diagnosis not present

## 2023-06-20 DIAGNOSIS — G8929 Other chronic pain: Secondary | ICD-10-CM | POA: Diagnosis not present

## 2023-06-20 DIAGNOSIS — M545 Low back pain, unspecified: Secondary | ICD-10-CM | POA: Diagnosis not present

## 2023-06-20 DIAGNOSIS — N400 Enlarged prostate without lower urinary tract symptoms: Secondary | ICD-10-CM | POA: Diagnosis not present

## 2023-06-28 DIAGNOSIS — M4316 Spondylolisthesis, lumbar region: Secondary | ICD-10-CM | POA: Diagnosis not present

## 2023-06-28 DIAGNOSIS — M48062 Spinal stenosis, lumbar region with neurogenic claudication: Secondary | ICD-10-CM | POA: Diagnosis not present

## 2023-07-02 DIAGNOSIS — M545 Low back pain, unspecified: Secondary | ICD-10-CM | POA: Diagnosis not present

## 2023-07-02 DIAGNOSIS — I10 Essential (primary) hypertension: Secondary | ICD-10-CM | POA: Diagnosis not present

## 2023-07-02 DIAGNOSIS — G8929 Other chronic pain: Secondary | ICD-10-CM | POA: Diagnosis not present

## 2023-07-02 DIAGNOSIS — I7143 Infrarenal abdominal aortic aneurysm, without rupture: Secondary | ICD-10-CM | POA: Diagnosis not present

## 2023-07-04 DIAGNOSIS — I10 Essential (primary) hypertension: Secondary | ICD-10-CM | POA: Diagnosis not present

## 2023-07-04 DIAGNOSIS — G8929 Other chronic pain: Secondary | ICD-10-CM | POA: Diagnosis not present

## 2023-07-04 DIAGNOSIS — M545 Low back pain, unspecified: Secondary | ICD-10-CM | POA: Diagnosis not present

## 2023-07-04 DIAGNOSIS — R7989 Other specified abnormal findings of blood chemistry: Secondary | ICD-10-CM | POA: Diagnosis not present

## 2023-07-09 DIAGNOSIS — Z0000 Encounter for general adult medical examination without abnormal findings: Secondary | ICD-10-CM | POA: Diagnosis not present

## 2023-07-09 DIAGNOSIS — R0602 Shortness of breath: Secondary | ICD-10-CM | POA: Diagnosis not present

## 2023-07-09 DIAGNOSIS — Z13220 Encounter for screening for lipoid disorders: Secondary | ICD-10-CM | POA: Diagnosis not present

## 2023-07-09 DIAGNOSIS — G8929 Other chronic pain: Secondary | ICD-10-CM | POA: Diagnosis not present

## 2023-07-09 DIAGNOSIS — M129 Arthropathy, unspecified: Secondary | ICD-10-CM | POA: Diagnosis not present

## 2023-07-09 DIAGNOSIS — Z79899 Other long term (current) drug therapy: Secondary | ICD-10-CM | POA: Diagnosis not present

## 2023-07-09 DIAGNOSIS — Z131 Encounter for screening for diabetes mellitus: Secondary | ICD-10-CM | POA: Diagnosis not present

## 2023-07-09 DIAGNOSIS — R5383 Other fatigue: Secondary | ICD-10-CM | POA: Diagnosis not present

## 2023-07-09 DIAGNOSIS — E559 Vitamin D deficiency, unspecified: Secondary | ICD-10-CM | POA: Diagnosis not present

## 2023-07-09 DIAGNOSIS — M549 Dorsalgia, unspecified: Secondary | ICD-10-CM | POA: Diagnosis not present

## 2023-07-09 DIAGNOSIS — Z114 Encounter for screening for human immunodeficiency virus [HIV]: Secondary | ICD-10-CM | POA: Diagnosis not present

## 2023-07-09 DIAGNOSIS — Z6830 Body mass index (BMI) 30.0-30.9, adult: Secondary | ICD-10-CM | POA: Diagnosis not present

## 2023-07-09 DIAGNOSIS — Z125 Encounter for screening for malignant neoplasm of prostate: Secondary | ICD-10-CM | POA: Diagnosis not present

## 2023-07-11 DIAGNOSIS — Z79899 Other long term (current) drug therapy: Secondary | ICD-10-CM | POA: Diagnosis not present

## 2023-07-13 ENCOUNTER — Ambulatory Visit: Payer: BC Managed Care – PPO | Admitting: Vascular Surgery

## 2023-07-13 VITALS — BP 101/65 | HR 64 | Temp 98.4°F | Ht 72.0 in | Wt 227.4 lb

## 2023-07-13 DIAGNOSIS — I7143 Infrarenal abdominal aortic aneurysm, without rupture: Secondary | ICD-10-CM | POA: Diagnosis not present

## 2023-07-13 DIAGNOSIS — M545 Low back pain, unspecified: Secondary | ICD-10-CM | POA: Diagnosis not present

## 2023-07-13 DIAGNOSIS — G8929 Other chronic pain: Secondary | ICD-10-CM | POA: Diagnosis not present

## 2023-07-13 DIAGNOSIS — Z01810 Encounter for preprocedural cardiovascular examination: Secondary | ICD-10-CM | POA: Diagnosis not present

## 2023-07-13 MED ORDER — ASPIRIN 81 MG PO TBEC: 81.0000 mg | DELAYED_RELEASE_TABLET | Freq: Every day | ORAL | 12 refills | Status: DC

## 2023-07-13 MED ORDER — ATORVASTATIN CALCIUM 40 MG PO TABS: 40.0000 mg | ORAL_TABLET | Freq: Every day | ORAL | 11 refills | Status: DC

## 2023-07-16 ENCOUNTER — Encounter: Payer: BC Managed Care – PPO | Admitting: Vascular Surgery

## 2023-07-19 DIAGNOSIS — M4316 Spondylolisthesis, lumbar region: Secondary | ICD-10-CM | POA: Diagnosis not present

## 2023-07-19 DIAGNOSIS — M5431 Sciatica, right side: Secondary | ICD-10-CM | POA: Diagnosis not present

## 2023-07-19 DIAGNOSIS — M48062 Spinal stenosis, lumbar region with neurogenic claudication: Secondary | ICD-10-CM | POA: Diagnosis not present

## 2023-07-25 DIAGNOSIS — E6609 Other obesity due to excess calories: Secondary | ICD-10-CM | POA: Diagnosis not present

## 2023-07-25 DIAGNOSIS — F411 Generalized anxiety disorder: Secondary | ICD-10-CM | POA: Diagnosis not present

## 2023-07-25 DIAGNOSIS — M549 Dorsalgia, unspecified: Secondary | ICD-10-CM | POA: Diagnosis not present

## 2023-07-25 DIAGNOSIS — Z6830 Body mass index (BMI) 30.0-30.9, adult: Secondary | ICD-10-CM | POA: Diagnosis not present

## 2023-07-25 DIAGNOSIS — G4710 Hypersomnia, unspecified: Secondary | ICD-10-CM | POA: Diagnosis not present

## 2023-07-25 DIAGNOSIS — G8929 Other chronic pain: Secondary | ICD-10-CM | POA: Diagnosis not present

## 2023-07-27 DIAGNOSIS — M4316 Spondylolisthesis, lumbar region: Secondary | ICD-10-CM | POA: Diagnosis not present

## 2023-07-27 DIAGNOSIS — M48062 Spinal stenosis, lumbar region with neurogenic claudication: Secondary | ICD-10-CM | POA: Diagnosis not present

## 2023-07-27 DIAGNOSIS — Z79899 Other long term (current) drug therapy: Secondary | ICD-10-CM | POA: Diagnosis not present

## 2023-07-30 DIAGNOSIS — R0683 Snoring: Secondary | ICD-10-CM | POA: Diagnosis not present

## 2023-07-30 DIAGNOSIS — G4710 Hypersomnia, unspecified: Secondary | ICD-10-CM | POA: Diagnosis not present

## 2023-07-31 DIAGNOSIS — I480 Paroxysmal atrial fibrillation: Secondary | ICD-10-CM | POA: Diagnosis not present

## 2023-07-31 DIAGNOSIS — R001 Bradycardia, unspecified: Secondary | ICD-10-CM | POA: Diagnosis not present

## 2023-08-03 DIAGNOSIS — M48062 Spinal stenosis, lumbar region with neurogenic claudication: Secondary | ICD-10-CM | POA: Diagnosis not present

## 2023-08-03 DIAGNOSIS — M545 Low back pain, unspecified: Secondary | ICD-10-CM | POA: Diagnosis not present

## 2023-08-03 DIAGNOSIS — Z01812 Encounter for preprocedural laboratory examination: Secondary | ICD-10-CM | POA: Diagnosis not present

## 2023-08-03 DIAGNOSIS — Z79899 Other long term (current) drug therapy: Secondary | ICD-10-CM | POA: Diagnosis not present

## 2023-08-03 DIAGNOSIS — R7989 Other specified abnormal findings of blood chemistry: Secondary | ICD-10-CM | POA: Diagnosis not present

## 2023-08-03 DIAGNOSIS — I10 Essential (primary) hypertension: Secondary | ICD-10-CM | POA: Diagnosis not present

## 2023-08-03 DIAGNOSIS — M5431 Sciatica, right side: Secondary | ICD-10-CM | POA: Diagnosis not present

## 2023-08-03 DIAGNOSIS — M4316 Spondylolisthesis, lumbar region: Secondary | ICD-10-CM | POA: Diagnosis not present

## 2023-08-03 DIAGNOSIS — G8929 Other chronic pain: Secondary | ICD-10-CM | POA: Diagnosis not present

## 2023-08-06 DIAGNOSIS — Z981 Arthrodesis status: Secondary | ICD-10-CM | POA: Diagnosis not present

## 2023-08-06 DIAGNOSIS — M48062 Spinal stenosis, lumbar region with neurogenic claudication: Secondary | ICD-10-CM | POA: Diagnosis not present

## 2023-08-06 DIAGNOSIS — M4316 Spondylolisthesis, lumbar region: Secondary | ICD-10-CM | POA: Diagnosis not present

## 2023-08-07 DIAGNOSIS — Z981 Arthrodesis status: Secondary | ICD-10-CM | POA: Diagnosis not present

## 2023-08-08 DIAGNOSIS — M4316 Spondylolisthesis, lumbar region: Secondary | ICD-10-CM | POA: Diagnosis not present

## 2023-08-10 DIAGNOSIS — Z7982 Long term (current) use of aspirin: Secondary | ICD-10-CM | POA: Diagnosis not present

## 2023-08-10 DIAGNOSIS — I509 Heart failure, unspecified: Secondary | ICD-10-CM | POA: Diagnosis not present

## 2023-08-10 DIAGNOSIS — R7989 Other specified abnormal findings of blood chemistry: Secondary | ICD-10-CM | POA: Diagnosis not present

## 2023-08-10 DIAGNOSIS — I4891 Unspecified atrial fibrillation: Secondary | ICD-10-CM | POA: Diagnosis not present

## 2023-08-10 DIAGNOSIS — Z981 Arthrodesis status: Secondary | ICD-10-CM | POA: Diagnosis not present

## 2023-08-10 DIAGNOSIS — I714 Abdominal aortic aneurysm, without rupture, unspecified: Secondary | ICD-10-CM | POA: Diagnosis not present

## 2023-08-10 DIAGNOSIS — B1910 Unspecified viral hepatitis B without hepatic coma: Secondary | ICD-10-CM | POA: Diagnosis not present

## 2023-08-10 DIAGNOSIS — Z556 Problems related to health literacy: Secondary | ICD-10-CM | POA: Diagnosis not present

## 2023-08-10 DIAGNOSIS — M48062 Spinal stenosis, lumbar region with neurogenic claudication: Secondary | ICD-10-CM | POA: Diagnosis not present

## 2023-08-10 DIAGNOSIS — G8929 Other chronic pain: Secondary | ICD-10-CM | POA: Diagnosis not present

## 2023-08-10 DIAGNOSIS — M5431 Sciatica, right side: Secondary | ICD-10-CM | POA: Diagnosis not present

## 2023-08-10 DIAGNOSIS — F32A Depression, unspecified: Secondary | ICD-10-CM | POA: Diagnosis not present

## 2023-08-10 DIAGNOSIS — Z4789 Encounter for other orthopedic aftercare: Secondary | ICD-10-CM | POA: Diagnosis not present

## 2023-08-10 DIAGNOSIS — I10 Essential (primary) hypertension: Secondary | ICD-10-CM | POA: Diagnosis not present

## 2023-08-10 DIAGNOSIS — M4316 Spondylolisthesis, lumbar region: Secondary | ICD-10-CM | POA: Diagnosis not present

## 2023-08-10 DIAGNOSIS — I110 Hypertensive heart disease with heart failure: Secondary | ICD-10-CM | POA: Diagnosis not present

## 2023-08-10 DIAGNOSIS — Z79891 Long term (current) use of opiate analgesic: Secondary | ICD-10-CM | POA: Diagnosis not present

## 2023-08-10 DIAGNOSIS — M545 Low back pain, unspecified: Secondary | ICD-10-CM | POA: Diagnosis not present

## 2023-08-10 DIAGNOSIS — F419 Anxiety disorder, unspecified: Secondary | ICD-10-CM | POA: Diagnosis not present

## 2023-08-11 DIAGNOSIS — I714 Abdominal aortic aneurysm, without rupture, unspecified: Secondary | ICD-10-CM | POA: Diagnosis not present

## 2023-08-11 DIAGNOSIS — B1910 Unspecified viral hepatitis B without hepatic coma: Secondary | ICD-10-CM | POA: Diagnosis not present

## 2023-08-11 DIAGNOSIS — Z79891 Long term (current) use of opiate analgesic: Secondary | ICD-10-CM | POA: Diagnosis not present

## 2023-08-11 DIAGNOSIS — Z7982 Long term (current) use of aspirin: Secondary | ICD-10-CM | POA: Diagnosis not present

## 2023-08-11 DIAGNOSIS — Z556 Problems related to health literacy: Secondary | ICD-10-CM | POA: Diagnosis not present

## 2023-08-11 DIAGNOSIS — F419 Anxiety disorder, unspecified: Secondary | ICD-10-CM | POA: Diagnosis not present

## 2023-08-11 DIAGNOSIS — Z981 Arthrodesis status: Secondary | ICD-10-CM | POA: Diagnosis not present

## 2023-08-11 DIAGNOSIS — M5431 Sciatica, right side: Secondary | ICD-10-CM | POA: Diagnosis not present

## 2023-08-11 DIAGNOSIS — M4316 Spondylolisthesis, lumbar region: Secondary | ICD-10-CM | POA: Diagnosis not present

## 2023-08-11 DIAGNOSIS — I4891 Unspecified atrial fibrillation: Secondary | ICD-10-CM | POA: Diagnosis not present

## 2023-08-11 DIAGNOSIS — F32A Depression, unspecified: Secondary | ICD-10-CM | POA: Diagnosis not present

## 2023-08-11 DIAGNOSIS — I110 Hypertensive heart disease with heart failure: Secondary | ICD-10-CM | POA: Diagnosis not present

## 2023-08-11 DIAGNOSIS — Z4789 Encounter for other orthopedic aftercare: Secondary | ICD-10-CM | POA: Diagnosis not present

## 2023-08-11 DIAGNOSIS — M48062 Spinal stenosis, lumbar region with neurogenic claudication: Secondary | ICD-10-CM | POA: Diagnosis not present

## 2023-08-11 DIAGNOSIS — I509 Heart failure, unspecified: Secondary | ICD-10-CM | POA: Diagnosis not present

## 2023-08-13 DIAGNOSIS — Z79891 Long term (current) use of opiate analgesic: Secondary | ICD-10-CM | POA: Diagnosis not present

## 2023-08-13 DIAGNOSIS — Z7982 Long term (current) use of aspirin: Secondary | ICD-10-CM | POA: Diagnosis not present

## 2023-08-13 DIAGNOSIS — Z4789 Encounter for other orthopedic aftercare: Secondary | ICD-10-CM | POA: Diagnosis not present

## 2023-08-13 DIAGNOSIS — M48062 Spinal stenosis, lumbar region with neurogenic claudication: Secondary | ICD-10-CM | POA: Diagnosis not present

## 2023-08-13 DIAGNOSIS — I110 Hypertensive heart disease with heart failure: Secondary | ICD-10-CM | POA: Diagnosis not present

## 2023-08-13 DIAGNOSIS — M4316 Spondylolisthesis, lumbar region: Secondary | ICD-10-CM | POA: Diagnosis not present

## 2023-08-13 DIAGNOSIS — I509 Heart failure, unspecified: Secondary | ICD-10-CM | POA: Diagnosis not present

## 2023-08-13 DIAGNOSIS — M5431 Sciatica, right side: Secondary | ICD-10-CM | POA: Diagnosis not present

## 2023-08-13 DIAGNOSIS — I4891 Unspecified atrial fibrillation: Secondary | ICD-10-CM | POA: Diagnosis not present

## 2023-08-13 DIAGNOSIS — F419 Anxiety disorder, unspecified: Secondary | ICD-10-CM | POA: Diagnosis not present

## 2023-08-13 DIAGNOSIS — F32A Depression, unspecified: Secondary | ICD-10-CM | POA: Diagnosis not present

## 2023-08-13 DIAGNOSIS — Z981 Arthrodesis status: Secondary | ICD-10-CM | POA: Diagnosis not present

## 2023-08-13 DIAGNOSIS — B1910 Unspecified viral hepatitis B without hepatic coma: Secondary | ICD-10-CM | POA: Diagnosis not present

## 2023-08-13 DIAGNOSIS — Z556 Problems related to health literacy: Secondary | ICD-10-CM | POA: Diagnosis not present

## 2023-08-13 DIAGNOSIS — I714 Abdominal aortic aneurysm, without rupture, unspecified: Secondary | ICD-10-CM | POA: Diagnosis not present

## 2023-08-15 DIAGNOSIS — G8929 Other chronic pain: Secondary | ICD-10-CM | POA: Diagnosis not present

## 2023-08-15 DIAGNOSIS — M545 Low back pain, unspecified: Secondary | ICD-10-CM | POA: Diagnosis not present

## 2023-08-15 DIAGNOSIS — R7989 Other specified abnormal findings of blood chemistry: Secondary | ICD-10-CM | POA: Diagnosis not present

## 2023-08-15 DIAGNOSIS — I10 Essential (primary) hypertension: Secondary | ICD-10-CM | POA: Diagnosis not present

## 2023-08-16 DIAGNOSIS — Z79891 Long term (current) use of opiate analgesic: Secondary | ICD-10-CM | POA: Diagnosis not present

## 2023-08-16 DIAGNOSIS — I110 Hypertensive heart disease with heart failure: Secondary | ICD-10-CM | POA: Diagnosis not present

## 2023-08-16 DIAGNOSIS — I714 Abdominal aortic aneurysm, without rupture, unspecified: Secondary | ICD-10-CM | POA: Diagnosis not present

## 2023-08-16 DIAGNOSIS — Z4789 Encounter for other orthopedic aftercare: Secondary | ICD-10-CM | POA: Diagnosis not present

## 2023-08-16 DIAGNOSIS — Z981 Arthrodesis status: Secondary | ICD-10-CM | POA: Diagnosis not present

## 2023-08-16 DIAGNOSIS — F419 Anxiety disorder, unspecified: Secondary | ICD-10-CM | POA: Diagnosis not present

## 2023-08-16 DIAGNOSIS — Z556 Problems related to health literacy: Secondary | ICD-10-CM | POA: Diagnosis not present

## 2023-08-16 DIAGNOSIS — Z7982 Long term (current) use of aspirin: Secondary | ICD-10-CM | POA: Diagnosis not present

## 2023-08-16 DIAGNOSIS — M5431 Sciatica, right side: Secondary | ICD-10-CM | POA: Diagnosis not present

## 2023-08-16 DIAGNOSIS — I4891 Unspecified atrial fibrillation: Secondary | ICD-10-CM | POA: Diagnosis not present

## 2023-08-16 DIAGNOSIS — M4316 Spondylolisthesis, lumbar region: Secondary | ICD-10-CM | POA: Diagnosis not present

## 2023-08-16 DIAGNOSIS — B1910 Unspecified viral hepatitis B without hepatic coma: Secondary | ICD-10-CM | POA: Diagnosis not present

## 2023-08-16 DIAGNOSIS — F32A Depression, unspecified: Secondary | ICD-10-CM | POA: Diagnosis not present

## 2023-08-16 DIAGNOSIS — I509 Heart failure, unspecified: Secondary | ICD-10-CM | POA: Diagnosis not present

## 2023-08-16 DIAGNOSIS — M48062 Spinal stenosis, lumbar region with neurogenic claudication: Secondary | ICD-10-CM | POA: Diagnosis not present

## 2023-08-20 DIAGNOSIS — N400 Enlarged prostate without lower urinary tract symptoms: Secondary | ICD-10-CM | POA: Diagnosis not present

## 2023-08-20 DIAGNOSIS — G8929 Other chronic pain: Secondary | ICD-10-CM | POA: Diagnosis not present

## 2023-08-20 DIAGNOSIS — M545 Low back pain, unspecified: Secondary | ICD-10-CM | POA: Diagnosis not present

## 2023-08-20 DIAGNOSIS — I10 Essential (primary) hypertension: Secondary | ICD-10-CM | POA: Diagnosis not present

## 2023-08-21 DIAGNOSIS — M5431 Sciatica, right side: Secondary | ICD-10-CM | POA: Diagnosis not present

## 2023-08-21 DIAGNOSIS — F32A Depression, unspecified: Secondary | ICD-10-CM | POA: Diagnosis not present

## 2023-08-21 DIAGNOSIS — I110 Hypertensive heart disease with heart failure: Secondary | ICD-10-CM | POA: Diagnosis not present

## 2023-08-21 DIAGNOSIS — Z981 Arthrodesis status: Secondary | ICD-10-CM | POA: Diagnosis not present

## 2023-08-21 DIAGNOSIS — Z4789 Encounter for other orthopedic aftercare: Secondary | ICD-10-CM | POA: Diagnosis not present

## 2023-08-21 DIAGNOSIS — M4316 Spondylolisthesis, lumbar region: Secondary | ICD-10-CM | POA: Diagnosis not present

## 2023-08-21 DIAGNOSIS — I509 Heart failure, unspecified: Secondary | ICD-10-CM | POA: Diagnosis not present

## 2023-08-21 DIAGNOSIS — I714 Abdominal aortic aneurysm, without rupture, unspecified: Secondary | ICD-10-CM | POA: Diagnosis not present

## 2023-08-21 DIAGNOSIS — I4891 Unspecified atrial fibrillation: Secondary | ICD-10-CM | POA: Diagnosis not present

## 2023-08-21 DIAGNOSIS — Z556 Problems related to health literacy: Secondary | ICD-10-CM | POA: Diagnosis not present

## 2023-08-21 DIAGNOSIS — Z79891 Long term (current) use of opiate analgesic: Secondary | ICD-10-CM | POA: Diagnosis not present

## 2023-08-21 DIAGNOSIS — M48062 Spinal stenosis, lumbar region with neurogenic claudication: Secondary | ICD-10-CM | POA: Diagnosis not present

## 2023-08-21 DIAGNOSIS — B1910 Unspecified viral hepatitis B without hepatic coma: Secondary | ICD-10-CM | POA: Diagnosis not present

## 2023-08-21 DIAGNOSIS — F419 Anxiety disorder, unspecified: Secondary | ICD-10-CM | POA: Diagnosis not present

## 2023-08-21 DIAGNOSIS — Z7982 Long term (current) use of aspirin: Secondary | ICD-10-CM | POA: Diagnosis not present

## 2023-08-23 DIAGNOSIS — F419 Anxiety disorder, unspecified: Secondary | ICD-10-CM | POA: Diagnosis not present

## 2023-08-23 DIAGNOSIS — Z4789 Encounter for other orthopedic aftercare: Secondary | ICD-10-CM | POA: Diagnosis not present

## 2023-08-23 DIAGNOSIS — M48062 Spinal stenosis, lumbar region with neurogenic claudication: Secondary | ICD-10-CM | POA: Diagnosis not present

## 2023-08-23 DIAGNOSIS — B1910 Unspecified viral hepatitis B without hepatic coma: Secondary | ICD-10-CM | POA: Diagnosis not present

## 2023-08-23 DIAGNOSIS — Z556 Problems related to health literacy: Secondary | ICD-10-CM | POA: Diagnosis not present

## 2023-08-23 DIAGNOSIS — F32A Depression, unspecified: Secondary | ICD-10-CM | POA: Diagnosis not present

## 2023-08-23 DIAGNOSIS — I509 Heart failure, unspecified: Secondary | ICD-10-CM | POA: Diagnosis not present

## 2023-08-23 DIAGNOSIS — M4316 Spondylolisthesis, lumbar region: Secondary | ICD-10-CM | POA: Diagnosis not present

## 2023-08-23 DIAGNOSIS — Z79891 Long term (current) use of opiate analgesic: Secondary | ICD-10-CM | POA: Diagnosis not present

## 2023-08-23 DIAGNOSIS — I4891 Unspecified atrial fibrillation: Secondary | ICD-10-CM | POA: Diagnosis not present

## 2023-08-23 DIAGNOSIS — M5431 Sciatica, right side: Secondary | ICD-10-CM | POA: Diagnosis not present

## 2023-08-23 DIAGNOSIS — Z7982 Long term (current) use of aspirin: Secondary | ICD-10-CM | POA: Diagnosis not present

## 2023-08-23 DIAGNOSIS — I110 Hypertensive heart disease with heart failure: Secondary | ICD-10-CM | POA: Diagnosis not present

## 2023-08-23 DIAGNOSIS — I714 Abdominal aortic aneurysm, without rupture, unspecified: Secondary | ICD-10-CM | POA: Diagnosis not present

## 2023-08-23 DIAGNOSIS — Z981 Arthrodesis status: Secondary | ICD-10-CM | POA: Diagnosis not present

## 2023-08-27 DIAGNOSIS — I110 Hypertensive heart disease with heart failure: Secondary | ICD-10-CM | POA: Diagnosis not present

## 2023-08-27 DIAGNOSIS — F419 Anxiety disorder, unspecified: Secondary | ICD-10-CM | POA: Diagnosis not present

## 2023-08-27 DIAGNOSIS — Z556 Problems related to health literacy: Secondary | ICD-10-CM | POA: Diagnosis not present

## 2023-08-27 DIAGNOSIS — M5431 Sciatica, right side: Secondary | ICD-10-CM | POA: Diagnosis not present

## 2023-08-27 DIAGNOSIS — I4891 Unspecified atrial fibrillation: Secondary | ICD-10-CM | POA: Diagnosis not present

## 2023-08-27 DIAGNOSIS — I509 Heart failure, unspecified: Secondary | ICD-10-CM | POA: Diagnosis not present

## 2023-08-27 DIAGNOSIS — Z79891 Long term (current) use of opiate analgesic: Secondary | ICD-10-CM | POA: Diagnosis not present

## 2023-08-27 DIAGNOSIS — I714 Abdominal aortic aneurysm, without rupture, unspecified: Secondary | ICD-10-CM | POA: Diagnosis not present

## 2023-08-27 DIAGNOSIS — B1910 Unspecified viral hepatitis B without hepatic coma: Secondary | ICD-10-CM | POA: Diagnosis not present

## 2023-08-27 DIAGNOSIS — M48062 Spinal stenosis, lumbar region with neurogenic claudication: Secondary | ICD-10-CM | POA: Diagnosis not present

## 2023-08-27 DIAGNOSIS — M4316 Spondylolisthesis, lumbar region: Secondary | ICD-10-CM | POA: Diagnosis not present

## 2023-08-27 DIAGNOSIS — Z7982 Long term (current) use of aspirin: Secondary | ICD-10-CM | POA: Diagnosis not present

## 2023-08-27 DIAGNOSIS — Z981 Arthrodesis status: Secondary | ICD-10-CM | POA: Diagnosis not present

## 2023-08-27 DIAGNOSIS — Z4789 Encounter for other orthopedic aftercare: Secondary | ICD-10-CM | POA: Diagnosis not present

## 2023-08-27 DIAGNOSIS — F32A Depression, unspecified: Secondary | ICD-10-CM | POA: Diagnosis not present

## 2023-08-28 DIAGNOSIS — Z4789 Encounter for other orthopedic aftercare: Secondary | ICD-10-CM | POA: Diagnosis not present

## 2023-08-28 DIAGNOSIS — F32A Depression, unspecified: Secondary | ICD-10-CM | POA: Diagnosis not present

## 2023-08-28 DIAGNOSIS — M48062 Spinal stenosis, lumbar region with neurogenic claudication: Secondary | ICD-10-CM | POA: Diagnosis not present

## 2023-08-28 DIAGNOSIS — B1910 Unspecified viral hepatitis B without hepatic coma: Secondary | ICD-10-CM | POA: Diagnosis not present

## 2023-08-28 DIAGNOSIS — F419 Anxiety disorder, unspecified: Secondary | ICD-10-CM | POA: Diagnosis not present

## 2023-08-28 DIAGNOSIS — Z981 Arthrodesis status: Secondary | ICD-10-CM | POA: Diagnosis not present

## 2023-08-28 DIAGNOSIS — M545 Low back pain, unspecified: Secondary | ICD-10-CM | POA: Diagnosis not present

## 2023-08-28 DIAGNOSIS — R600 Localized edema: Secondary | ICD-10-CM | POA: Diagnosis not present

## 2023-08-28 DIAGNOSIS — M4316 Spondylolisthesis, lumbar region: Secondary | ICD-10-CM | POA: Diagnosis not present

## 2023-08-28 DIAGNOSIS — I10 Essential (primary) hypertension: Secondary | ICD-10-CM | POA: Diagnosis not present

## 2023-08-28 DIAGNOSIS — G8929 Other chronic pain: Secondary | ICD-10-CM | POA: Diagnosis not present

## 2023-08-28 DIAGNOSIS — Z7982 Long term (current) use of aspirin: Secondary | ICD-10-CM | POA: Diagnosis not present

## 2023-08-28 DIAGNOSIS — Z79891 Long term (current) use of opiate analgesic: Secondary | ICD-10-CM | POA: Diagnosis not present

## 2023-08-28 DIAGNOSIS — I4891 Unspecified atrial fibrillation: Secondary | ICD-10-CM | POA: Diagnosis not present

## 2023-08-28 DIAGNOSIS — I509 Heart failure, unspecified: Secondary | ICD-10-CM | POA: Diagnosis not present

## 2023-08-28 DIAGNOSIS — I714 Abdominal aortic aneurysm, without rupture, unspecified: Secondary | ICD-10-CM | POA: Diagnosis not present

## 2023-08-28 DIAGNOSIS — I110 Hypertensive heart disease with heart failure: Secondary | ICD-10-CM | POA: Diagnosis not present

## 2023-08-28 DIAGNOSIS — Z556 Problems related to health literacy: Secondary | ICD-10-CM | POA: Diagnosis not present

## 2023-08-28 DIAGNOSIS — M5431 Sciatica, right side: Secondary | ICD-10-CM | POA: Diagnosis not present

## 2023-09-04 DIAGNOSIS — M5431 Sciatica, right side: Secondary | ICD-10-CM | POA: Diagnosis not present

## 2023-09-04 DIAGNOSIS — M4326 Fusion of spine, lumbar region: Secondary | ICD-10-CM | POA: Diagnosis not present

## 2023-09-04 DIAGNOSIS — M48062 Spinal stenosis, lumbar region with neurogenic claudication: Secondary | ICD-10-CM | POA: Diagnosis not present

## 2023-09-04 DIAGNOSIS — M4316 Spondylolisthesis, lumbar region: Secondary | ICD-10-CM | POA: Diagnosis not present

## 2023-09-04 DIAGNOSIS — Z4789 Encounter for other orthopedic aftercare: Secondary | ICD-10-CM | POA: Diagnosis not present

## 2023-09-12 DIAGNOSIS — I10 Essential (primary) hypertension: Secondary | ICD-10-CM | POA: Diagnosis not present

## 2023-09-12 DIAGNOSIS — R600 Localized edema: Secondary | ICD-10-CM | POA: Diagnosis not present

## 2023-09-12 DIAGNOSIS — M545 Low back pain, unspecified: Secondary | ICD-10-CM | POA: Diagnosis not present

## 2023-09-12 DIAGNOSIS — M549 Dorsalgia, unspecified: Secondary | ICD-10-CM | POA: Diagnosis not present

## 2023-09-12 DIAGNOSIS — G8929 Other chronic pain: Secondary | ICD-10-CM | POA: Diagnosis not present

## 2023-09-12 DIAGNOSIS — Z79899 Other long term (current) drug therapy: Secondary | ICD-10-CM | POA: Diagnosis not present

## 2023-09-17 DIAGNOSIS — Z79899 Other long term (current) drug therapy: Secondary | ICD-10-CM | POA: Diagnosis not present

## 2023-09-26 DIAGNOSIS — Z79899 Other long term (current) drug therapy: Secondary | ICD-10-CM | POA: Diagnosis not present

## 2023-10-11 DIAGNOSIS — J069 Acute upper respiratory infection, unspecified: Secondary | ICD-10-CM | POA: Diagnosis not present

## 2023-10-11 DIAGNOSIS — R600 Localized edema: Secondary | ICD-10-CM | POA: Diagnosis not present

## 2023-10-11 DIAGNOSIS — M545 Low back pain, unspecified: Secondary | ICD-10-CM | POA: Diagnosis not present

## 2023-10-11 DIAGNOSIS — I10 Essential (primary) hypertension: Secondary | ICD-10-CM | POA: Diagnosis not present

## 2023-10-11 DIAGNOSIS — G8929 Other chronic pain: Secondary | ICD-10-CM | POA: Diagnosis not present

## 2023-10-12 DIAGNOSIS — Z79899 Other long term (current) drug therapy: Secondary | ICD-10-CM | POA: Diagnosis not present

## 2023-10-12 DIAGNOSIS — M549 Dorsalgia, unspecified: Secondary | ICD-10-CM | POA: Diagnosis not present

## 2023-10-12 DIAGNOSIS — G8929 Other chronic pain: Secondary | ICD-10-CM | POA: Diagnosis not present

## 2023-11-19 DIAGNOSIS — I350 Nonrheumatic aortic (valve) stenosis: Secondary | ICD-10-CM | POA: Diagnosis not present

## 2023-12-07 ENCOUNTER — Other Ambulatory Visit: Payer: Self-pay

## 2023-12-07 DIAGNOSIS — G4700 Insomnia, unspecified: Secondary | ICD-10-CM | POA: Insufficient documentation

## 2023-12-07 DIAGNOSIS — M545 Low back pain, unspecified: Secondary | ICD-10-CM | POA: Insufficient documentation

## 2023-12-07 DIAGNOSIS — I679 Cerebrovascular disease, unspecified: Secondary | ICD-10-CM | POA: Insufficient documentation

## 2023-12-07 DIAGNOSIS — F3341 Major depressive disorder, recurrent, in partial remission: Secondary | ICD-10-CM | POA: Insufficient documentation

## 2023-12-07 DIAGNOSIS — Z6831 Body mass index (BMI) 31.0-31.9, adult: Secondary | ICD-10-CM | POA: Insufficient documentation

## 2023-12-07 DIAGNOSIS — I10 Essential (primary) hypertension: Secondary | ICD-10-CM | POA: Insufficient documentation

## 2023-12-07 DIAGNOSIS — I5030 Unspecified diastolic (congestive) heart failure: Secondary | ICD-10-CM | POA: Insufficient documentation

## 2023-12-07 DIAGNOSIS — R600 Localized edema: Secondary | ICD-10-CM | POA: Insufficient documentation

## 2023-12-07 DIAGNOSIS — N400 Enlarged prostate without lower urinary tract symptoms: Secondary | ICD-10-CM | POA: Insufficient documentation

## 2023-12-07 DIAGNOSIS — F419 Anxiety disorder, unspecified: Secondary | ICD-10-CM | POA: Insufficient documentation

## 2023-12-07 DIAGNOSIS — L409 Psoriasis, unspecified: Secondary | ICD-10-CM | POA: Insufficient documentation

## 2023-12-11 ENCOUNTER — Ambulatory Visit: Payer: BC Managed Care – PPO

## 2023-12-11 VITALS — BP 114/70 | HR 81 | Ht 72.0 in | Wt 237.0 lb

## 2023-12-11 DIAGNOSIS — I480 Paroxysmal atrial fibrillation: Secondary | ICD-10-CM | POA: Diagnosis not present

## 2023-12-11 DIAGNOSIS — I10 Essential (primary) hypertension: Secondary | ICD-10-CM

## 2023-12-11 DIAGNOSIS — I350 Nonrheumatic aortic (valve) stenosis: Secondary | ICD-10-CM

## 2023-12-11 DIAGNOSIS — I5032 Chronic diastolic (congestive) heart failure: Secondary | ICD-10-CM | POA: Diagnosis not present

## 2023-12-11 DIAGNOSIS — F17200 Nicotine dependence, unspecified, uncomplicated: Secondary | ICD-10-CM

## 2023-12-11 DIAGNOSIS — R079 Chest pain, unspecified: Secondary | ICD-10-CM | POA: Diagnosis not present

## 2023-12-11 HISTORY — DX: Nonrheumatic aortic (valve) stenosis: I35.0

## 2023-12-11 MED ORDER — EMPAGLIFLOZIN 10 MG PO TABS: 10.0000 mg | ORAL_TABLET | Freq: Every day | ORAL | 3 refills | Status: AC

## 2023-12-11 MED ORDER — RIVAROXABAN 20 MG PO TABS: 20.0000 mg | ORAL_TABLET | Freq: Every day | ORAL | 6 refills | Status: AC

## 2023-12-11 NOTE — Progress Notes (Signed)
 Cardiology Consultation:    Date:  12/11/2023   ID:  [facility], DOB July 13, 1966, MRN [id number]  PCP:  Wayne Curia, MD  Cardiologist:  Wayne Corporal Janielle Mittelstadt, MD   Referring MD: [facility] Curia, MD   No chief complaint on file.    ASSESSMENT AND PLAN:   [facility] 58 year old male with history of chronic diastolic CHF, paroxysmal atrial fibrillation s/p ablation [in 2014 and January 2018, loop recorder implanted August 2018 without significant A-fib burden last checked March 2022; not on anticoagulation as he stopped following up on it], normal coronary angiogram in July 2011, coronary atherosclerosis with calcium score 20 in January 2018, mild aortic stenosis, CVA [last episode in 2021, no significant residual weakness], hyperlipidemia, hypertension, GERD, anxiety, smokes tobacco, obesity, chronic back pain, psoriasis, here to establish care with cardiology after his recent acute heart failure related admission at [facility] and subsequent transfer to [facility].  Here accompanied for the visit by his sister. Problem List Items Addressed This Visit     Tobacco use disorder   Congratulated him about his attempts at quitting smoking.  Currently using on nicotine vape and gradually to wean off that.      Chest pain of uncertain etiology   Chest pressure and heaviness he describes could be related to angina versus paroxysmal A-fib episodes. Discussed further evaluation for coronary artery disease to rule out angina. Given functional limitations, will proceed with cardiac PET/CT stress test.       Relevant Orders   NM PET CT CARDIAC PERFUSION MULTI W/ABSOLUTE BLOODFLOW   Paroxysmal atrial fibrillation (HCC) (Chronic)   a. previously failed flecainide/dilt/lopressor; s.p TEE and PVI and afib ablation 08/2013;   S/p ablation January 2018 by [facility]. S/p loop recorder August 2018, last checked March 2022 with no significant A-fib burden. Off anticoagulation and  as he stopped following up and failed to renew.  Denies any side effects or complications.  No significant A-fib burden on recent [facility] visits. Willing to resume anticoagulation. Start Xarelto 20 mg once daily which she was taking in the past. Okay to discontinue aspirin once he resumes Xarelto.  Will assess with Zio patch for 14 days to assess for any significant underlying A-fib rhythm given his description of chest pressure symptoms occurring randomly which could be related to paroxysmal A-fib episodes versus angina.        Relevant Medications   rivaroxaban (XARELTO) 20 MG TABS tablet   Other Relevant Orders   EKG 12-Lead (Completed)   LONG TERM MONITOR (3-14 DAYS)   Magnesium   Essential hypertension (Chronic)   Well can hold on current regimen with amlodipine 5 mg once daily.       Relevant Medications   rivaroxaban (XARELTO) 20 MG TABS tablet   Other Relevant Orders   EKG 12-Lead (Completed)   Basic metabolic panel   Diastolic heart failure (HCC) - Primary   Chronic diastolic CHF with preserved LVEF, echocardiogram February 2025 at [facility] and also at [facility] noted LVEF 65%.  Currently does not appear significantly volume overloaded peripherally in the extremities but does have abdominal distention.  Weight up to 237 pounds today. Discharge weight was 233 pounds on 11-30-2023.  Advised strongly about salt restriction in the diet to below 2 g/day and fluid restriction to below 2 L/day. Advised to increase the furosemide dose to 80 mg in the morning and 40 mg in the afternoon for the next 10 days after  which she will go back to his current dose of 40 mg 2 times a day.  Will obtain blood work with BMP and magnesium in 10 days to assess for any significant interval change in electrolytes and kidney function.  From guideline-directed medical therapy optimization for diastolic heart failure: - Discussed SGLT2 inhibitor role in the setting.   Mechanical of action and potential risk for urinary tract infection and perineal infections discussed, along with any potential for dehydration lightheadedness. He is agreeable Start Jardiance [empagliflozin] 10 mg once daily.  If tolerating well and blood pressure permits will consider starting Entresto at subsequent visits.        Relevant Medications   rivaroxaban (XARELTO) 20 MG TABS tablet   Mild aortic stenosis, by echocardiogram February 2025   Mildly stenosis by echocardiogram consistent clinical findings with systolic ejection murmur. Associated with mild aortic insufficiency on echocardiogram. For monitoring with repeat echocardiogram tentatively in 12 to 18 months.      Relevant Medications   rivaroxaban (XARELTO) 20 MG TABS tablet  Return to clinic tentatively in 2 months.    History of Present Illness:    [facility] is a 58 y.o. male who is being seen today for the evaluation of heart failure at the request of [facility] Curia, MD.   Has history of CHF, paroxysmal atrial fibrillation s/p ablation in 2014 and January 2018 by Dr. Donnamarie [facility] recorder implanted August 2018, no significant A-fib burden, not on anticoagulation as he describes not keeping up with follow-up visits, last follow-up September 2020], coronary angiogram July 2011 normal [[facility]], recently coronary atherosclerosis with calcium score 20 in January 2018 [cardiac CT prior to A-fib ablation], mild aortic stenosis, CVA [last episode in 2021, with right side weakness that gradually improved], hyperlipidemia, hypertension, GERD, anxiety, smokes tobacco, obesity, chronic back pain, psoriasis.  Was recently admitted and discharged from [facility] between 11-29-2023 and 11-30-2023, for acute heart failure with preserved ejection fraction. Discharge weight from the [facility] was 233 pounds. Recent weight at [facility] office visit was 240  pounds. Here in the office today weight was 237 pounds.  Here for the visit today accompanied by his sister Rivka Barbara.  At home he lives by himself.  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  Mentions since discharge from the [facility] feels like he has gained some weight over the last few days.  Describes it as a bloated sensation in his abdomen.  Weight went up to 237 pounds by his home scale today similar to what we have the reading here. He also describes a sensation of chest pressure over the left side occurs randomly can last for 10 to 15 minutes during activity.  Relieved with rest. Functional status significantly limited due to chronic back pain, uses a brace.  Denies any significant instances of palpitations.  At times he may have shot flutter like sensation.  No sustained episodes.  Mentions at home diet comprises of various kinds of foods including shrimp, pizza, meat chops.  He avoids adding any extra salt to his food.  Mentions he continues to take Lasix 40 mg 2 times a day.     Mentions recently quit smoking, for the last 2 to 3 days he has been using no nicotine vape. Does not drink alcohol. Denies any other recreational drug use  EKG in the clinic today shows sinus rhythm heart rate 81/min, normal PR  interval 146 ms, QRS duration 80 ms, QTc 432 ms.  [facility] reported LVEF 65 to 70%, mild aortic stenosis and aortic insufficiency, mean gradient 18 mmHg, dilated atria  echocardiogram to review is from [place] [facility] 11-19-2023 LVEF 55 to 60%, normal RV function and size, mild aortic stenosis V-max 2.6 m/s mean gradient 14 mmHg, dimensionless index 0.5, trace [facility] and TR.  Remote stress test with nuclear imaging from August 2016 low risk study no ischemia.  Last blood work available is from 11-30-2023 sodium 135, potassium 4.2 BUN 31, creatinine 1.06, EGFR 82 Magnesium 1.9 CBC with  hemoglobin 13.3, hematocrit 38, WBC 11.4, platelets 255. High-sensitivity troponins on 11-29-2023 were unremarkable 5 and 6 BNP normal 51 on  09-27-2024 Past Medical History:  Diagnosis Date   A-fib (HCC) 11/14/2016   Anxiety    Atherosclerosis of aorta (HCC) 10/16/2016   Benzodiazepine abuse (HCC) 10/01/2018   BMI 31.0-31.9,adult    BPH (benign prostatic hyperplasia)    Cerebral vascular disease    Cervical disc disease 02/10/2016   Chronic lower back pain    Diastolic heart failure (HCC)    Essential hypertension 10/16/2016   History of syncope 10/16/2016   Dec 2014- Beta blocker intolerant     Hypertension    Insomnia    Lower extremity edema    Medical non-compliance 10/01/2018   Opioid abuse (HCC) 10/01/2018   Paroxysmal atrial fibrillation (HCC)    a. previously failed flecainide/dilt/lopressor;  b. 08/2013 TEE: EF 55-60%, no rwma, no LAA thrombus or veg;  c. s/p PVI and afib ablation 08/2013;  d. chronic xarelto.   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)    Tobacco use disorder 04/23/2013    Past Surgical History:  Procedure Laterality Date   APPENDECTOMY     ATRIAL FIBRILLATION ABLATION N/A 08/19/2013   Procedure: ATRIAL FIBRILLATION ABLATION;  Surgeon: Gardiner Rhyme, MD;  Location: [facility] CATH LAB;  Service: Cardiovascular;  Laterality: N/A;   CARDIAC CATHETERIZATION  2011   [place] [facility], Pt reports no CAD   CERVICAL LAMINECTOMY     CHOLECYSTECTOMY     ELECTROPHYSIOLOGIC STUDY N/A 11/14/2016   Procedure: Atrial Fibrillation Ablation;  Surgeon: Hillis Range, MD;  Location: [facility];  Service: Cardiovascular;  Laterality: N/A;   HERNIA REPAIR     KNEE ARTHROSCOPY Bilateral    LOOP RECORDER INSERTION N/A 05/24/2017   Procedure: LOOP RECORDER INSERTION;  Surgeon: Hillis Range, MD;  Location: [facility] INVASIVE CV LAB;  Service: Cardiovascular;  Laterality: N/A;   TEE WITHOUT CARDIOVERSION N/A 08/18/2013   Procedure: TRANSESOPHAGEAL ECHOCARDIOGRAM (TEE);  Surgeon: Lewayne Bunting, MD;  Location: [facility];  Service: Cardiovascular;  Laterality: N/A;   WRIST SURGERY Bilateral     Current Medications: Current Meds  Medication Sig   ALPRAZolam (XANAX) 0.5 MG tablet Take 0.5 mg by mouth 4 (four) times daily as needed for anxiety.   amLODipine (NORVASC) 5 MG tablet Take 5 mg by mouth daily.   calcitonin, salmon, (MIACALCIN/FORTICAL) 200 UNIT/ACT nasal spray Place 1 spray into alternate nostrils daily.   empagliflozin (JARDIANCE) 10 MG TABS tablet Take 1 tablet (10 mg total) by mouth daily before breakfast.   furosemide (LASIX) 40 MG tablet Take 40 mg by mouth 2 (two) times daily as needed for  fluid or edema.   gabapentin (NEURONTIN) 800 MG tablet Take 800 mg by mouth 3 (three) times daily as needed (pain).   oxyCODONE-acetaminophen (PERCOCET) 10-325 MG tablet Take 1 tablet by mouth every 8 (eight) hours as needed for pain.   rivaroxaban (XARELTO) 20 MG TABS tablet Take 1 tablet (20 mg total) by mouth daily with supper.   sertraline (ZOLOFT) 100 MG tablet Take 100 mg by mouth daily.   sodium chloride (OCEAN) 0.65 % nasal spray Place 1 spray into the nose as needed for congestion.   tamsulosin (FLOMAX) 0.4 MG CAPS capsule Take 0.4 mg by mouth daily.   traZODone (DESYREL) 100 MG tablet Take 100 mg by mouth at bedtime.   [DISCONTINUED] aspirin EC 81 MG tablet Take 1 tablet (81 mg total) by mouth daily. Swallow whole.     Allergies:   Pregabalin and Codeine   Social History   Socioeconomic History   Marital status: Married    Spouse name: Not on file   Number of children: 2   Years of education: Not on file   Highest education level: Not on file  Occupational History   Occupation: communications  Tobacco Use   Smoking status: Every Day    Current packs/day:  0.00    Types: Cigars, Cigarettes    Last attempt to quit: 12/24/2012    Years since quitting: 10.9   Smokeless tobacco: Never   Tobacco comments:    3 cigars/day  Vaping Use   Vaping status: Never Used  Substance and Sexual Activity   Alcohol use: No    Alcohol/week: 0.0 standard drinks of alcohol   Drug use: No   Sexual activity: Not on file  Other Topics Concern   Not on file  Social History Narrative   Pt lives in [place] with spouse.  Owns a Nurse, adult.   Social Drivers of Corporate investment banker Strain: Not on file  Food Insecurity: Unknown (11/29/2023)   [facility] from [facility]   Hunger Vital Sign    Worried About Running Out of Food in the Last Year: Patient declined to answer    Ran Out of Food in the Last Year: Patient declined to answer  Transportation Needs: Not on file (11/29/2023)  Physical Activity: Not on file  Stress: Not on file  Social Connections: Not on file     Family History: The patient's family history includes Heart attack in his father; Heart failure in his father; Hypertension in his mother; Stroke in his father, paternal grandfather, and paternal uncle. ROS:   Please see the history of present illness.    All 14 point review of systems negative except as described per history of present illness.  EKGs/Labs/Other Studies Reviewed:    The following studies were reviewed today:   EKG:  EKG Interpretation Date/Time:  Tuesday December 11 2023 09:03:14 EST Ventricular Rate:  81 PR Interval:  146 QRS Duration:  80 QT Interval:  372 QTC Calculation: 432 R Axis:   47  Text Interpretation: Normal sinus rhythm Normal ECG When compared with ECG of 18-Dec-2016 14:27, Criteria for Septal infarct are no longer Present Confirmed by Huntley Dec reddy [phone]) on 12/11/2023 9:14:22 AM    Recent Labs: 06/18/2023: BUN 23; Creatinine, Ser 1.22; Hemoglobin 13.3; Platelets 178; Potassium 3.4; Sodium 135  Recent Lipid Panel     Component Value Date/Time   CHOL 161 10/17/2016 0246   TRIG 104 10/17/2016 0246   HDL 35 (L) 10/17/2016 0246  CHOLHDL 4.6 10/17/2016 0246   VLDL 21 10/17/2016 0246   LDLCALC 105 (H) 10/17/2016 0246    Physical Exam:    VS:  BP 114/70 (BP Location: Left Arm, Patient Position: Sitting, Cuff Size: Normal)   Pulse 81   Ht 6' (1.829 m)   Wt 237 lb (107.5 kg)   SpO2 92%   BMI 32.14 kg/m     Wt Readings from Last 3 Encounters:  12/11/23 237 lb (107.5 kg)  07/13/23 227 lb 6.4 oz (103.1 kg)  06/18/23 205 lb (93 kg)     GENERAL:  Well nourished, well developed in no acute distress NECK: No JVD; No carotid bruits CARDIAC: RRR, S1 and S2 present, 4/6 ejection systolic murmur best heard right upper sternal border. CHEST:  Clear to auscultation without rales, wheezing or rhonchi Abdomen: Soft, distended, nontender Extremities: No pitting pedal edema. Pulses bilaterally symmetric with radial 2+ and dorsalis pedis 2+ NEUROLOGIC:  Alert and oriented x 3 Skin: Erythematous rash over the trunk and hands consistent with his history of psoriasis.  Medication Adjustments/Labs and Tests Ordered: Current medicines are reviewed at length with the patient today.  Concerns regarding medicines are outlined above.  Orders Placed This Encounter  Procedures   NM PET CT CARDIAC PERFUSION MULTI W/ABSOLUTE BLOODFLOW   Basic metabolic panel   Magnesium   LONG TERM MONITOR (3-14 DAYS)   EKG 12-Lead   Meds ordered this encounter  Medications   rivaroxaban (XARELTO) 20 MG TABS tablet    Sig: Take 1 tablet (20 mg total) by mouth daily with supper.    Dispense:  30 tablet    Refill:  6   empagliflozin (JARDIANCE) 10 MG TABS tablet    Sig: Take 1 tablet (10 mg total) by mouth daily before breakfast.    Dispense:  30 tablet    Refill:  3    Signed, Kahlia Lagunes reddy Madison Albea, MD, MPH, [place]. 12/11/2023 9:55 AM    [facility]

## 2023-12-11 NOTE — Assessment & Plan Note (Signed)
 Chest pressure and heaviness he describes could be related to angina versus paroxysmal A-fib episodes. Discussed further evaluation for coronary artery disease to rule out angina. Given functional limitations, will proceed with cardiac PET/CT stress test.

## 2023-12-11 NOTE — Assessment & Plan Note (Addendum)
 a. previously failed flecainide/dilt/lopressor; s.p TEE and PVI and afib ablation 08/2013;   S/p ablation January 2018 by Dr. Johney Frame. S/p loop recorder August 2018, last checked March 2022 with no significant A-fib burden. Off anticoagulation and as he stopped following up and failed to renew.  Denies any side effects or complications.  No significant A-fib burden on recent hospital visits. Willing to resume anticoagulation. Start Xarelto 20 mg once daily which she was taking in the past. Okay to discontinue aspirin once he resumes Xarelto.  Will assess with Zio patch for 14 days to assess for any significant underlying A-fib rhythm given his description of chest pressure symptoms occurring randomly which could be related to paroxysmal A-fib episodes versus angina.

## 2023-12-11 NOTE — Assessment & Plan Note (Signed)
 Mildly stenosis by echocardiogram consistent clinical findings with systolic ejection murmur. Associated with mild aortic insufficiency on echocardiogram. For monitoring with repeat echocardiogram tentatively in 12 to 18 months.

## 2023-12-11 NOTE — Patient Instructions (Signed)
 Medication Instructions:   START: Jardiance 10mg  1 tablet daily  START: Xarelto 20mg  1 tablet daily  STOP: Aspirin  INCREASE: Lasix 40mg  2 tablets in the morning and 1 in the afternoon for 7 days then resume normal schedule  DIET:  Less than 2gm of salt  2 liters of fluids daily   Lab Work: Your physician recommends that you return for lab work in: 10 days You need to have labs done when you are fasting.  You can come Monday through Friday 8:30 am to 12:00 pm and 1:15 to 4:30. You do not need to make an appointment as the order has already been placed. The labs you are going to have done are BMET, MGM   Testing/Procedures:   WHY IS MY DOCTOR PRESCRIBING ZIO? The Zio system is proven and trusted by physicians to detect and diagnose irregular heart rhythms -- and has been prescribed to hundreds of thousands of patients.  The FDA has cleared the Zio system to monitor for many different kinds of irregular heart rhythms. In a study, physicians were able to reach a diagnosis 90% of the time with the Zio system1.  You can wear the Zio monitor -- a small, discreet, comfortable patch -- during your normal day-to-day activity, including while you sleep, shower, and exercise, while it records every single heartbeat for analysis.  1Barrett, P., et al. Comparison of 24 Hour Holter Monitoring Versus 14 Day Novel Adhesive Patch Electrocardiographic Monitoring. American Journal of Medicine, 2014.  ZIO VS. HOLTER MONITORING The Zio monitor can be comfortably worn for up to 14 days. Holter monitors can be worn for 24 to 48 hours, limiting the time to record any irregular heart rhythms you may have. Zio is able to capture data for the 51% of patients who have their first symptom-triggered arrhythmia after 48 hours.1  LIVE WITHOUT RESTRICTIONS The Zio ambulatory cardiac monitor is a small, unobtrusive, and water-resistant patch--you might even forget you're wearing it. The Zio monitor records and  stores every beat of your heart, whether you're sleeping, working out, or showering.      Please report to Radiology at the [facility] Main Entrance 30 minutes early for your test.  [address] [place], [place] [address]                       How to Prepare for Your Cardiac PET/CT Stress Test:  Nothing to eat or drink, except water, 3 hours prior to arrival time.  NO caffeine/decaffeinated products, or chocolate 12 hours prior to arrival. (Please note decaffeinated beverages (teas/coffees) still contain caffeine).  If you have caffeine within 12 hours prior, the test will need to be rescheduled.  Medication instructions: Do not take erectile dysfunction medications for 72 hours prior to test (sildenafil, tadalafil) Do not take nitrates (isosorbide mononitrate, Ranexa) the day before or day of test Do not take tamsulosin the day before or morning of test Hold theophylline containing medications for 12 hours. Hold Dipyridamole 48 hours prior to the test.  Diabetic Preparation: If able to eat breakfast prior to 3 hour fasting, you may take all medications, including your insulin. Do not worry if you miss your breakfast dose of insulin - start at your next meal. If you do not eat prior to 3 hour fast-Hold all diabetes (oral and insulin) medications. Patients who wear a continuous glucose monitor MUST remove the device prior to scanning.  You may take your remaining medications with water.  NO perfume, cologne or lotion on chest or abdomen area.   Total time is 1 to 2 hours; you may want to bring reading material for the waiting time.  IF YOU THINK YOU MAY BE PREGNANT, OR ARE NURSING PLEASE INFORM THE TECHNOLOGIST.  In preparation for your appointment, medication and supplies will be purchased.  Appointment availability is limited, so if you need to cancel or reschedule, please call the Radiology Department Scheduler at [phone] 24 hours in advance to avoid a  cancellation fee of $100.00  What to Expect When you Arrive:  Once you arrive and check in for your appointment, you will be taken to a preparation room within the Radiology Department.  A technologist or Nurse will obtain your medical history, verify that you are correctly prepped for the exam, and explain the procedure.  Afterwards, an IV will be started in your arm and electrodes will be placed on your skin for EKG monitoring during the stress portion of the exam. Then you will be escorted to the PET/CT scanner.  There, staff will get you positioned on the scanner and obtain a blood pressure and EKG.  During the exam, you will continue to be connected to the EKG and blood pressure machines.  A small, safe amount of a radioactive tracer will be injected in your IV to obtain a series of pictures of your heart along with an injection of a stress agent.    After your Exam:  It is recommended that you eat a meal and drink a caffeinated beverage to counter act any effects of the stress agent.  Drink plenty of fluids for the remainder of the day and urinate frequently for the first couple of hours after the exam.  Your doctor will inform you of your test results within 7-10 business days.  For more information and frequently asked questions, please visit our website: [email or web address]  For questions about your test or how to prepare for your test, please call: Cardiac Imaging Nurse Navigators Office: [phone]    Follow-Up: At [facility], you and your health needs are our priority.  As part of our continuing mission to provide you with exceptional heart care, we have created designated Provider Care Teams.  These Care Teams include your primary Cardiologist (physician) and Advanced Practice Providers (APPs -  Physician Assistants and Nurse Practitioners) who all work together to provide you with the care you need, when you need it.  We recommend signing up for the patient  portal called "MyChart".  Sign up information is provided on this After Visit Summary.  MyChart is used to connect with patients for Virtual Visits (Telemedicine).  Patients are able to view lab/test results, encounter notes, upcoming appointments, etc.  Non-urgent messages can be sent to your provider as well.   To learn more about what you can do with MyChart, go to [email or web address].    Your next appointment:   2 month(s)  The format for your next appointment:   In Person  Provider:   Huntley Dec, MD   Other Instructions NA

## 2023-12-11 NOTE — Assessment & Plan Note (Signed)
 Well can hold on current regimen with amlodipine 5 mg once daily.

## 2023-12-11 NOTE — Assessment & Plan Note (Signed)
 Congratulated him about his attempts at quitting smoking.  Currently using on nicotine vape and gradually to wean off that.

## 2023-12-11 NOTE — Assessment & Plan Note (Signed)
 Chronic diastolic CHF with preserved LVEF, echocardiogram February 2025 at [facility] and also at [facility] noted LVEF 65%.  Currently does not appear significantly volume overloaded peripherally in the extremities but does have abdominal distention.  Weight up to 237 pounds today. Discharge weight was 233 pounds on 11-30-2023.  Advised strongly about salt restriction in the diet to below 2 g/day and fluid restriction to below 2 L/day. Advised to increase the furosemide dose to 80 mg in the morning and 40 mg in the afternoon for the next 10 days after which she will go back to his current dose of 40 mg 2 times a day.  Will obtain blood work with BMP and magnesium in 10 days to assess for any significant interval change in electrolytes and kidney function.  From guideline-directed medical therapy optimization for diastolic heart failure: - Discussed SGLT2 inhibitor role in the setting.  Mechanical of action and potential risk for urinary tract infection and perineal infections discussed, along with any potential for dehydration lightheadedness. He is agreeable Start Jardiance [empagliflozin] 10 mg once daily.  If tolerating well and blood pressure permits will consider starting Entresto at subsequent visits.

## 2024-01-31 ENCOUNTER — Other Ambulatory Visit: Payer: Self-pay | Admitting: Orthopedic Surgery

## 2024-01-31 DIAGNOSIS — M4326 Fusion of spine, lumbar region: Secondary | ICD-10-CM

## 2024-02-04 ENCOUNTER — Ambulatory Visit
Admission: RE | Admit: 2024-02-04 | Discharge: 2024-02-04 | Disposition: A | Discharge status: Home / Self Care | Source: Ambulatory Visit | Attending: Orthopedic Surgery | Admitting: Orthopedic Surgery

## 2024-02-04 ENCOUNTER — Encounter: Payer: Self-pay | Admitting: Orthopedic Surgery

## 2024-02-04 DIAGNOSIS — M4326 Fusion of spine, lumbar region: Secondary | ICD-10-CM

## 2024-02-05 DIAGNOSIS — I480 Paroxysmal atrial fibrillation: Secondary | ICD-10-CM | POA: Diagnosis not present

## 2024-02-05 NOTE — Progress Notes (Signed)
 Please inform him the heart monitor results show no evidence of atrial fibrillation.  Overall burden of extra beats from top and bottom chambers of the heart rate is less than 1% [rare].  There were few instances of back-to-back extra beats from the top chambers of the heart with the longest such episode lasting 9 beats.  This is not significant.  There are 3 different times he activated the device for symptoms correlated with normal heart rate and rhythm.  Overall these results are reassuring and I will discuss these further at subsequent office visit.  Thank you

## 2024-02-11 ENCOUNTER — Ambulatory Visit: Payer: BC Managed Care – PPO

## 2024-03-04 ENCOUNTER — Encounter (HOSPITAL_COMMUNITY): Payer: Self-pay

## 2024-03-04 ENCOUNTER — Telehealth (HOSPITAL_COMMUNITY): Payer: Self-pay | Admitting: Emergency Medicine

## 2024-03-04 NOTE — Telephone Encounter (Signed)
Attempted to call patient regarding upcoming cardiac PET appointment. Left message on voicemail with name and callback number Aidan Moten RN Navigator Cardiac Imaging [facility] [phone] Office [phone] Cell  

## 2024-03-05 ENCOUNTER — Ambulatory Visit (HOSPITAL_COMMUNITY)

## 2024-06-09 ENCOUNTER — Other Ambulatory Visit: Payer: Self-pay

## 2024-06-09 DIAGNOSIS — I7143 Infrarenal abdominal aortic aneurysm, without rupture: Secondary | ICD-10-CM

## 2024-07-07 ENCOUNTER — Ambulatory Visit (HOSPITAL_BASED_OUTPATIENT_CLINIC_OR_DEPARTMENT_OTHER)
Admission: RE | Admit: 2024-07-07 | Discharge: 2024-07-07 | Disposition: A | Discharge status: Home / Self Care | Source: Ambulatory Visit | Attending: Vascular Surgery | Admitting: Radiology

## 2024-07-07 DIAGNOSIS — I7143 Infrarenal abdominal aortic aneurysm, without rupture: Secondary | ICD-10-CM

## 2024-07-07 MED ORDER — IOHEXOL 350 MG/ML SOLN
100.0000 mL | Freq: Once | INTRAVENOUS | Status: AC | PRN
  Administered 2024-07-07: 100 mL via INTRAVENOUS

## 2024-07-17 NOTE — Progress Notes (Unsigned)
 Patient ID: Wayne Pollard, male   DOB: November 13, 1965, 58 y.o.   MRN: [id number]  Reason for Consult: No chief complaint on file.   Referred by Jama Chow, MD  Subjective:     HPI Wayne Pollard is a 58 y.o. male presenting for follow-up.  He had an incidental finding of a AAA on and MRI lumbar spine last year that was noted to be 4.3 cm.  He was asymptomatic and started on best medical management.  This is his annual follow-up. ***  Past Medical History:  Diagnosis Date   A-fib (HCC) 11/14/2016   Anxiety    Atherosclerosis of aorta 10/16/2016   Benzodiazepine abuse (HCC) 10/01/2018   BMI 31.0-31.9,adult    BPH (benign prostatic hyperplasia)    Cerebral vascular disease    Cervical disc disease 02/10/2016   Chest pain of uncertain etiology 06/19/2013   Chronic lower back pain    Diastolic heart failure (HCC)    Essential hypertension 10/16/2016   History of syncope 10/16/2016   Dec 2014- Beta blocker intolerant     Hypertension    Insomnia    Lower extremity edema    Medical non-compliance 10/01/2018   Mild aortic stenosis, by echocardiogram February 2025 12/11/2023   Opioid abuse (HCC) 10/01/2018   Paroxysmal atrial fibrillation (HCC)    a. previously failed flecainide /dilt/lopressor ;  b. 08/2013 TEE: EF 55-60%, no rwma, no LAA thrombus or veg;  c. s/p PVI and afib ablation 08/2013;  d. chronic xarelto .   Paroxysmal atrial fibrillation with rapid ventricular response (HCC) 10/16/2016   Polypharmacy 10/01/2018   Psoriasis    Psoriatic arthritis (HCC) 04/23/2013   Recurrent major depressive disorder in partial remission    Tobacco use disorder 04/23/2013   Family History  Problem Relation Age of Onset   Heart attack Father        died @ 75.   Heart failure Father    Stroke Father    Hypertension Mother    Stroke Paternal Grandfather    Stroke Paternal Uncle    Past Surgical History:  Procedure Laterality Date   APPENDECTOMY     ATRIAL FIBRILLATION ABLATION N/A  08/19/2013   Procedure: ATRIAL FIBRILLATION ABLATION;  Surgeon: Lynwood JONETTA Rakers, MD;  Location: [facility] CATH LAB;  Service: Cardiovascular;  Laterality: N/A;   CARDIAC CATHETERIZATION  2011   [facility], Pt reports no CAD   CERVICAL LAMINECTOMY     CHOLECYSTECTOMY     ELECTROPHYSIOLOGIC STUDY N/A 11/14/2016   Procedure: Atrial Fibrillation Ablation;  Surgeon: Lynwood Rakers, MD;  Location: [facility] INVASIVE CV LAB;  Service: Cardiovascular;  Laterality: N/A;   HERNIA REPAIR     KNEE ARTHROSCOPY Bilateral    LOOP RECORDER INSERTION N/A 05/24/2017   Procedure: LOOP RECORDER INSERTION;  Surgeon: Rakers Lynwood, MD;  Location: [facility] INVASIVE CV LAB;  Service: Cardiovascular;  Laterality: N/A;   TEE WITHOUT CARDIOVERSION N/A 08/18/2013   Procedure: TRANSESOPHAGEAL ECHOCARDIOGRAM (TEE);  Surgeon: Redell GORMAN Shallow, MD;  Location: [facility] ENDOSCOPY;  Service: Cardiovascular;  Laterality: N/A;   WRIST SURGERY Bilateral     Short Social History:  Social History   Tobacco Use   Smoking status: Every Day    Current packs/day: 0.00    Types: Cigars, Cigarettes    Last attempt to quit: 12/24/2012    Years since quitting: 11.5   Smokeless tobacco: Never   Tobacco comments:    3 cigars/day  Substance Use Topics   Alcohol use: No  Alcohol/week: 0.0 standard drinks of alcohol    Allergies  Allergen Reactions   Pregabalin     Mouth blisters   Codeine Nausea And Vomiting    Current Outpatient Medications  Medication Sig Dispense Refill   ALPRAZolam  (XANAX ) 0.5 MG tablet Take 0.5 mg by mouth 4 (four) times daily as needed for anxiety.     amLODipine  (NORVASC ) 5 MG tablet Take 5 mg by mouth daily.     calcitonin, salmon, (MIACALCIN/FORTICAL) 200 UNIT/ACT nasal spray Place 1 spray into alternate nostrils daily.     empagliflozin  (JARDIANCE ) 10 MG TABS tablet Take 1 tablet (10 mg total) by mouth daily before breakfast. 30 tablet 3   furosemide (LASIX) 40 MG tablet Take 40 mg by mouth 2 (two) times daily as  needed for fluid or edema.     gabapentin (NEURONTIN) 800 MG tablet Take 800 mg by mouth 3 (three) times daily as needed (pain).     oxyCODONE -acetaminophen  (PERCOCET) 10-325 MG tablet Take 1 tablet by mouth every 8 (eight) hours as needed for pain.     rivaroxaban  (XARELTO ) 20 MG TABS tablet Take 1 tablet (20 mg total) by mouth daily with supper. 30 tablet 6   sertraline (ZOLOFT) 100 MG tablet Take 100 mg by mouth daily.     sodium chloride  (OCEAN) 0.65 % nasal spray Place 1 spray into the nose as needed for congestion.     tamsulosin (FLOMAX) 0.4 MG CAPS capsule Take 0.4 mg by mouth daily.     traZODone (DESYREL) 100 MG tablet Take 100 mg by mouth at bedtime.     No current facility-administered medications for this visit.    REVIEW OF SYSTEMS  All other systems were reviewed and are negative     Objective:  Objective   There were no vitals filed for this visit. There is no height or weight on file to calculate BMI.  Physical Exam General: no acute distress Cardiac: hemodynamically stable Pulm: normal work of breathing Abdomen: non-tender, no pulsatile mass*** Neuro: alert, no focal deficit Extremities: no edema, cyanosis or wounds*** Vascular:   Right: ***  Left: ***  Data: CTA independently reviewed Infrarenal AAA with a maximal diameter of 4.2 cm on the coronal images     Assessment/Plan:   Wayne Pollard is a 58 y.o. male with an asymptomatic infrarenal AAA.  The maximal diameter is stable at about 4.2 cm when compared to last year's MRI lumbar spine which measured approximately 4.3 cm.  Continue best medical management and plan to continue with annual surveillance. Follow-up in 1 year with repeat CTA abdomen pelvis   Norman GORMAN Serve MD [facility]

## 2024-07-18 ENCOUNTER — Encounter: Payer: Self-pay | Admitting: Vascular Surgery

## 2024-07-18 ENCOUNTER — Ambulatory Visit: Attending: Vascular Surgery | Admitting: Vascular Surgery

## 2024-07-18 VITALS — BP 113/70 | HR 59 | Temp 98.1°F | Resp 18 | Ht 72.0 in | Wt 203.7 lb

## 2024-07-18 DIAGNOSIS — I7143 Infrarenal abdominal aortic aneurysm, without rupture: Secondary | ICD-10-CM

## 2024-08-01 ENCOUNTER — Other Ambulatory Visit: Payer: Self-pay

## 2024-08-01 ENCOUNTER — Emergency Department (HOSPITAL_COMMUNITY)

## 2024-08-01 ENCOUNTER — Emergency Department (HOSPITAL_COMMUNITY): Admission: EM | Admit: 2024-08-01 | Discharge: 2024-08-01 | Disposition: A | Discharge status: Home / Self Care

## 2024-08-01 DIAGNOSIS — R10A1 Flank pain, right side: Secondary | ICD-10-CM

## 2024-08-01 DIAGNOSIS — Z7901 Long term (current) use of anticoagulants: Secondary | ICD-10-CM | POA: Diagnosis not present

## 2024-08-01 DIAGNOSIS — K5900 Constipation, unspecified: Secondary | ICD-10-CM | POA: Insufficient documentation

## 2024-08-01 DIAGNOSIS — R109 Unspecified abdominal pain: Secondary | ICD-10-CM | POA: Diagnosis present

## 2024-08-01 LAB — CBC WITH DIFFERENTIAL/PLATELET
Abs Immature Granulocytes: 0.01 K/uL (ref 0.00–0.07)
Basophils Absolute: 0 K/uL (ref 0.0–0.1)
Basophils Relative: 1 %
Eosinophils Absolute: 0.1 K/uL (ref 0.0–0.5)
Eosinophils Relative: 2 %
HCT: 40.4 % (ref 39.0–52.0)
Hemoglobin: 13.6 g/dL (ref 13.0–17.0)
Immature Granulocytes: 0 %
Lymphocytes Relative: 17 %
Lymphs Abs: 1 K/uL (ref 0.7–4.0)
MCH: 31.8 pg (ref 26.0–34.0)
MCHC: 33.7 g/dL (ref 30.0–36.0)
MCV: 94.4 fL (ref 80.0–100.0)
Monocytes Absolute: 0.5 K/uL (ref 0.1–1.0)
Monocytes Relative: 9 %
Neutro Abs: 4 K/uL (ref 1.7–7.7)
Neutrophils Relative %: 71 %
Platelets: 130 K/uL — ABNORMAL LOW (ref 150–400)
RBC: 4.28 MIL/uL (ref 4.22–5.81)
RDW: 12.5 % (ref 11.5–15.5)
WBC: 5.7 K/uL (ref 4.0–10.5)
nRBC: 0 % (ref 0.0–0.2)

## 2024-08-01 LAB — URINALYSIS, ROUTINE W REFLEX MICROSCOPIC
Bilirubin Urine: NEGATIVE
Glucose, UA: NEGATIVE mg/dL
Ketones, ur: NEGATIVE mg/dL
Leukocytes,Ua: NEGATIVE
Nitrite: NEGATIVE
Protein, ur: NEGATIVE mg/dL
Specific Gravity, Urine: 1.02 (ref 1.005–1.030)
pH: 5 (ref 5.0–8.0)

## 2024-08-01 LAB — COMPREHENSIVE METABOLIC PANEL WITH GFR
ALT: 12 U/L (ref 0–44)
AST: 16 U/L (ref 15–41)
Albumin: 3.4 g/dL — ABNORMAL LOW (ref 3.5–5.0)
Alkaline Phosphatase: 74 U/L (ref 38–126)
Anion gap: 10 (ref 5–15)
BUN: 13 mg/dL (ref 6–20)
CO2: 23 mmol/L (ref 22–32)
Calcium: 8.8 mg/dL — ABNORMAL LOW (ref 8.9–10.3)
Chloride: 104 mmol/L (ref 98–111)
Creatinine, Ser: 1.04 mg/dL (ref 0.61–1.24)
GFR, Estimated: >60 mL/min (ref >60)
Glucose, Bld: 115 mg/dL — ABNORMAL HIGH (ref 70–99)
Potassium: 4.3 mmol/L (ref 3.5–5.1)
Sodium: 137 mmol/L (ref 135–145)
Total Bilirubin: 0.5 mg/dL (ref 0.0–1.2)
Total Protein: 6.3 g/dL — ABNORMAL LOW (ref 6.5–8.1)

## 2024-08-01 LAB — LIPASE, BLOOD: Lipase: 22 U/L (ref 11–51)

## 2024-08-01 MED ORDER — KETOROLAC TROMETHAMINE 15 MG/ML IJ SOLN
15.0000 mg | Freq: Once | INTRAMUSCULAR | Status: DC
  Filled 2024-08-01: qty 1

## 2024-08-01 MED ORDER — IOHEXOL 350 MG/ML SOLN
100.0000 mL | Freq: Once | INTRAVENOUS | Status: AC | PRN
  Administered 2024-08-01: 100 mL via INTRAVENOUS

## 2024-08-01 MED ORDER — HYDROCODONE-ACETAMINOPHEN 5-325 MG PO TABS
1.0000 | ORAL_TABLET | Freq: Once | ORAL | Status: AC
  Administered 2024-08-01: 1 via ORAL
  Filled 2024-08-01: qty 1

## 2024-08-01 MED ORDER — OXYCODONE-ACETAMINOPHEN 5-325 MG PO TABS
1.0000 | ORAL_TABLET | Freq: Once | ORAL | Status: AC
  Administered 2024-08-01: 1 via ORAL
  Filled 2024-08-01: qty 1

## 2024-08-01 MED ORDER — METHOCARBAMOL 500 MG PO TABS
500.0000 mg | ORAL_TABLET | Freq: Once | ORAL | Status: DC
  Filled 2024-08-01: qty 1

## 2024-08-01 MED ORDER — METHOCARBAMOL 500 MG PO TABS: 500.0000 mg | ORAL_TABLET | Freq: Two times a day (BID) | ORAL | 0 refills | Status: AC

## 2024-08-01 MED ORDER — LIDOCAINE 5 % EX PTCH: 1.0000 | MEDICATED_PATCH | CUTANEOUS | 0 refills | Status: AC

## 2024-08-01 NOTE — ED Triage Notes (Addendum)
 Pt. Stated, I went to [facility] and they sent me to [facility] and did a scan and saw nothing but blood in my urine, the pain has gotten worse and worse. At 630 I took Oxycodone  15mg ., it was medication from a previous back surgery

## 2024-08-01 NOTE — ED Notes (Signed)
 Extra SST & Blue top drawn

## 2024-08-01 NOTE — ED Notes (Signed)
 Pt approached nurse station inquiring about when he will be seen next. RN informed him we are seeing pt as fast as we can. Pt informed anything urgent from his labs or changes in his vital signs will be monitored and staff will be alerted. Pt verbalized understanding and states he will walk around for fresh air.

## 2024-08-01 NOTE — ED Provider Notes (Signed)
 [place] [facility] DEPARTMENT AT [facility] Provider Note   CSN: [id number] Arrival date & time: 08/01/24  1020    Patient presents with: right abdominal pain and Right flank pain   Wayne Pollard is a 58 y.o. male here for evaluation of right sided abdominal pain.  Started on Tuesday.  Initially seen at urgent care and then sent to [facility] ED.  He states he had a CT scan there which did not show anything.  He was told he had some blood in his urine and he possibly had passed a stone.  He was not started on antibiotics.  Since then he has been taking some home oxycodone  that he had leftover from prior back surgery.  Pain worse with movement.  He denies any recent falls or injuries.  No fever, chest pain, shortness of breath, nausea, vomiting, dysuria, frequency, urgency, diarrhea, constipation, blood in stool.  He is chronically anticoagulated.  He has a known history of AAA.  Patient states his pain improves when he is not moving.  He has had a prior cholecystectomy.   HPI     Prior to Admission medications   Medication Sig Start Date End Date Taking? Authorizing Provider  lidocaine  (LIDODERM ) 5 % Place 1 patch onto the skin daily. Remove & Discard patch within 12 hours or as directed by MD 08/01/24  Yes Shanon Becvar A, PA-C  methocarbamol (ROBAXIN) 500 MG tablet Take 1 tablet (500 mg total) by mouth 2 (two) times daily. 08/01/24  Yes Deshia Vanderhoof A, PA-C  ALPRAZolam  (XANAX ) 0.5 MG tablet Take 0.5 mg by mouth 4 (four) times daily as needed for anxiety.    [provider]  amLODipine  (NORVASC ) 5 MG tablet Take 5 mg by mouth daily.    [provider]  calcitonin, salmon, (MIACALCIN/FORTICAL) 200 UNIT/ACT nasal spray Place 1 spray into alternate nostrils daily. 12/07/23   [provider]  celecoxib (CELEBREX) 200 MG capsule Take 200 mg by mouth daily.    [provider]  empagliflozin  (JARDIANCE ) 10 MG TABS tablet Take 1 tablet (10 mg  total) by mouth daily before breakfast. 12/11/23   Madireddy, Alean SAUNDERS, MD  furosemide (LASIX) 40 MG tablet Take 40 mg by mouth 2 (two) times daily as needed for fluid or edema.    [provider]  gabapentin (NEURONTIN) 800 MG tablet Take 800 mg by mouth 3 (three) times daily as needed (pain).    [provider]  oxyCODONE -acetaminophen  (PERCOCET) 10-325 MG tablet Take 1 tablet by mouth every 8 (eight) hours as needed for pain. 07/14/23   [provider]  rivaroxaban  (XARELTO ) 20 MG TABS tablet Take 1 tablet (20 mg total) by mouth daily with supper. 12/11/23   Madireddy, Alean SAUNDERS, MD  sertraline (ZOLOFT) 100 MG tablet Take 100 mg by mouth daily. 07/12/23   [provider]  sodium chloride  (OCEAN) 0.65 % nasal spray Place 1 spray into the nose as needed for congestion.    [provider]  tamsulosin (FLOMAX) 0.4 MG CAPS capsule Take 0.4 mg by mouth daily. 06/22/23   [provider]  traZODone (DESYREL) 100 MG tablet Take 100 mg by mouth at bedtime. 06/24/23   [provider]    Allergies: Pregabalin and Codeine    Review of Systems  Constitutional: Negative.   HENT: Negative.    Respiratory: Negative.    Cardiovascular: Negative.   Gastrointestinal:  Positive for abdominal pain. Negative for abdominal distention, anal bleeding, blood in  stool, constipation, diarrhea, nausea, rectal pain and vomiting.  Genitourinary:  Positive for flank pain. Negative for decreased urine volume, difficulty urinating, dysuria, frequency, genital sores, hematuria, penile discharge, penile pain, penile swelling, scrotal swelling, testicular pain and urgency.  Skin: Negative.   Neurological: Negative.  Negative for headaches.  All other systems reviewed and are negative.   Updated Vital Signs BP 137/80 (BP Location: Right Arm)   Pulse (!) 54   Temp 98.3 F (36.8 C) (Oral)   Resp 18   SpO2 100%   Physical Exam Vitals and nursing note reviewed.   Constitutional:      General: He is not in acute distress.    Appearance: He is well-developed. He is not ill-appearing, toxic-appearing or diaphoretic.  HENT:     Head: Normocephalic and atraumatic.     Nose: Nose normal.     Mouth/Throat:     Mouth: Mucous membranes are moist.  Eyes:     Pupils: Pupils are equal, round, and reactive to light.  Cardiovascular:     Rate and Rhythm: Normal rate and regular rhythm.     Pulses: Normal pulses.          Radial pulses are 2+ on the right side and 2+ on the left side.       Dorsalis pedis pulses are 2+ on the right side and 2+ on the left side.     Heart sounds: Normal heart sounds.  Pulmonary:     Effort: Pulmonary effort is normal. No respiratory distress.     Breath sounds: Normal breath sounds.  Abdominal:     General: Bowel sounds are normal. There is no distension.     Palpations: Abdomen is soft. There is no mass.     Tenderness: There is abdominal tenderness. There is right CVA tenderness. There is no left CVA tenderness, guarding or rebound.     Hernia: No hernia is present.     Comments: Old incision right upper quadrant from prior cholecystectomy.  Diffuse tenderness right upper, mid abdomen and to right flank.  No rebound or guarding.  No large midline pulsatile abdominal mass  Musculoskeletal:        General: No swelling, tenderness, deformity or signs of injury. Normal range of motion.     Cervical back: Normal range of motion and neck supple.     Right lower leg: No edema.     Left lower leg: No edema.  Skin:    General: Skin is warm and dry.     Capillary Refill: Capillary refill takes less than 2 seconds.     Comments: No overlying skin just to abdominal, flank wall  Neurological:     General: No focal deficit present.     Mental Status: He is alert and oriented to person, place, and time.     Cranial Nerves: No cranial nerve deficit.     Sensory: No sensory deficit.     Motor: No weakness.     Gait: Gait normal.     (all labs ordered are listed, but only abnormal results are displayed) Labs Reviewed  URINALYSIS, ROUTINE W REFLEX MICROSCOPIC - Abnormal; Notable for the following components:      Result Value   APPearance HAZY (*)    Hgb urine dipstick SMALL (*)    Bacteria, UA RARE (*)    All other components within normal limits  COMPREHENSIVE METABOLIC PANEL WITH GFR - Abnormal; Notable for the following components:   Glucose, Bld 115 (*)  Calcium  8.8 (*)    Total Protein 6.3 (*)    Albumin 3.4 (*)    All other components within normal limits  CBC WITH DIFFERENTIAL/PLATELET - Abnormal; Notable for the following components:   Platelets 130 (*)    All other components within normal limits  LIPASE, BLOOD    EKG: None  Radiology: CT Angio Chest/Abd/Pel for Dissection W and/or Wo Contrast Result Date: 08/01/2024 EXAM: CTA CHEST, ABDOMEN AND PELVIS WITH AND WITHOUT CONTRAST 08/01/2024 05:10:39 PM TECHNIQUE: CTA of the chest was performed with and without the administration of 100 mL of iohexol  (OMNIPAQUE ) 350 MG/ML injection. CTA of the abdomen and pelvis was performed with and without the administration of 100 mL of iohexol  (OMNIPAQUE ) 350 MG/ML injection. Multiplanar reformatted images are provided for review. MIP images are provided for review. Automated exposure control, iterative reconstruction, and/or weight based adjustment of the mA/kV was utilized to reduce the radiation dose to as low as reasonably achievable. COMPARISON: Abdominal pelvic CT dated 07/07/2024. CTA chest dated 03/01/2024 from [facility]. CLINICAL HISTORY: Aortic aneurysm suspected; known AAA, worsening right flank, abdominal pain. Patient stated, I went to [facility] and they sent me to [facility] and did a scan and saw nothing but blood in my urine, the pain has gotten worse and worse. At 6:30 I took Oxycodone  15 mg, it was medication from a previous back surgery. FINDINGS: VASCULATURE: AORTA: Aortic atherosclerosis.  No thoracic aortic aneurysm or dissection. No intramural hematoma on noncontrast imaging of the chest. Infrarenal abdominal aortic dilatation with wall thrombus estimated at 3.7 cm on coronal image 80 of series 9, extending into the common iliac arteries including at 1.6 cm on the left. No surrounding hemorrhage. No evidence of dissection. PULMONARY ARTERIES: No central pulmonary embolism on this nondedicated exam. GREAT VESSELS OF AORTIC ARCH: No acute finding. No dissection. No arterial occlusion or significant stenosis. CELIAC TRUNK: No acute finding. No occlusion or significant stenosis. SUPERIOR MESENTERIC ARTERY: Moderate proximal SMA stenosis including on sagittal image 108 of series 10. INFERIOR MESENTERIC ARTERY: No acute finding. No occlusion or significant stenosis. RENAL ARTERIES: Accessory right renal artery. No occlusion or significant stenosis. ILIAC ARTERIES: Extension of infrarenal abdominal aortic dilatation into the common iliac arteries including at 1.6 cm on the left. Atherosclerosis throughout the pelvic vasculature. Mild atherosclerotic narrowing within both internal iliac arteries. CHEST: MEDIASTINUM: Multiple small middle mediastinal nodes. Prominent hilar nodal tissue without well-defined adenopathy. The heart and pericardium demonstrate no acute abnormality. LAD coronary artery calcification. Aortic valve calcification. LUNGS AND PLEURA: Mild centrilobular and paraseptal emphysema. No focal consolidation or pulmonary edema. No evidence of pleural effusion or pneumothorax. THORACIC BONES AND SOFT TISSUES: No acute bone or soft tissue abnormality. ABDOMEN AND PELVIS: LIVER: The liver is unremarkable. GALLBLADDER AND BILE DUCTS: Cholecystectomy. No biliary ductal dilatation. SPLEEN: The spleen is unremarkable. PANCREAS: The pancreas is unremarkable. ADRENAL GLANDS: Bilateral adrenal glands demonstrate no acute abnormality. KIDNEYS, URETERS AND BLADDER: Artifact degradation involving the  abdominal retroperitoneum secondary to lumbar spine fixation artifact. No gross renal abnormality or hydronephrosis. Urinary bladder is unremarkable. GI AND BOWEL: Stomach and duodenal sweep demonstrate no acute abnormality. There is no bowel obstruction. No abnormal bowel wall thickening or distension. Extensive colonic diverticulosis. Wall thickening within the mid sigmoid colon including on 11/20/2004, most likely related to muscular hypertrophy. Colonic stool burden suggests constipation. Normal terminal ileum. Appendix not visualized. Normal small bowel. REPRODUCTIVE: Mild prostatomegaly. PERITONEUM AND RETROPERITONEUM: No free fluid or air. LYMPH NODES: No lymphadenopathy. ABDOMINAL  BONES AND SOFT TISSUES: Posterior right iliac sclerotic lesion is likely a bone island. L2 to L4 transpedicular screw fixation. Underlying mild superior endplate compression deformity at L4. No acute soft tissue abnormality. IMPRESSION: 1. Infrarenal abdominal aortic aneurysm, which is relatively stable at 3.7 . cm with no evidence of surrounding hemorrhage and no evidence of dissection. 2. Colonic stool burden suggests constipation. 3. Diverticulosis with sigmoid wall thickening, most likely related to muscular hypertrophy; if the patient is not up to date on colon cancer screening, consider colonoscopy to exclude polyp in this region. 4. Aortic atherosclerosis (ICD-10 I70.0), coronary artery atherosclerosis, and emphysema (ICD-10 J43.9). 5. Aortic valve calcification. consider echocardiography to further evaluate. Electronically signed by: Rockey Kilts MD 08/01/2024 06:07 PM EDT RP Workstation: [id number]     Procedures   Medications Ordered in the ED  methocarbamol (ROBAXIN) tablet 500 mg (500 mg Oral Patient Refused/Not Given 08/01/24 1830)  ketorolac  (TORADOL ) 15 MG/ML injection 15 mg (15 mg Intravenous Patient Refused/Not Given 08/01/24 1830)  HYDROcodone -acetaminophen  (NORCO/VICODIN) 5-325 MG per tablet 1 tablet (1  tablet Oral Given 08/01/24 1117)  oxyCODONE -acetaminophen  (PERCOCET/ROXICET) 5-325 MG per tablet 1 tablet (1 tablet Oral Given 08/01/24 1410)  iohexol  (OMNIPAQUE ) 350 MG/ML injection 100 mL (100 mLs Intravenous Contrast Given 08/01/24 3970)   58 year old here for evaluation right side abdominal pain flank pain started on Tuesday.  Initially seen in urgent care, [facility], scans did not show anything at that point according to patient.  He had a urinalysis which showed some blood.  He is subsequently discharged home.  Has had continued pain.  He denies any chest pain, shortness of breath.  No dysuria or obvious hematuria.  He has a known AAA.  He denies any recent falls or injuries.  Pain worse with movement, improves with laying flat.  He is chronically anticoagulated.  He does not appear grossly fluid overloaded, no medical evidence of VTE on exam.  Plan labs, imaging, reassess.  Labs and imaging personally viewed and interpreted:  CBC without leukocytosis CMP without significant abnormality Lipase 22 UA neg for infection EKG without ischemic changes CT dissection study with stable aneurysm.  Does show constipation.  Patient reassessed.  I suspect this pain likely MSK in etiology.  Will DC home with muscle relaxers, lidocaine  patches.  Low suspicion for acute ACS, PE, dissection, perforation, obstruction, choledocholithiasis, kidney stone, pyelonephritis, pneumonia, esophageal rupture/pneumomediastinum, pending AAA rupture.  Seen by attending Dr. Neysa agreeable with plan and dispo.  Patient ambulatory here, tolerating PO intake here, NV intact   Labs, imaging and vitals reviewed.  Patient does not meet the SIRS or Sepsis criteria.  On repeat exam patient does not have a surgical abdomin and there are no peritoneal signs. Patient discharged home with symptomatic treatment and given strict instructions for follow-up with their primary care physician.  I have also discussed reasons to  return immediately to the ER.  Patient expresses understanding and agrees with plan.  The patient has been appropriately medically screened and/or stabilized in the ED. I have low suspicion for any other emergent medical condition which would require further screening, evaluation or treatment in the ED or require inpatient management.  Patient is hemodynamically stable and in no acute distress.  Patient able to ambulate in department prior to ED.  Evaluation does not show acute pathology that would require ongoing or additional emergent interventions while in the emergency department or further inpatient treatment.  I have discussed the diagnosis with the patient and answered all questions.  Pain is been managed while in the emergency department and patient has no further complaints prior to discharge.  Patient is comfortable with plan discussed in room and is stable for discharge at this time.  I have discussed strict return precautions for returning to the emergency department.  Patient was encouraged to follow-up with PCP/specialist refer to at discharge.                                     Medical Decision Making Amount and/or Complexity of Data Reviewed External Data Reviewed: labs, radiology and notes. Labs: ordered. Decision-making details documented in ED Course. Radiology: ordered and independent interpretation performed. Decision-making details documented in ED Course. ECG/medicine tests: ordered and independent interpretation performed. Decision-making details documented in ED Course.  Risk OTC drugs. Prescription drug management. Decision regarding hospitalization. Diagnosis or treatment significantly limited by social determinants of health.       Final diagnoses:  Right flank pain  Constipation, unspecified constipation type    ED Discharge Orders          Ordered    methocarbamol (ROBAXIN) 500 MG tablet  2 times daily        08/01/24 1812    lidocaine  (LIDODERM ) 5  %  Every 24 hours        08/01/24 1812               Mehul Rudin A, PA-C 08/01/24 1855    Neysa Caron PARAS, DO 08/01/24 1900

## 2024-08-01 NOTE — Discharge Instructions (Signed)
 Your workup today was reassuring.  Your CTs can did show constipation. May take Miralax 1-2 scoopful in 8 oz liquid of your choice daily, can increase as needed  I have written you for some muscle relaxers.  Take as prescribed as well as some Lidoderm  patches.  Make sure to follow-up outpatient, return for new or worsening symptoms

## 2024-08-01 NOTE — ED Notes (Signed)
 NT informed pt having intense flank pain that radiates up pt back. RN notified EDP
